# Patient Record
Sex: Female | Born: 1935 | Race: White | Hispanic: No | Smoking: Former smoker
Health system: Southern US, Community
[De-identification: ages and names within clinical notes are randomized; demographics above are authoritative.]

## PROBLEM LIST (undated history)

## (undated) DIAGNOSIS — I10 Essential (primary) hypertension: Secondary | ICD-10-CM

## (undated) DIAGNOSIS — N390 Urinary tract infection, site not specified: Secondary | ICD-10-CM

## (undated) DIAGNOSIS — K219 Gastro-esophageal reflux disease without esophagitis: Secondary | ICD-10-CM

## (undated) DIAGNOSIS — M1990 Unspecified osteoarthritis, unspecified site: Secondary | ICD-10-CM

## (undated) DIAGNOSIS — B159 Hepatitis A without hepatic coma: Secondary | ICD-10-CM

## (undated) DIAGNOSIS — F419 Anxiety disorder, unspecified: Secondary | ICD-10-CM

## (undated) DIAGNOSIS — I480 Paroxysmal atrial fibrillation: Secondary | ICD-10-CM

## (undated) DIAGNOSIS — I5042 Chronic combined systolic (congestive) and diastolic (congestive) heart failure: Secondary | ICD-10-CM

## (undated) DIAGNOSIS — K56609 Unspecified intestinal obstruction, unspecified as to partial versus complete obstruction: Secondary | ICD-10-CM

## (undated) DIAGNOSIS — R233 Spontaneous ecchymoses: Secondary | ICD-10-CM

## (undated) DIAGNOSIS — R238 Other skin changes: Secondary | ICD-10-CM

## (undated) HISTORY — DX: Hepatitis a without hepatic coma: B15.9

## (undated) HISTORY — DX: Unspecified osteoarthritis, unspecified site: M19.90

## (undated) HISTORY — PX: CATARACT EXTRACTION: SUR2

## (undated) HISTORY — DX: Anxiety disorder, unspecified: F41.9

## (undated) HISTORY — PX: DILATATION & CURRETTAGE/HYSTEROSCOPY WITH RESECTOCOPE: SHX5572

## (undated) HISTORY — DX: Unspecified intestinal obstruction, unspecified as to partial versus complete obstruction: K56.609

## (undated) HISTORY — DX: Urinary tract infection, site not specified: N39.0

---

## 1999-09-05 ENCOUNTER — Encounter: Admission: RE | Admit: 1999-09-05 | Discharge: 1999-09-05 | Payer: Self-pay | Admitting: Family Medicine

## 1999-09-05 ENCOUNTER — Encounter: Payer: Self-pay | Admitting: Family Medicine

## 2000-09-16 ENCOUNTER — Encounter: Admission: RE | Admit: 2000-09-16 | Discharge: 2000-09-16 | Payer: Self-pay | Admitting: Family Medicine

## 2000-09-16 ENCOUNTER — Encounter: Payer: Self-pay | Admitting: Family Medicine

## 2001-04-07 ENCOUNTER — Encounter: Admission: RE | Admit: 2001-04-07 | Discharge: 2001-04-07 | Payer: Self-pay | Admitting: Family Medicine

## 2001-04-07 ENCOUNTER — Encounter: Payer: Self-pay | Admitting: Family Medicine

## 2001-06-29 ENCOUNTER — Encounter: Admission: RE | Admit: 2001-06-29 | Discharge: 2001-06-29 | Payer: Self-pay | Admitting: Family Medicine

## 2001-06-29 ENCOUNTER — Encounter: Payer: Self-pay | Admitting: Family Medicine

## 2001-09-17 ENCOUNTER — Encounter: Admission: RE | Admit: 2001-09-17 | Discharge: 2001-09-17 | Payer: Self-pay | Admitting: Family Medicine

## 2001-09-17 ENCOUNTER — Encounter: Payer: Self-pay | Admitting: Family Medicine

## 2002-03-30 ENCOUNTER — Observation Stay (HOSPITAL_COMMUNITY): Admission: EM | Admit: 2002-03-30 | Discharge: 2002-03-31 | Payer: Self-pay

## 2002-03-30 ENCOUNTER — Encounter: Payer: Self-pay | Admitting: Cardiology

## 2002-03-31 ENCOUNTER — Encounter: Payer: Self-pay | Admitting: Cardiology

## 2002-09-20 ENCOUNTER — Encounter: Admission: RE | Admit: 2002-09-20 | Discharge: 2002-09-20 | Payer: Self-pay | Admitting: Family Medicine

## 2002-09-20 ENCOUNTER — Encounter: Payer: Self-pay | Admitting: Family Medicine

## 2003-06-05 ENCOUNTER — Encounter: Admission: RE | Admit: 2003-06-05 | Discharge: 2003-06-05 | Payer: Self-pay | Admitting: Family Medicine

## 2003-10-03 ENCOUNTER — Encounter (INDEPENDENT_AMBULATORY_CARE_PROVIDER_SITE_OTHER): Payer: Self-pay | Admitting: Cardiology

## 2003-10-03 ENCOUNTER — Ambulatory Visit (HOSPITAL_COMMUNITY): Admission: RE | Admit: 2003-10-03 | Discharge: 2003-10-03 | Payer: Self-pay | Admitting: Cardiology

## 2003-11-16 ENCOUNTER — Encounter: Admission: RE | Admit: 2003-11-16 | Discharge: 2003-11-16 | Payer: Self-pay | Admitting: Family Medicine

## 2003-11-26 ENCOUNTER — Emergency Department (HOSPITAL_COMMUNITY): Admission: EM | Admit: 2003-11-26 | Discharge: 2003-11-26 | Payer: Self-pay | Admitting: *Deleted

## 2003-12-12 ENCOUNTER — Encounter: Admission: RE | Admit: 2003-12-12 | Discharge: 2003-12-12 | Payer: Self-pay | Admitting: Family Medicine

## 2003-12-14 ENCOUNTER — Encounter: Admission: RE | Admit: 2003-12-14 | Discharge: 2003-12-14 | Payer: Self-pay | Admitting: Family Medicine

## 2003-12-20 ENCOUNTER — Encounter: Admission: RE | Admit: 2003-12-20 | Discharge: 2003-12-20 | Payer: Self-pay | Admitting: Family Medicine

## 2004-03-25 ENCOUNTER — Encounter: Admission: RE | Admit: 2004-03-25 | Discharge: 2004-03-25 | Payer: Self-pay | Admitting: Family Medicine

## 2004-04-23 ENCOUNTER — Encounter: Admission: RE | Admit: 2004-04-23 | Discharge: 2004-04-23 | Payer: Self-pay | Admitting: Family Medicine

## 2004-05-09 ENCOUNTER — Ambulatory Visit (HOSPITAL_COMMUNITY): Admission: RE | Admit: 2004-05-09 | Discharge: 2004-05-09 | Payer: Self-pay | Admitting: Family Medicine

## 2004-05-09 ENCOUNTER — Encounter (INDEPENDENT_AMBULATORY_CARE_PROVIDER_SITE_OTHER): Payer: Self-pay | Admitting: Specialist

## 2004-10-11 ENCOUNTER — Encounter: Admission: RE | Admit: 2004-10-11 | Discharge: 2004-10-11 | Payer: Self-pay | Admitting: Family Medicine

## 2005-01-01 ENCOUNTER — Encounter: Admission: RE | Admit: 2005-01-01 | Discharge: 2005-01-01 | Payer: Self-pay | Admitting: Family Medicine

## 2005-11-10 ENCOUNTER — Encounter: Admission: RE | Admit: 2005-11-10 | Discharge: 2005-11-10 | Payer: Self-pay | Admitting: Family Medicine

## 2006-02-05 ENCOUNTER — Encounter: Admission: RE | Admit: 2006-02-05 | Discharge: 2006-02-05 | Payer: Self-pay | Admitting: Family Medicine

## 2006-05-28 ENCOUNTER — Encounter: Admission: RE | Admit: 2006-05-28 | Discharge: 2006-05-28 | Payer: Self-pay | Admitting: Family Medicine

## 2008-10-30 ENCOUNTER — Emergency Department (HOSPITAL_COMMUNITY): Admission: EM | Admit: 2008-10-30 | Discharge: 2008-10-30 | Payer: Self-pay | Admitting: Family Medicine

## 2010-09-13 NOTE — Discharge Summary (Signed)
[name], VANDERWEELE                          ACCOUNT NO.:  [id number]   MEDICAL RECORD NO.:  [id number]                   PATIENT TYPE:  INP   LOCATION:  2003                                 FACILITY:  [facility]   PHYSICIAN:  Darden Palmer., M.D.         DATE OF BIRTH:  1921-02-19   DATE OF ADMISSION:  03/30/2002  DATE OF DISCHARGE:  03/31/2002                                 DISCHARGE SUMMARY   FINAL DIAGNOSES:  1. Accelerated blood pressure resolved.  2. Atypical palpitations and malaise and fatigue of uncertain cause.  3. Abnormal EKG likely due to baseline artifact - myocardial infarction     ruled out.  Cardiolite scan unremarkable.  4. Anxiety.  5. Osteoarthritis and cervical osteoarthritis.   PROCEDURES:  Carotid Doppler scan and stress Cardiolite.   HISTORY OF PRESENT ILLNESS:  This 77 year old female has a history of  arrhythmias treated with beta-blockers previously.  She has complained of  significant neck and shoulder pain and has had increased anxiety and stress.  She has also complained of some malaise and fatigue recently.  She had not  felt well in some time and awoke feeling well the morning of admission but  then felt weird with increased shortness of breath and irregular heart  rate. She took her blood pressure and noted it to be elevated and then went  to Dr. Quita Skye office feeling very anxious and shaking.  While there, he  did an EKG showing ST elevation that was reported in the anterior leads and  she was sent to the emergency room.  At no time did she have any chest pain,  pressure, tightness or heaviness.  Repeat EKG here did not show ST elevation  and she was admitted to rule out a myocardial infarction.  Please see the  previously dictated history and physical for the remainder of the details.   HOSPITAL COURSE:  The patient was admitted to the telemetry floor and she  was given a dose of Norvasc and her blood pressure medicines were  increased.  With this, her systolic blood pressure came down to the 150 range.  Laboratory data on admission showed a hemoglobin of 13.1, hematocrit 38,  platelet count of 247,000.  Chemistry panel showed a sodium of 130,  potassium 3.6, chloride 95, CO2 25, BUN 10, creatinine 0.5 and glucose of  135.  PT and PTT were normal.  Liver enzymes were normal.  Cardiac enzymes  showed normal CPK, MB was 3.7, initially was 4.1 and 4.5 which were  borderline, troponin was 0.01, 0.03 and 0.02 which were within normal  limits.  Cholesterol 212, triglycerides 80, HDL 72, ratio 2.0, LDL  cholesterol 124 and VLDL was 16.  TSH was 1.5.   The patient was admitted and underwent treatment for her blood pressure. She  did not have recurrent chest pain at any time.  Serial EKGs failed to evolve  a myocardial infarction.  Her blood pressure was better controlled the next  day but she continued to complain of neck pain and was sent down for  cervical spine films which are pending at the time of dictation.  She had an  adenosine Cardiolite scan done the next day which showed an ejection  fraction of 74%. There were no wall motion abnormalities and there was no  evidence of ischemia.  Carotid Doppler studies were done because of some  faint bruits that were noted and showed tortuous carotids but only mild  luminal irregularity within the carotid arteries.   It was recommended that she be discharged the next day.  She continues to  have some anxiety and does have significant pain involving her neck with  motion in her shoulders.  We felt at the present time that she did not show  a high risk pattern for coronary disease.   CONDITION ON DISCHARGE:  She was discharged in improved condition.   DISCHARGE MEDICATIONS:  1. Hydrochlorothiazide 25 mg daily.  2. Atenolol 50 mg daily.  3. Norvasc 5/20 b.i.d.  4. She is also to use meprobamate and Fiorinal as needed for pain and for     headache.   She also has  severe osteoarthritis of her knees and it would be helpful if  she could benefit in some exercises and is advised to lose weight.   FOLLOW UP:  I will see her in follow-up in one week and she is to follow up  further with Dr. Clovis Riley.  She likely will require higher doses of  medications for control of her blood pressure with an ideal blood pressure  of 130/80 her last.                                               W. Ashley Royalty., M.D.    WST/MEDQ  D:  03/31/2002  T:  04/01/2002  Job:  [id number]   cc:   L. Lupe Carney, M.D.  [address] [place]  [place] [address]  Fax: [phone]

## 2010-09-13 NOTE — H&P (Signed)
[name], [name]                          ACCOUNT NO.:  [id number]   MEDICAL RECORD NO.:  [id number]                   PATIENT TYPE:  EMS   LOCATION:  MINO                                 FACILITY:  [facility]   PHYSICIAN:  W. Ashley Royalty., M.D.         DATE OF BIRTH:  June 21, 1920   DATE OF ADMISSION:  03/30/2002  DATE OF DISCHARGE:                                HISTORY & PHYSICAL   HISTORY OF PRESENT ILLNESS:  This very nice, 77 year old female, is seen at  the request of Dr. Clovis Riley for cardiological evaluation regarding atypical  symptoms and an abnormal EKG.  She has a long-standing history of  hypertensive heart disease and was evaluated by me seven years ago with  palpitations which were thought due to supraventricular arrhythmias.  She  was treated with Beta blockers which have largely controlled her symptoms.  Over the past several months, she has had some increased dyspnea on exertion  and fatigue with exercise, and has also complained of neck pain which has  been more like a crick in her neck, and also mid back pain which would be  worse if she used her arms or did activity.  She has not had any significant  chest pain.  Her blood pressure has also been somewhat elevated recently and  she recently had an increase in her hydrochlorothiazide by Dr. Clovis Riley at  his office.   She awoke this morning and did not feel well after arising, and felt  weird.  She took her blood pressure and noted it to be significantly  elevated and noted a significant irregular pulse rate.  She felt poorly and  went to Dr. Quita Skye office where an EKG showed anterior ST elevation.  At  the time; however, she was not having any chest pain, pressure or tightness  suggestive of angina.  Dr. Quita Skye office had her come to the [facility]  Emergency Room where I have evaluated her, a repeat EKG here did not show ST  elevation anteriorly.  She is admitted at this time to rule out an atypical  presentation of a myocardial infarction and to treat elevated blood  pressure.   PAST HISTORY:  Remarkable for hypertension since her 30s.  There is no  history of diabetes or ulcers.  Her cholesterol status is unknown.   PREVIOUS SURGERY:  D&C x2.   ALLERGIES:  To sulfa.  She is also allergic to Neosporin and some eye drops.   CURRENT MEDICATIONS:  1. DynaCirc CR, 10 mg daily.  2. HCTZ 25 daily.  3. Atenolol 50 mg daily.   She also had previously been tried on Lotrel 5/10, _________ , Toprol and  Procardia for blood pressure, but is not currently taking any of these  medications.   FAMILY HISTORY:  Father died of a stroke at age 72, mother died of heart  failure at age 60, one brother living and well at 60.  No family history of  premature cardiac disease.   SOCIAL HISTORY:  The patient is a widow and currently lives alone.  She is a  non-smoker and does not use alcohol to excess.  She attends [employer].   REVIEW OF SYSTEMS:  GENERAL:  She has been obese for many years.  She has  chronic rosacea of her skin.  ENT:  She has a prior history of bilateral  cataract extractions, and also has had laser surgery and reports having a  cyst on the back of her left retina.  No eye, ear, nose or throat problem.  She has modest dyspnea on exertion, but no significant pulmonary problems.  She does have seasonal allergies that she would take Fiorinal for.  She does  have a history of anxiety for which she take meprobamate.  There are  significant gastrointestinal problems.  No claudication.  She has  significant arthritis of her knees and has been told that she needs a knee  replacement.  No neurologic symptoms or problems.  The remainder of the  review of systems is unremarkable.   PHYSICAL EXAMINATION:  GENERAL:  She is a pleasant, elderly woman, appearing  slightly younger than stated age.  VITAL SIGNS:  Her blood pressure is currently 200/85.  Pulse is currently  70  and regular.  SKIN:  Warm and dry with some erythema and rosacea of her face.  ENT:  Wears glasses.  EOMI, PERLA.  CNS:  Clear.  Funduscopic unremarkable.  Pharynx negative.  NECK:  Supple, without masses.  No JVD or thyromegaly.  There are bilateral  bruits noted.  LUNGS:  Clear bilaterally.  CARDIAC:  Shows normal S1 and S2, there is no S3, S4 or murmur.  ABDOMEN:  Soft, nontender, no masses or organomegaly.  No aneurysm or  bruits.  Femoral pulses are 2+ without bruits.  Peripheral pulses are 2+,  there is no edema noted.  NEUROLOGICAL:  Normal.  GU/RECTAL:  Deferred due to cardiac condition.   STUDIES:  12-lead ECG reviewed from Dr. Quita Skye office shows ST elevation  anteriorly with significant baseline artifact.  Repeat EKG did not show any  abnormality except for ST depression in the V2 and V3.   IMPRESSION:  1. Abnormal EKG possibly due to baseline artifact, rule out an atypical     presentation for myocardial infarction or ischemia.  2. Essential hypertension which is currently uncontrolled.  3. Obesity.  4. Significant osteoarthritis.   RECOMMENDATIONS:  She will be admitted to rule out a myocardial infarction.  We will add an Ace inhibitor and increase her blood pressure medicines at  this time.  Try to control blood pressure, rule out MI, check EKG and lipid  panel.                                                Darden Palmer., M.D.    WST/MEDQ  D:  03/30/2002  T:  03/30/2002  Job:  [id number]   cc:   L. Lupe Carney, M.D.  [address] [place]  [place] [address]  Fax: [phone]

## 2012-04-28 DIAGNOSIS — K56609 Unspecified intestinal obstruction, unspecified as to partial versus complete obstruction: Secondary | ICD-10-CM

## 2012-04-28 HISTORY — DX: Unspecified intestinal obstruction, unspecified as to partial versus complete obstruction: K56.609

## 2012-04-30 ENCOUNTER — Encounter (HOSPITAL_COMMUNITY): Payer: Self-pay | Admitting: Emergency Medicine

## 2012-04-30 ENCOUNTER — Inpatient Hospital Stay (HOSPITAL_COMMUNITY)
Admission: EM | Admit: 2012-04-30 | Discharge: 2012-05-17 | DRG: 388 | Disposition: A | Discharge status: Skilled Nursing Facility | Payer: Medicare Other | Attending: Internal Medicine | Admitting: Internal Medicine

## 2012-04-30 DIAGNOSIS — Z79899 Other long term (current) drug therapy: Secondary | ICD-10-CM

## 2012-04-30 DIAGNOSIS — J189 Pneumonia, unspecified organism: Secondary | ICD-10-CM | POA: Diagnosis present

## 2012-04-30 DIAGNOSIS — K589 Irritable bowel syndrome without diarrhea: Secondary | ICD-10-CM | POA: Diagnosis present

## 2012-04-30 DIAGNOSIS — E876 Hypokalemia: Secondary | ICD-10-CM | POA: Diagnosis present

## 2012-04-30 DIAGNOSIS — R339 Retention of urine, unspecified: Secondary | ICD-10-CM | POA: Diagnosis not present

## 2012-04-30 DIAGNOSIS — E871 Hypo-osmolality and hyponatremia: Secondary | ICD-10-CM | POA: Diagnosis present

## 2012-04-30 DIAGNOSIS — Z66 Do not resuscitate: Secondary | ICD-10-CM | POA: Diagnosis present

## 2012-04-30 DIAGNOSIS — K5900 Constipation, unspecified: Secondary | ICD-10-CM | POA: Diagnosis present

## 2012-04-30 DIAGNOSIS — W19XXXA Unspecified fall, initial encounter: Secondary | ICD-10-CM | POA: Diagnosis present

## 2012-04-30 DIAGNOSIS — Z7982 Long term (current) use of aspirin: Secondary | ICD-10-CM

## 2012-04-30 DIAGNOSIS — K56609 Unspecified intestinal obstruction, unspecified as to partial versus complete obstruction: Principal | ICD-10-CM | POA: Diagnosis present

## 2012-04-30 DIAGNOSIS — S9030XA Contusion of unspecified foot, initial encounter: Secondary | ICD-10-CM | POA: Diagnosis present

## 2012-04-30 DIAGNOSIS — N179 Acute kidney failure, unspecified: Secondary | ICD-10-CM | POA: Diagnosis not present

## 2012-04-30 DIAGNOSIS — D72829 Elevated white blood cell count, unspecified: Secondary | ICD-10-CM | POA: Diagnosis present

## 2012-04-30 DIAGNOSIS — Y95 Nosocomial condition: Secondary | ICD-10-CM | POA: Diagnosis present

## 2012-04-30 DIAGNOSIS — R1312 Dysphagia, oropharyngeal phase: Secondary | ICD-10-CM | POA: Diagnosis present

## 2012-04-30 DIAGNOSIS — B952 Enterococcus as the cause of diseases classified elsewhere: Secondary | ICD-10-CM | POA: Diagnosis present

## 2012-04-30 DIAGNOSIS — R490 Dysphonia: Secondary | ICD-10-CM | POA: Diagnosis present

## 2012-04-30 DIAGNOSIS — I10 Essential (primary) hypertension: Secondary | ICD-10-CM | POA: Diagnosis present

## 2012-04-30 DIAGNOSIS — R7881 Bacteremia: Secondary | ICD-10-CM | POA: Diagnosis present

## 2012-04-30 DIAGNOSIS — N390 Urinary tract infection, site not specified: Secondary | ICD-10-CM | POA: Diagnosis present

## 2012-04-30 DIAGNOSIS — B967 Clostridium perfringens [C. perfringens] as the cause of diseases classified elsewhere: Secondary | ICD-10-CM | POA: Diagnosis present

## 2012-04-30 DIAGNOSIS — R197 Diarrhea, unspecified: Secondary | ICD-10-CM | POA: Diagnosis not present

## 2012-04-30 DIAGNOSIS — E870 Hyperosmolality and hypernatremia: Secondary | ICD-10-CM | POA: Diagnosis present

## 2012-04-30 DIAGNOSIS — A498 Other bacterial infections of unspecified site: Secondary | ICD-10-CM | POA: Diagnosis present

## 2012-04-30 HISTORY — DX: Essential (primary) hypertension: I10

## 2012-04-30 MED ORDER — SODIUM CHLORIDE 0.9 % IV BOLUS (SEPSIS)
500.0000 mL | Freq: Once | INTRAVENOUS | Status: AC
  Administered 2012-05-01: 500 mL via INTRAVENOUS

## 2012-04-30 NOTE — ED Notes (Signed)
MD at bedside. 

## 2012-04-30 NOTE — ED Notes (Signed)
Per family n/v since 1600 today. Daughter gave Phenergan PR @ 2000 tonight

## 2012-04-30 NOTE — ED Provider Notes (Signed)
History     CSN: [id number]  Arrival date & time 04/30/12  2220   First MD Initiated Contact with Patient 04/30/12 2339      Chief Complaint  Patient presents with  . Emesis    (Consider location/radiation/quality/duration/timing/severity/associated sxs/prior treatment) Patient is a 79 y.o. female presenting with vomiting. The history is provided by the patient.  Emesis  This is a new problem. Pertinent negatives include no abdominal pain, no diarrhea and no headaches.   patient presents with vomiting. It began around 4:00 today. No diarrhea. No constipation her son states her abdomen is gotten a little larger. Patient feels it is not. No chest pain. No lightheadedness or dizziness. She does feel a little fatigued. No dysuria. No frequency. No headache. No confusion. No clear sick contacts. She did have a fall around Christmastime and has swelling on her right foot since. She was given Phenergan rectally tonight and still has some nausea.  Past Medical History  Diagnosis Date  . Hypertension   . Irregular heart beat     Past Surgical History  Procedure Date  . Cataract extraction     Family History  Problem Relation Age of Onset  . Heart disease Mother   . Stroke Father     History  Substance Use Topics  . Smoking status: Never Smoker   . Smokeless tobacco: Not on file  . Alcohol Use: No    OB History    Grav Para Term Preterm Abortions TAB SAB Ect Mult Living                  Review of Systems  Constitutional: Positive for fatigue. Negative for activity change and appetite change.  HENT: Negative for neck stiffness.   Eyes: Negative for pain.  Respiratory: Negative for chest tightness and shortness of breath.   Cardiovascular: Negative for chest pain and leg swelling.  Gastrointestinal: Positive for nausea and vomiting. Negative for abdominal pain and diarrhea.  Genitourinary: Negative for flank pain.  Musculoskeletal: Negative for back pain.  Skin: Negative  for rash.  Neurological: Negative for weakness, numbness and headaches.  Psychiatric/Behavioral: Negative for behavioral problems.    Allergies  Neosporin and Sulfa antibiotics  Home Medications   Current Outpatient Rx  Name  Route  Sig  Dispense  Refill  . ALPRAZOLAM 0.5 MG PO TABS   Oral   Take 0.25 mg by mouth 2 (two) times daily as needed. anxiety         . AMLODIPINE BESYLATE PO   Oral   Take 5 mg by mouth daily.          . ASPIRIN 81 MG PO TABS   Oral   Take 81 mg by mouth daily.         . ATENOLOL PO   Oral   Take 50 mg by mouth 2 (two) times daily.          Marland Kitchen GLUCOSAMINE COMPLEX PO   Oral   Take 1 tablet by mouth daily.          Marland Kitchen CETIRIZINE HCL 10 MG PO TABS   Oral   Take 10 mg by mouth daily.         Marland Kitchen VITAMIN B 12 PO   Oral   Take by mouth.         Marland Kitchen HYDROCODONE-ACETAMINOPHEN 5-325 MG PO TABS   Oral   Take 1 tablet by mouth every 6 (six) hours as needed. pain         .  SPIRONOLACTONE-HCTZ 25-25 MG PO TABS   Oral   Take 1 tablet by mouth daily.         Marland Kitchen DIOVAN PO   Oral   Take 160 mg by mouth daily.            BP 124/66  Pulse 101  Temp 99.7 F (37.6 C) (Oral)  Resp 27  Ht 5\' 4"  (1.626 m)  Wt 145 lb (65.772 kg)  BMI 24.89 kg/m2  SpO2 98%  Physical Exam  Nursing note and vitals reviewed. Constitutional: She is oriented to person, place, and time. She appears well-developed and well-nourished.  HENT:  Head: Normocephalic and atraumatic.  Eyes: EOM are normal. Pupils are equal, round, and reactive to light.  Neck: Normal range of motion. Neck supple.  Cardiovascular: Normal rate, regular rhythm and normal heart sounds.   No murmur heard. Pulmonary/Chest: Effort normal and breath sounds normal. No respiratory distress. She has no wheezes. She has no rales.  Abdominal: Soft. Bowel sounds are normal. She exhibits distension. There is no tenderness. There is no rebound and no guarding.       Mild distention    Musculoskeletal: Normal range of motion.       Swelling to dorsum of right foot. Bruising. Strength intact. Motor intact.  Neurological: She is alert and oriented to person, place, and time. No cranial nerve deficit.  Skin: Skin is warm and dry.  Psychiatric: She has a normal mood and affect. Her speech is normal.    ED Course  Procedures (including critical care time)  Labs Reviewed  CBC WITH DIFFERENTIAL - Abnormal; Notable for the following:    WBC 15.1 (*)     HCT 35.1 (*)     Neutrophils Relative 78 (*)     Neutro Abs 11.8 (*)     Lymphocytes Relative 7 (*)     Monocytes Relative 15 (*)     Monocytes Absolute 2.3 (*)     All other components within normal limits  URINALYSIS, ROUTINE W REFLEX MICROSCOPIC - Abnormal; Notable for the following:    Protein, ur 100 (*)     Leukocytes, UA TRACE (*)     All other components within normal limits  COMPREHENSIVE METABOLIC PANEL - Abnormal; Notable for the following:    Sodium 124 (*)     Chloride 86 (*)     Glucose, Bld 187 (*)     Total Protein 8.4 (*)     GFR calc non Af Amer 73 (*)     GFR calc Af Amer 85 (*)     All other components within normal limits  URINE MICROSCOPIC-ADD ON - Abnormal; Notable for the following:    Squamous Epithelial / LPF FEW (*)     All other components within normal limits  LIPASE, BLOOD  TROPONIN I   Dg Chest 2 View  05/01/2012  *RADIOLOGY REPORT*  Clinical Data: Emesis, question aspiration  CHEST - 2 VIEW  Comparison: Prior radiograph of 05/01/2012  Findings: Nasogastric tube extends into stomach. Enlargement of cardiac silhouette. Calcified left upper lobe nodular density again seen. No new infiltrate or pleural effusion. Atherosclerotic calcification of a mildly tortuous thoracic aorta. Bones demineralized.  IMPRESSION: No new infiltrates identified. Enlargement of cardiac silhouette. Old granulomatous disease.   Original Report Authenticated By: Ulyses Southward, M.D.    Ct Abdomen Pelvis W  Contrast  05/01/2012  *RADIOLOGY REPORT*  Clinical Data: Nausea, vomiting, question small bowel obstruction, abnormal radiographs  CT ABDOMEN AND  PELVIS WITH CONTRAST  Technique:  Multidetector CT imaging of the abdomen and pelvis was performed following the standard protocol during bolus administration of intravenous contrast. Sagittal and coronal MPR images reconstructed from axial data set.  Contrast: OMNIPAQUE IOHEXOL 300 MG/ML  SOLN Dilute oral contrast.  Comparison: Abdominal radiographs 05/01/2012  Findings: Minimal atelectasis right lung base. Moderate sized hiatal hernia. Small cyst upper pole right kidney. Liver, spleen, pancreas, kidneys, and adrenal glands otherwise unremarkable. Dilatation of stomach and proximal small bowel loops with decompressed distal small bowel loops compatible with small bowel obstruction. Dilatation of small bowel loops terminates in the upper mid pelvis anteriorly image 62. Small bowel stool sign is identified within the dilated distal small bowel loops. Question minimal bowel wall thickening at the point of obstruction. Stool is present throughout the colon and rectum.  No additional bowel wall thickening, free fluid, or free air. Unremarkable bladder, ureters, and adnexae. Small fat containing lipoleiomyoma within uterus. Normal appendix. No mass, adenopathy or hernia. Bones appear demineralized. Scattered atherosclerotic calcifications. Degenerative disc disease changes lumbar spine with superior endplate compression fracture of T11, age indeterminate.  IMPRESSION: Small bowel obstruction with transition zone in the anterior central pelvis, associated with bowel wall thickening at the point of obstruction. This could represent an adhesion though subtle mass lesion is not excluded. Increased stool throughout colon. Moderate sized hiatal hernia.   Original Report Authenticated By: Ulyses Southward, M.D.    Dg Abd Acute W/chest  05/01/2012  *RADIOLOGY REPORT*  Clinical Data:  Nausea, vomiting, abdominal pain  ACUTE ABDOMEN SERIES (ABDOMEN 2 VIEW & CHEST 1 VIEW)  Comparison: Chest radiograph 05/28/2006 Correlation:  CT chest 11/10/2005  Findings: Mild enlargement of cardiac silhouette. Atherosclerotic ossification aorta. Mediastinal contours and pulmonary vascularity otherwise normal. Emphysematous and bronchitic changes consistent with COPD. Partially calcified anterior left upper lobe nodule, little changed. No acute infiltrate, pleural effusion, or pneumothorax.  Bones appear demineralized. Dilated air-filled small bowel loops with scattered air-fluid levels on left lateral decubitus view question small bowel obstruction. Scattered stool present throughout the colon to the rectum. No definite free intraperitoneal air or definite urinary tract calcification.  IMPRESSION: Dilated air-filled loops of small bowel with scattered air-fluid levels on left lateral decubitus view question small bowel obstruction. However, stool is present throughout colon to rectum. Question COPD with old calcified granuloma left upper lobe.   Original Report Authenticated By: Ulyses Southward, M.D.    Dg Foot Complete Right  05/01/2012  *RADIOLOGY REPORT*  Clinical Data: Pain, bruising and swelling at dorsal midfoot post fall  RIGHT FOOT COMPLETE - 3+ VIEW  Comparison: None  Findings: Osseous demineralization. Minimal hallux valgus. Degenerative changes first MTP joint. Remaining joint spaces preserved. Small plantar calcaneal spur. Dorsal soft tissue swelling at mid foot. No definite acute fracture, dislocation, or bone destruction. Scattered atherosclerotic calcifications.  IMPRESSION: Osseous demineralization. Hallux valgus with mild degenerative changes first MTP joint. Dorsal soft tissue swelling midfoot without definite acute bony abnormalities.   Original Report Authenticated By: Ulyses Southward, M.D.      1. Small bowel obstruction   2. HTN (hypertension)   3. Hyponatremia     Date: 05/01/2012  Rate:  62  Rhythm: normal sinus rhythm and premature ventricular contractions (PVC)  QRS Axis: normal  Intervals: normal  ST/T Wave abnormalities: normal  Conduction Disutrbances:none  Narrative Interpretation:   Old EKG Reviewed: none available     MDM  Patient has nausea and vomiting. Found to have a small bowel obstruction on  x-ray. CT shows small bowel obstruction with transition point pelvis. No previous abdominal surgery. Patient will be admitted to medicine after consultation with general surgery        Juliet Rude. Rubin Payor, MD 05/01/12 [phone]

## 2012-05-01 ENCOUNTER — Encounter (HOSPITAL_COMMUNITY): Payer: Self-pay | Admitting: Internal Medicine

## 2012-05-01 ENCOUNTER — Emergency Department (HOSPITAL_COMMUNITY): Payer: Medicare Other

## 2012-05-01 ENCOUNTER — Observation Stay (HOSPITAL_COMMUNITY): Payer: Medicare Other

## 2012-05-01 DIAGNOSIS — K56609 Unspecified intestinal obstruction, unspecified as to partial versus complete obstruction: Secondary | ICD-10-CM

## 2012-05-01 DIAGNOSIS — S9030XA Contusion of unspecified foot, initial encounter: Secondary | ICD-10-CM

## 2012-05-01 DIAGNOSIS — I10 Essential (primary) hypertension: Secondary | ICD-10-CM

## 2012-05-01 DIAGNOSIS — E871 Hypo-osmolality and hyponatremia: Secondary | ICD-10-CM

## 2012-05-01 LAB — COMPREHENSIVE METABOLIC PANEL
BUN: 18 mg/dL (ref 6–23)
BUN: 19 mg/dL (ref 6–23)
CO2: 20 mEq/L (ref 19–32)
CO2: 25 mEq/L (ref 19–32)
Calcium: 9 mg/dL (ref 8.4–10.5)
Calcium: 9.9 mg/dL (ref 8.4–10.5)
Creatinine, Ser: 0.74 mg/dL (ref 0.50–1.10)
Creatinine, Ser: 0.93 mg/dL (ref 0.50–1.10)
GFR calc Af Amer: 61 mL/min — ABNORMAL LOW (ref >90)
GFR calc Af Amer: 85 mL/min — ABNORMAL LOW (ref >90)
GFR calc non Af Amer: 53 mL/min — ABNORMAL LOW (ref >90)
GFR calc non Af Amer: 73 mL/min — ABNORMAL LOW (ref >90)
Glucose, Bld: 128 mg/dL — ABNORMAL HIGH (ref 70–99)
Glucose, Bld: 187 mg/dL — ABNORMAL HIGH (ref 70–99)
Total Bilirubin: 0.7 mg/dL (ref 0.3–1.2)
Total Bilirubin: 0.9 mg/dL (ref 0.3–1.2)

## 2012-05-01 LAB — URINALYSIS, ROUTINE W REFLEX MICROSCOPIC
Bilirubin Urine: NEGATIVE
Glucose, UA: NEGATIVE mg/dL
Hgb urine dipstick: NEGATIVE
Ketones, ur: NEGATIVE mg/dL
Protein, ur: 100 mg/dL — AB

## 2012-05-01 LAB — CBC WITH DIFFERENTIAL/PLATELET
Eosinophils Absolute: 0 10*3/uL (ref 0.0–0.7)
Eosinophils Relative: 0 % (ref 0–5)
Lymphs Abs: 1.1 10*3/uL (ref 0.7–4.0)
MCH: 28.3 pg (ref 26.0–34.0)
MCV: 82.8 fL (ref 78.0–100.0)
Monocytes Absolute: 2.3 10*3/uL — ABNORMAL HIGH (ref 0.1–1.0)
Platelets: 190 10*3/uL (ref 150–400)
RBC: 4.24 MIL/uL (ref 3.87–5.11)

## 2012-05-01 LAB — CBC
Hemoglobin: 11.7 g/dL — ABNORMAL LOW (ref 12.0–15.0)
Platelets: 172 10*3/uL (ref 150–400)
RBC: 4.11 MIL/uL (ref 3.87–5.11)
WBC: 33.3 10*3/uL — ABNORMAL HIGH (ref 4.0–10.5)

## 2012-05-01 LAB — TROPONIN I: Troponin I: <0.3 ng/mL (ref <0.30)

## 2012-05-01 LAB — URINE MICROSCOPIC-ADD ON

## 2012-05-01 LAB — TSH: TSH: 2.374 u[IU]/mL (ref 0.350–4.500)

## 2012-05-01 MED ORDER — ACETAMINOPHEN 325 MG PO TABS
650.0000 mg | ORAL_TABLET | Freq: Four times a day (QID) | ORAL | Status: DC | PRN
  Administered 2012-05-09 – 2012-05-16 (×7): 650 mg via ORAL
  Filled 2012-05-01 (×7): qty 2

## 2012-05-01 MED ORDER — METOPROLOL TARTRATE 1 MG/ML IV SOLN
2.5000 mg | Freq: Four times a day (QID) | INTRAVENOUS | Status: DC
  Administered 2012-05-03 – 2012-05-08 (×18): 2.5 mg via INTRAVENOUS
  Filled 2012-05-01 (×31): qty 5

## 2012-05-01 MED ORDER — ONDANSETRON HCL 4 MG PO TABS
4.0000 mg | ORAL_TABLET | Freq: Four times a day (QID) | ORAL | Status: DC | PRN
  Administered 2012-05-01: 4 mg via ORAL

## 2012-05-01 MED ORDER — SODIUM CHLORIDE 0.9 % IV SOLN
INTRAVENOUS | Status: AC
  Administered 2012-05-01: 100 mL/h via INTRAVENOUS

## 2012-05-01 MED ORDER — HYDRALAZINE HCL 20 MG/ML IJ SOLN: 10.0000 mg | INTRAMUSCULAR | Status: DC | PRN

## 2012-05-01 MED ORDER — PANTOPRAZOLE SODIUM 40 MG IV SOLR
40.0000 mg | INTRAVENOUS | Status: DC
  Administered 2012-05-01 – 2012-05-02 (×2): 40 mg via INTRAVENOUS
  Filled 2012-05-01 (×3): qty 40

## 2012-05-01 MED ORDER — IOHEXOL 300 MG/ML  SOLN
100.0000 mL | Freq: Once | INTRAMUSCULAR | Status: AC | PRN
  Administered 2012-05-01: 100 mL via INTRAVENOUS

## 2012-05-01 MED ORDER — ONDANSETRON HCL 4 MG/2ML IJ SOLN
4.0000 mg | Freq: Once | INTRAMUSCULAR | Status: AC
  Administered 2012-05-01: 4 mg via INTRAVENOUS
  Filled 2012-05-01: qty 2

## 2012-05-01 MED ORDER — ACETAMINOPHEN 650 MG RE SUPP: 650.0000 mg | Freq: Four times a day (QID) | RECTAL | Status: DC | PRN

## 2012-05-01 MED ORDER — SODIUM CHLORIDE 0.9 % IJ SOLN
3.0000 mL | Freq: Two times a day (BID) | INTRAMUSCULAR | Status: DC
  Administered 2012-05-02 – 2012-05-15 (×9): 3 mL via INTRAVENOUS

## 2012-05-01 MED ORDER — MORPHINE SULFATE 2 MG/ML IJ SOLN
1.0000 mg | INTRAMUSCULAR | Status: DC | PRN
  Administered 2012-05-09: 1 mg via INTRAVENOUS
  Filled 2012-05-01: qty 1

## 2012-05-01 MED ORDER — PROMETHAZINE HCL 25 MG/ML IJ SOLN
12.5000 mg | Freq: Once | INTRAMUSCULAR | Status: AC
  Administered 2012-05-01: 05:00:00 via INTRAVENOUS
  Filled 2012-05-01: qty 1

## 2012-05-01 MED ORDER — ONDANSETRON HCL 4 MG/2ML IJ SOLN
4.0000 mg | Freq: Four times a day (QID) | INTRAMUSCULAR | Status: DC | PRN
  Administered 2012-05-02: 4 mg via INTRAVENOUS
  Filled 2012-05-01 (×2): qty 2

## 2012-05-01 MED ORDER — SORBITOL 70 % SOLN
960.0000 mL | TOPICAL_OIL | Freq: Once | ORAL | Status: AC
  Administered 2012-05-01: 960 mL via RECTAL
  Filled 2012-05-01: qty 240

## 2012-05-01 MED ORDER — DOCUSATE SODIUM 100 MG PO CAPS
100.0000 mg | ORAL_CAPSULE | Freq: Two times a day (BID) | ORAL | Status: DC
  Administered 2012-05-03 – 2012-05-08 (×11): 100 mg via ORAL
  Filled 2012-05-01 (×18): qty 1

## 2012-05-01 MED ORDER — METOPROLOL TARTRATE 1 MG/ML IV SOLN
2.5000 mg | Freq: Four times a day (QID) | INTRAVENOUS | Status: DC
  Administered 2012-05-01: 13:00:00 via INTRAVENOUS
  Filled 2012-05-01: qty 5

## 2012-05-01 NOTE — Progress Notes (Signed)
Bladder scanned patient and there was 633 ml in bladder. Placed patient on bedpan and asked her to void. Will monitor if patient does not void.

## 2012-05-01 NOTE — Progress Notes (Signed)
   CARE MANAGEMENT NOTE 05/01/2012  Patient:  [name], [name]   Account Number:  [id number]  Date Initiated:  05/01/2012  Documentation initiated by:  PEARSON,COOKIE  Subjective/Objective Assessment:   pt admitted with cco abd pain, found to have SBO, NGT placed     Action/Plan:   from home   Anticipated DC Date:  05/04/2012   Anticipated DC Plan:  HOME/SELF CARE      DC Planning Services  CM consult      Choice offered to / List presented to:             Status of service:  In process, will continue to follow Medicare Important Message given?  NA - LOS <3 / Initial given by admissions (If response is "NO", the following Medicare IM given date fields will be blank) Date Medicare IM given:   Date Additional Medicare IM given:    Discharge Disposition:    Per UR Regulation:  Reviewed for med. necessity/level of care/duration of stay  If discussed at Long Length of Stay Meetings, dates discussed:    Comments:  05/01/12 MPearson, RN, BSN Chart reviewed.

## 2012-05-01 NOTE — ED Notes (Signed)
NGT 14 fr not functioning no drainage out after 40 min flushed with 40 ml NSS with no return when placed to suction> Dr Jearld [facility] into see pt and suggest NGT be change to large size 18 fr. 14 fr removed and 60fr NGT placed and 24 in at the left nare with return of 60 ml liquid brown fluid food mixed.  Pt tolerated procedure well and tube secured with nasal NGT clamp. Pt placed on 100% NRB mask d/t decreased sat  And recovered quickly with Vital signs returning to [facility]

## 2012-05-01 NOTE — ED Notes (Signed)
Patient transported to CT 

## 2012-05-01 NOTE — Consult Note (Signed)
Reason for Consult:SBO Referring Physician: [name] is an 79 y.o. female.  HPI:  Pt is 79 yo F with around 12 hours of abdominal pain and nausea/vomiting.  She lives alone, and is visited frequently by relatives and friends, and normally is very astute, caring for herself.  History is obtained via son.  She was doing fine on Wednesday without vomiting.  She was in her usual state of health then.  She called yesterday at 4 pm with acute complaints of nausea/vomiting and abdominal pain.  She denies pain now.  She has a history of IBS.    Past Medical History  Diagnosis Date  . Hypertension   . Irregular heart beat     Past Surgical History  Procedure Date  . Cataract extraction     Family History  Problem Relation Age of Onset  . Heart disease Mother   . Stroke Father     Social History:  reports that she has never smoked. She does not have any smokeless tobacco history on file. She reports that she does not drink alcohol or use illicit drugs.  Allergies:  Allergies  Allergen Reactions  . Neosporin (Neomycin-Bacitracin Zn-Polymyx)   . Sulfa Antibiotics     Medications:  MedicationsLong-Term  Prescriptions Show Facility-Administered Medications    ALPRAZolam (XANAX) 0.5 MG tablet   AMLODIPINE BESYLATE PO   aspirin 81 MG tablet   ATENOLOL PO   Boswellia-Glucosamine-Vit D (GLUCOSAMINE COMPLEX PO)   cetirizine (ZYRTEC) 10 MG tablet   Cyanocobalamin (VITAMIN B 12 PO)   HYDROcodone-acetaminophen (NORCO/VICODIN) 5-325 MG per tablet   spironolactone-hydrochlorothiazide (ALDACTAZIDE) 25-25 MG per tablet   Valsartan (DIOVAN PO)     Results for orders placed during the hospital encounter of 04/30/12 (from the past 48 hour(s))  CBC WITH DIFFERENTIAL     Status: Abnormal   Collection Time   05/01/12 12:15 AM      Component Value Range Comment   WBC 15.1 (*) 4.0 - 10.5 K/uL    RBC 4.24  3.87 - 5.11 MIL/uL    Hemoglobin 12.0  12.0 - 15.0 g/dL    HCT 16.1 (*)  09.6 - 46.0 %    MCV 82.8  78.0 - 100.0 fL    MCH 28.3  26.0 - 34.0 pg    MCHC 34.2  30.0 - 36.0 g/dL    RDW 04.5  40.9 - 81.1 %    Platelets 190  150 - 400 K/uL    Neutrophils Relative 78 (*) 43 - 77 %    Neutro Abs 11.8 (*) 1.7 - 7.7 K/uL    Lymphocytes Relative 7 (*) 12 - 46 %    Lymphs Abs 1.1  0.7 - 4.0 K/uL    Monocytes Relative 15 (*) 3 - 12 %    Monocytes Absolute 2.3 (*) 0.1 - 1.0 K/uL    Eosinophils Relative 0  0 - 5 %    Eosinophils Absolute 0.0  0.0 - 0.7 K/uL    Basophils Relative 0  0 - 1 %    Basophils Absolute 0.0  0.0 - 0.1 K/uL   COMPREHENSIVE METABOLIC PANEL     Status: Abnormal   Collection Time   05/01/12 12:15 AM      Component Value Range Comment   Sodium 124 (*) 135 - 145 mEq/L    Potassium 4.3  3.5 - 5.1 mEq/L    Chloride 86 (*) 96 - 112 mEq/L    CO2 25  19 -  32 mEq/L    Glucose, Bld 187 (*) 70 - 99 mg/dL    BUN 18  6 - 23 mg/dL    Creatinine, Ser 4.09  0.50 - 1.10 mg/dL    Calcium 9.9  8.4 - 81.1 mg/dL    Total Protein 8.4 (*) 6.0 - 8.3 g/dL    Albumin 4.3  3.5 - 5.2 g/dL    AST 28  0 - 37 U/L    ALT 14  0 - 35 U/L    Alkaline Phosphatase 86  39 - 117 U/L    Total Bilirubin 0.7  0.3 - 1.2 mg/dL    GFR calc non Af Amer 73 (*) >90 mL/min    GFR calc Af Amer 85 (*) >90 mL/min   LIPASE, BLOOD     Status: Normal   Collection Time   05/01/12 12:15 AM      Component Value Range Comment   Lipase 32  11 - 59 U/L   TROPONIN I     Status: Normal   Collection Time   05/01/12 12:15 AM      Component Value Range Comment   Troponin I <0.30  <0.30 ng/mL   URINALYSIS, ROUTINE W REFLEX MICROSCOPIC     Status: Abnormal   Collection Time   05/01/12  2:00 AM      Component Value Range Comment   Color, Urine YELLOW  YELLOW    APPearance CLEAR  CLEAR    Specific Gravity, Urine 1.017  1.005 - 1.030    pH 7.5  5.0 - 8.0    Glucose, UA NEGATIVE  NEGATIVE mg/dL    Hgb urine dipstick NEGATIVE  NEGATIVE    Bilirubin Urine NEGATIVE  NEGATIVE    Ketones, ur NEGATIVE   NEGATIVE mg/dL    Protein, ur 914 (*) NEGATIVE mg/dL    Urobilinogen, UA 1.0  0.0 - 1.0 mg/dL    Nitrite NEGATIVE  NEGATIVE    Leukocytes, UA TRACE (*) NEGATIVE   URINE MICROSCOPIC-ADD ON     Status: Abnormal   Collection Time   05/01/12  2:00 AM      Component Value Range Comment   Squamous Epithelial / LPF FEW (*) RARE    WBC, UA 0-2  <3 WBC/hpf    Bacteria, UA RARE  RARE     Dg Chest 2 View  05/01/2012  *RADIOLOGY REPORT*  Clinical Data: Emesis, question aspiration  CHEST - 2 VIEW  Comparison: Prior radiograph of 05/01/2012  Findings: Nasogastric tube extends into stomach. Enlargement of cardiac silhouette. Calcified left upper lobe nodular density again seen. No new infiltrate or pleural effusion. Atherosclerotic calcification of a mildly tortuous thoracic aorta. Bones demineralized.  IMPRESSION: No new infiltrates identified. Enlargement of cardiac silhouette. Old granulomatous disease.   Original Report Authenticated By: Ulyses Southward, M.D.    Ct Abdomen Pelvis W Contrast  05/01/2012  *RADIOLOGY REPORT*  Clinical Data: Nausea, vomiting, question small bowel obstruction, abnormal radiographs  CT ABDOMEN AND PELVIS WITH CONTRAST  Technique:  Multidetector CT imaging of the abdomen and pelvis was performed following the standard protocol during bolus administration of intravenous contrast. Sagittal and coronal MPR images reconstructed from axial data set.  Contrast: OMNIPAQUE IOHEXOL 300 MG/ML  SOLN Dilute oral contrast.  Comparison: Abdominal radiographs 05/01/2012  Findings: Minimal atelectasis right lung base. Moderate sized hiatal hernia. Small cyst upper pole right kidney. Liver, spleen, pancreas, kidneys, and adrenal glands otherwise unremarkable. Dilatation of stomach and proximal small bowel loops with decompressed  distal small bowel loops compatible with small bowel obstruction. Dilatation of small bowel loops terminates in the upper mid pelvis anteriorly image 62. Small bowel stool sign  is identified within the dilated distal small bowel loops. Question minimal bowel wall thickening at the point of obstruction. Stool is present throughout the colon and rectum.  No additional bowel wall thickening, free fluid, or free air. Unremarkable bladder, ureters, and adnexae. Small fat containing lipoleiomyoma within uterus. Normal appendix. No mass, adenopathy or hernia. Bones appear demineralized. Scattered atherosclerotic calcifications. Degenerative disc disease changes lumbar spine with superior endplate compression fracture of T11, age indeterminate.  IMPRESSION: Small bowel obstruction with transition zone in the anterior central pelvis, associated with bowel wall thickening at the point of obstruction. This could represent an adhesion though subtle mass lesion is not excluded. Increased stool throughout colon. Moderate sized hiatal hernia.   Original Report Authenticated By: Ulyses Southward, M.D.    Dg Abd Acute W/chest  05/01/2012  *RADIOLOGY REPORT*  Clinical Data: Nausea, vomiting, abdominal pain  ACUTE ABDOMEN SERIES (ABDOMEN 2 VIEW & CHEST 1 VIEW)  Comparison: Chest radiograph 05/28/2006 Correlation:  CT chest 11/10/2005  Findings: Mild enlargement of cardiac silhouette. Atherosclerotic ossification aorta. Mediastinal contours and pulmonary vascularity otherwise normal. Emphysematous and bronchitic changes consistent with COPD. Partially calcified anterior left upper lobe nodule, little changed. No acute infiltrate, pleural effusion, or pneumothorax.  Bones appear demineralized. Dilated air-filled small bowel loops with scattered air-fluid levels on left lateral decubitus view question small bowel obstruction. Scattered stool present throughout the colon to the rectum. No definite free intraperitoneal air or definite urinary tract calcification.  IMPRESSION: Dilated air-filled loops of small bowel with scattered air-fluid levels on left lateral decubitus view question small bowel obstruction.  However, stool is present throughout colon to rectum. Question COPD with old calcified granuloma left upper lobe.   Original Report Authenticated By: Ulyses Southward, M.D.    Dg Foot Complete Right  05/01/2012  *RADIOLOGY REPORT*  Clinical Data: Pain, bruising and swelling at dorsal midfoot post fall  RIGHT FOOT COMPLETE - 3+ VIEW  Comparison: None  Findings: Osseous demineralization. Minimal hallux valgus. Degenerative changes first MTP joint. Remaining joint spaces preserved. Small plantar calcaneal spur. Dorsal soft tissue swelling at mid foot. No definite acute fracture, dislocation, or bone destruction. Scattered atherosclerotic calcifications.  IMPRESSION: Osseous demineralization. Hallux valgus with mild degenerative changes first MTP joint. Dorsal soft tissue swelling midfoot without definite acute bony abnormalities.   Original Report Authenticated By: Ulyses Southward, M.D.     Review of Systems  Unable to perform ROS: mental status change   Blood pressure 122/95, pulse 105, temperature 99.7 F (37.6 C), temperature source Oral, resp. rate 26, height 5\' 4"  (1.626 m), weight 145 lb (65.772 kg), SpO2 90.00%. Physical Exam  Constitutional: She appears well-developed. She appears distressed.  HENT:  Head: Normocephalic and atraumatic.  Right Ear: External ear normal.  Left Ear: External ear normal.  Eyes: Conjunctivae normal are normal. Pupils are equal, round, and reactive to light. No scleral icterus.  Neck: Neck supple. No tracheal deviation present. No thyromegaly present.  Cardiovascular: Normal rate, regular rhythm and intact distal pulses.   No murmur heard. Respiratory: Effort normal and breath sounds normal. No respiratory distress. She has no wheezes. She exhibits no tenderness.  GI: Soft. She exhibits distension. She exhibits no mass. There is no tenderness. There is no rebound and no guarding.       Hypoactive bowel sounds  Neurological: She is alert.  Perseverates, does not  answer direct questions   Skin: Skin is warm and dry. No rash noted. She is not diaphoretic. No erythema. There is pallor.       Eyes sunken  Psychiatric:       Disoriented, confused, but alert    Assessment/Plan: Hyponatremia - correct slowly per TRH SBO - unclear if mass present.  Certainly there is concern given lack of prior surgical history  Pt does not want surgery.  Will treat with bowel rest and enemas, NGT (18 g preferred).  Will see if resolves. Repeat films in AM.    [name] 05/01/2012, 7:11 AM

## 2012-05-01 NOTE — H&P (Signed)
PCP:  [facility]: Donnie Aho  Chief Complaint:  Abdominal pain  HPI: [name] is a 79 y.o. female   has a past medical history of Hypertension and Irregular heart beat.   Presented with  Yesterday afternoon she started to have abdominal pain and nausea with vomiting. Her last BM was this AM. Currently her abdominal pain improved. She has hx of chronic constipations.  No fever or chills.  No diarrhea. She have had some URI symptoms all last week. She had not have a colonoscopy done in her life.  Surgery  has been consulted and will see her  in am. He tube has been ordered.  Review of Systems:     Pertinent positives include: abdominal pain, nausea, vomiting,   Constitutional:  No weight loss, night sweats, Fevers, chills, fatigue, weight loss  HEENT:  No headaches, Difficulty swallowing,Tooth/dental problems,Sore throat,  No sneezing, itching, ear ache, nasal congestion, post nasal drip,  Cardio-vascular:  No chest pain, Orthopnea, PND, anasarca, dizziness, palpitations.no Bilateral lower extremity swelling  GI:  No heartburn, indigestion, diarrhea, change in bowel habits, loss of appetite, melena, blood in stool, hematemesis Resp:  no shortness of breath at rest. No dyspnea on exertion, No excess mucus, no productive cough, No non-productive cough, No coughing up of blood.No change in color of mucus.No wheezing. Skin:  no rash or lesions. No jaundice GU:  no dysuria, change in color of urine, no urgency or frequency. No straining to urinate.  No flank pain.  Musculoskeletal:  No joint pain or no joint swelling. No decreased range of motion. No back pain.  Psych:  No change in mood or affect. No depression or anxiety. No memory loss.  Neuro: no localizing neurological complaints, no tingling, no weakness, no double vision, no gait abnormality, no slurred speech, no confusion  Otherwise ROS are negative except for above, 10 systems were reviewed  Past Medical  History: Past Medical History  Diagnosis Date  . Hypertension   . Irregular heart beat    Past Surgical History  Procedure Date  . Cataract extraction      Medications: Prior to Admission medications   Medication Sig Start Date End Date Taking? Authorizing Provider  ALPRAZolam Prudy Feeler) 0.5 MG tablet Take 0.25 mg by mouth 2 (two) times daily as needed. anxiety   Yes Historical Provider, MD  AMLODIPINE BESYLATE PO Take 5 mg by mouth daily.    Yes Historical Provider, MD  aspirin 81 MG tablet Take 81 mg by mouth daily.   Yes Historical Provider, MD  ATENOLOL PO Take 50 mg by mouth 2 (two) times daily.    Yes Historical Provider, MD  Boswellia-Glucosamine-Vit D (GLUCOSAMINE COMPLEX PO) Take 1 tablet by mouth daily.    Yes Historical Provider, MD  cetirizine (ZYRTEC) 10 MG tablet Take 10 mg by mouth daily.   Yes Historical Provider, MD  Cyanocobalamin (VITAMIN B 12 PO) Take by mouth.   Yes Historical Provider, MD  HYDROcodone-acetaminophen (NORCO/VICODIN) 5-325 MG per tablet Take 1 tablet by mouth every 6 (six) hours as needed. pain   Yes Historical Provider, MD  spironolactone-hydrochlorothiazide (ALDACTAZIDE) 25-25 MG per tablet Take 1 tablet by mouth daily.   Yes Historical Provider, MD  Valsartan (DIOVAN PO) Take 160 mg by mouth daily.    Yes Historical Provider, MD    Allergies:   Allergies  Allergen Reactions  . Neosporin (Neomycin-Bacitracin Zn-Polymyx)   . Sulfa Antibiotics     Social History:  Ambulatory   independently  Or walker with cane Lives at   Home  alone   reports that she has never smoked. She does not have any smokeless tobacco history on file. She reports that she does not drink alcohol or use illicit drugs.   Family History: family history includes Heart disease in her mother and Stroke in her father.    Physical Exam: Patient Vitals for the past 24 hrs:  BP Temp Temp src Pulse Resp SpO2 Height Weight  05/01/12 0346 149/61 mmHg 98.1 F (36.7 C) Oral  79  20  96 % - -  04/30/12 2232 123/61 mmHg 97.9 F (36.6 C) Oral 58  16  98 % 5\' 4"  (1.626 m) 65.772 kg (145 lb)    1. General:  in No Acute distress 2. Psychological: Alert and   Oriented 3. Head/ENT:    Dry Mucous Membranes                          Head Non traumatic, neck supple                          Normal   Dentition 4. SKIN: decreased Skin turgor,  Skin clean Dry and intact no rash 5. Heart: Regular rate and rhythm no Murmur, Rub or gallop 6. Lungs:  no wheezes mild occasional crackles   7. Abdomen: Slightly diffusely-tender, distended absent bowel sounds 8. Lower extremities: no clubbing, cyanosis, or edema 9. Neurologically Grossly intact, moving all 4 extremities equally 10. MSK: Normal range of motion  body mass index is 24.89 kg/(m^2).   Labs on Admission:   [facility] 05/01/12 0015  NA 124*  K 4.3  CL 86*  CO2 25  GLUCOSE 187*  BUN 18  CREATININE 0.74  CALCIUM 9.9  MG --  PHOS --    Basename 05/01/12 0015  AST 28  ALT 14  ALKPHOS 86  BILITOT 0.7  PROT 8.4*  ALBUMIN 4.3    Basename 05/01/12 0015  LIPASE 32  AMYLASE --    Basename 05/01/12 0015  WBC 15.1*  NEUTROABS 11.8*  HGB 12.0  HCT 35.1*  MCV 82.8  PLT 190    Basename 05/01/12 0015  CKTOTAL --  CKMB --  CKMBINDEX --  TROPONINI <0.30   No results found for this basename: TSH,T4TOTAL,FREET3,T3FREE,THYROIDAB in the last 72 hours No results found for this basename: VITAMINB12:2,FOLATE:2,FERRITIN:2,TIBC:2,IRON:2,RETICCTPCT:2 in the last 72 hours No results found for this basename: HGBA1C    Estimated Creatinine Clearance: 44.5 ml/min (by C-G formula based on Cr of 0.74). ABG No results found for this basename: phart, pco2, po2, hco3, tco2, acidbasedef, o2sat     No results found for this basename: DDIMER     Other results:  I have pearsonaly reviewed this: ECG REPORT  Rate: 62  Rhythm: Normal sinus rhythm and occasional PVCs ST&T Change: No ischemic changes  UA no  evidence of infection  Cultures: No results found for this basename: sdes, specrequest, cult, reptstatus       Radiological Exams on Admission: Ct Abdomen Pelvis W Contrast  05/01/2012  *RADIOLOGY REPORT*  Clinical Data: Nausea, vomiting, question small bowel obstruction, abnormal radiographs  CT ABDOMEN AND PELVIS WITH CONTRAST  Technique:  Multidetector CT imaging of the abdomen and pelvis was performed following the standard protocol during bolus administration of intravenous contrast. Sagittal and coronal MPR images reconstructed from axial data set.  Contrast: OMNIPAQUE IOHEXOL 300 MG/ML  SOLN  Dilute oral contrast.  Comparison: Abdominal radiographs 05/01/2012  Findings: Minimal atelectasis right lung base. Moderate sized hiatal hernia. Small cyst upper pole right kidney. Liver, spleen, pancreas, kidneys, and adrenal glands otherwise unremarkable. Dilatation of stomach and proximal small bowel loops with decompressed distal small bowel loops compatible with small bowel obstruction. Dilatation of small bowel loops terminates in the upper mid pelvis anteriorly image 62. Small bowel stool sign is identified within the dilated distal small bowel loops. Question minimal bowel wall thickening at the point of obstruction. Stool is present throughout the colon and rectum.  No additional bowel wall thickening, free fluid, or free air. Unremarkable bladder, ureters, and adnexae. Small fat containing lipoleiomyoma within uterus. Normal appendix. No mass, adenopathy or hernia. Bones appear demineralized. Scattered atherosclerotic calcifications. Degenerative disc disease changes lumbar spine with superior endplate compression fracture of T11, age indeterminate.  IMPRESSION: Small bowel obstruction with transition zone in the anterior central pelvis, associated with bowel wall thickening at the point of obstruction. This could represent an adhesion though subtle mass lesion is not excluded. Increased stool  throughout colon. Moderate sized hiatal hernia.   Original Report Authenticated By: Ulyses Southward, M.D.    Dg Abd Acute W/chest  05/01/2012  *RADIOLOGY REPORT*  Clinical Data: Nausea, vomiting, abdominal pain  ACUTE ABDOMEN SERIES (ABDOMEN 2 VIEW & CHEST 1 VIEW)  Comparison: Chest radiograph 05/28/2006 Correlation:  CT chest 11/10/2005  Findings: Mild enlargement of cardiac silhouette. Atherosclerotic ossification aorta. Mediastinal contours and pulmonary vascularity otherwise normal. Emphysematous and bronchitic changes consistent with COPD. Partially calcified anterior left upper lobe nodule, little changed. No acute infiltrate, pleural effusion, or pneumothorax.  Bones appear demineralized. Dilated air-filled small bowel loops with scattered air-fluid levels on left lateral decubitus view question small bowel obstruction. Scattered stool present throughout the colon to the rectum. No definite free intraperitoneal air or definite urinary tract calcification.  IMPRESSION: Dilated air-filled loops of small bowel with scattered air-fluid levels on left lateral decubitus view question small bowel obstruction. However, stool is present throughout colon to rectum. Question COPD with old calcified granuloma left upper lobe.   Original Report Authenticated By: Ulyses Southward, M.D.    Dg Foot Complete Right  05/01/2012  *RADIOLOGY REPORT*  Clinical Data: Pain, bruising and swelling at dorsal midfoot post fall  RIGHT FOOT COMPLETE - 3+ VIEW  Comparison: None  Findings: Osseous demineralization. Minimal hallux valgus. Degenerative changes first MTP joint. Remaining joint spaces preserved. Small plantar calcaneal spur. Dorsal soft tissue swelling at mid foot. No definite acute fracture, dislocation, or bone destruction. Scattered atherosclerotic calcifications.  IMPRESSION: Osseous demineralization. Hallux valgus with mild degenerative changes first MTP joint. Dorsal soft tissue swelling midfoot without definite acute bony  abnormalities.   Original Report Authenticated By: Ulyses Southward, M.D.     Chart has been reviewed  Assessment/Plan  80 year old female small bowel obstruction etiology not completely clear small bowel mass could not be ruled out.   Present on Admission:  . SBO (small bowel obstruction) - conservative management right now with NG tube placement and bowel rest. Surgery will reevaluate in the morning. Patient has no abdominal surgical history.  . Hyponatremia - likely secondary to dehydration and pain.  we'll obtain urine electrolytes and give IV fluids  . HTN (hypertension) - patient is n.p.o. will replace her  beta blocker with IV Lopressor with holding parameters and give when necessary hydralazine while n.p.o. Painful right foot hematoma  - plain films did not show any evidence of an old fracture,  will need to watch for any evidence of infection.  Prophylaxis: SCD Protonix  CODE STATUS: DNR/DNI as per patient's and family wishes  Other plan as per orders.  I have spent a total of 55 min on this admission  [name] 05/01/2012, 4:17 AM

## 2012-05-01 NOTE — Progress Notes (Signed)
[facility] PROGRESS NOTE  [name] ZOX:[id number] DOB: [birth year] DOA: 04/30/2012 PCP: No primary provider on file.  Assessment/Plan: Principal Problem:  *SBO (small bowel obstruction) Active Problems:  Hyponatremia  HTN (hypertension)    1. SBO (small bowel obstruction): Patient presented with about 24 hours of abdominal pain, and vomiting. Imaging studies demonstrated small bowel obstruction with transition zone in the anterior central pelvis, associated with bowel wall thickening at the point of obstruction. This could represent an adhesion though subtle mass lesion is not excluded. There was increased stool throughout colon, and a moderate sized hiatal hernia. Dr Girtha Hake provided surgical consultation and has recommended bowel rest and enemas, NG-T and follow up abdominal films.  2. Hyponatremia: Sodium was 121 at presentation. This is likely secondary to dehydration, as well as pre-admission diuretic therapy. Diuretics are on hold. managing with iv NS. Following lytes.  3. HTN (hypertension): Controlled on iv Lopressor with holding parameters and prn Hydralazine while n.p.o.  4. Painful right foot hematoma: Patient has a hematoma on the dorsum of her right foot about 4 cm in diameter, following a recent fall. Fortunately, no bony injuries on plain films. Clinically, there is no evidence of infection at this time.   Code Status: DNR/DNI Family Communication:  Disposition Plan: To be determined.    Brief narrative: 79 y.o. Female with history of Hypertension, arrhythmia, previous cataract extraction, IBS and chronic constipation, presenting with abdominal pain, nausea and vomiting since 04/30/12, without o diarrhea. She had some URI symptoms all last week. She has never had a colonoscopy done in her life. Admitted for further evaluation and management.     Consultants:  Almond Lint, surgeon.   Procedures:  CXR.  Abdominal X-Ray.  X-Ray right foot.  CT  Abdomen/pelvis.   Antibiotics:  N/A.   HPI/Subjective: No new issues.   Objective: Vital signs in last 24 hours: Temp:  [97.9 F (36.6 C)-99.7 F (37.6 C)] 99.3 F (37.4 C) (01/04 0816) Pulse Rate:  [58-105] 58  (01/04 0816) Resp:  [16-27] 22  (01/04 0816) BP: (110-149)/(41-95) 110/41 mmHg (01/04 0816) SpO2:  [90 %-100 %] 100 % (01/04 0816) Weight:  [65.772 kg (145 lb)-67.7 kg (149 lb 4 oz)] 67.7 kg (149 lb 4 oz) (01/04 0816) Weight change:     Intake/Output from previous day: 01/03 0701 - 01/04 0700 In: -  Out: 650 [Emesis/NG output:650]     Physical Exam: General: Comfortable, alert, communicative, fully oriented, not short of breath at rest. NG-T in situ, with only small drainage.  HEENT:  No clinical pallor, no jaundice, no conjunctival injection or discharge. Hydration is fair.  NECK:  Supple, JVP not seen, no carotid bruits, no palpable lymphadenopathy, no palpable goiter. CHEST:  Clinically clear to auscultation, no wheezes, no crackles. HEART:  Sounds 1 and 2 heard, normal, regular, no murmurs. ABDOMEN:  Full, soft, non-tender, no palpable organomegaly, no palpable masses, normal bowel sounds. GENITALIA:  Not examined. LOWER EXTREMITIES:  No pitting edema, palpable peripheral pulses. Patient has a 4 cm diameter cystic , mildly tender swelling with overlying bruising, on the dorsum of her right foot, consistent with hematoma. Not clinically infected.  MUSCULOSKELETAL SYSTEM:  Generalized osteoarthritic changes, otherwise, normal. CENTRAL NERVOUS SYSTEM:  No focal neurologic deficit on gross examination.  Lab Results:  [facility] 05/01/12 0015  WBC 15.1*  HGB 12.0  HCT 35.1*  PLT 190    Basename 05/01/12 0015  NA 124*  K 4.3  CL 86*  CO2 25  GLUCOSE 187*  BUN 18  CREATININE 0.74  CALCIUM 9.9   No results found for this or any previous visit (from the past 240 hour(s)).   Studies/Results: Dg Chest 2 View  05/01/2012  *RADIOLOGY REPORT*  Clinical  Data: Emesis, question aspiration  CHEST - 2 VIEW  Comparison: Prior radiograph of 05/01/2012  Findings: Nasogastric tube extends into stomach. Enlargement of cardiac silhouette. Calcified left upper lobe nodular density again seen. No new infiltrate or pleural effusion. Atherosclerotic calcification of a mildly tortuous thoracic aorta. Bones demineralized.  IMPRESSION: No new infiltrates identified. Enlargement of cardiac silhouette. Old granulomatous disease.   Original Report Authenticated By: Ulyses Southward, M.D.    Ct Abdomen Pelvis W Contrast  05/01/2012  *RADIOLOGY REPORT*  Clinical Data: Nausea, vomiting, question small bowel obstruction, abnormal radiographs  CT ABDOMEN AND PELVIS WITH CONTRAST  Technique:  Multidetector CT imaging of the abdomen and pelvis was performed following the standard protocol during bolus administration of intravenous contrast. Sagittal and coronal MPR images reconstructed from axial data set.  Contrast: OMNIPAQUE IOHEXOL 300 MG/ML  SOLN Dilute oral contrast.  Comparison: Abdominal radiographs 05/01/2012  Findings: Minimal atelectasis right lung base. Moderate sized hiatal hernia. Small cyst upper pole right kidney. Liver, spleen, pancreas, kidneys, and adrenal glands otherwise unremarkable. Dilatation of stomach and proximal small bowel loops with decompressed distal small bowel loops compatible with small bowel obstruction. Dilatation of small bowel loops terminates in the upper mid pelvis anteriorly image 62. Small bowel stool sign is identified within the dilated distal small bowel loops. Question minimal bowel wall thickening at the point of obstruction. Stool is present throughout the colon and rectum.  No additional bowel wall thickening, free fluid, or free air. Unremarkable bladder, ureters, and adnexae. Small fat containing lipoleiomyoma within uterus. Normal appendix. No mass, adenopathy or hernia. Bones appear demineralized. Scattered atherosclerotic  calcifications. Degenerative disc disease changes lumbar spine with superior endplate compression fracture of T11, age indeterminate.  IMPRESSION: Small bowel obstruction with transition zone in the anterior central pelvis, associated with bowel wall thickening at the point of obstruction. This could represent an adhesion though subtle mass lesion is not excluded. Increased stool throughout colon. Moderate sized hiatal hernia.   Original Report Authenticated By: Ulyses Southward, M.D.    Dg Abd Acute W/chest  05/01/2012  *RADIOLOGY REPORT*  Clinical Data: Nausea, vomiting, abdominal pain  ACUTE ABDOMEN SERIES (ABDOMEN 2 VIEW & CHEST 1 VIEW)  Comparison: Chest radiograph 05/28/2006 Correlation:  CT chest 11/10/2005  Findings: Mild enlargement of cardiac silhouette. Atherosclerotic ossification aorta. Mediastinal contours and pulmonary vascularity otherwise normal. Emphysematous and bronchitic changes consistent with COPD. Partially calcified anterior left upper lobe nodule, little changed. No acute infiltrate, pleural effusion, or pneumothorax.  Bones appear demineralized. Dilated air-filled small bowel loops with scattered air-fluid levels on left lateral decubitus view question small bowel obstruction. Scattered stool present throughout the colon to the rectum. No definite free intraperitoneal air or definite urinary tract calcification.  IMPRESSION: Dilated air-filled loops of small bowel with scattered air-fluid levels on left lateral decubitus view question small bowel obstruction. However, stool is present throughout colon to rectum. Question COPD with old calcified granuloma left upper lobe.   Original Report Authenticated By: Ulyses Southward, M.D.    Dg Foot Complete Right  05/01/2012  *RADIOLOGY REPORT*  Clinical Data: Pain, bruising and swelling at dorsal midfoot post fall  RIGHT FOOT COMPLETE - 3+ VIEW  Comparison: None  Findings: Osseous demineralization. Minimal hallux valgus. Degenerative changes first  MTP  joint. Remaining joint spaces preserved. Small plantar calcaneal spur. Dorsal soft tissue swelling at mid foot. No definite acute fracture, dislocation, or bone destruction. Scattered atherosclerotic calcifications.  IMPRESSION: Osseous demineralization. Hallux valgus with mild degenerative changes first MTP joint. Dorsal soft tissue swelling midfoot without definite acute bony abnormalities.   Original Report Authenticated By: Ulyses Southward, M.D.     Medications: Scheduled Meds:   . sorbitol, milk of mag, mineral oil, glycerin (SMOG) enema  960 mL Rectal Once   Continuous Infusions:  PRN Meds:.    LOS: 1 day   [name],CHRISTOPHER  [facility] Pager [phone]. If 8PM-8AM, please contact night-coverage at [email or web address], password [facility] 05/01/2012, 8:23 AM  LOS: 1 day

## 2012-05-01 NOTE — ED Notes (Signed)
Pt unable to void at this time will notify nurse

## 2012-05-02 ENCOUNTER — Inpatient Hospital Stay (HOSPITAL_COMMUNITY): Payer: Medicare Other

## 2012-05-02 DIAGNOSIS — R339 Retention of urine, unspecified: Secondary | ICD-10-CM

## 2012-05-02 DIAGNOSIS — D72829 Elevated white blood cell count, unspecified: Secondary | ICD-10-CM

## 2012-05-02 LAB — BASIC METABOLIC PANEL
BUN: 33 mg/dL — ABNORMAL HIGH (ref 6–23)
CO2: 22 mEq/L (ref 19–32)
Chloride: 92 mEq/L — ABNORMAL LOW (ref 96–112)
Creatinine, Ser: 1.72 mg/dL — ABNORMAL HIGH (ref 0.50–1.10)
Potassium: 4.5 mEq/L (ref 3.5–5.1)

## 2012-05-02 LAB — OSMOLALITY, URINE: Osmolality, Ur: 405 mOsm/kg (ref 390–1090)

## 2012-05-02 LAB — GLUCOSE, CAPILLARY: Glucose-Capillary: 112 mg/dL — ABNORMAL HIGH (ref 70–99)

## 2012-05-02 LAB — CREATININE, URINE, RANDOM: Creatinine, Urine: 93.7 mg/dL

## 2012-05-02 LAB — SODIUM, URINE, RANDOM: Sodium, Ur: 48 mEq/L

## 2012-05-02 LAB — CBC
HCT: 29.1 % — ABNORMAL LOW (ref 36.0–46.0)
MCV: 81.5 fL (ref 78.0–100.0)
Platelets: 154 10*3/uL (ref 150–400)
RBC: 3.57 MIL/uL — ABNORMAL LOW (ref 3.87–5.11)
WBC: 39 10*3/uL — ABNORMAL HIGH (ref 4.0–10.5)

## 2012-05-02 MED ORDER — SODIUM CHLORIDE 0.9 % IV SOLN
INTRAVENOUS | Status: DC
  Administered 2012-05-02: 12:00:00 via INTRAVENOUS
  Administered 2012-05-04: 75 mL/h via INTRAVENOUS
  Administered 2012-05-04: 1000 mL via INTRAVENOUS
  Administered 2012-05-06 – 2012-05-07 (×2): via INTRAVENOUS
  Administered 2012-05-07: 75 mL/h via INTRAVENOUS
  Administered 2012-05-08: 22:00:00 via INTRAVENOUS

## 2012-05-02 MED ORDER — SODIUM CHLORIDE 0.9 % IV BOLUS (SEPSIS)
1000.0000 mL | Freq: Once | INTRAVENOUS | Status: AC
  Administered 2012-05-02: 1000 mL via INTRAVENOUS

## 2012-05-02 MED ORDER — MENTHOL 3 MG MT LOZG
1.0000 | LOZENGE | OROMUCOSAL | Status: DC | PRN
  Filled 2012-05-02: qty 9

## 2012-05-02 MED ORDER — PHENOL 1.4 % MT LIQD
1.0000 | OROMUCOSAL | Status: DC | PRN
Start: 2012-05-02 — End: 2012-05-17
  Filled 2012-05-02: qty 177

## 2012-05-02 MED ORDER — SORBITOL 70 % SOLN
960.0000 mL | TOPICAL_OIL | Freq: Once | ORAL | Status: AC
  Administered 2012-05-02: 960 mL via RECTAL
  Filled 2012-05-02: qty 240

## 2012-05-02 MED ORDER — MAGIC MOUTHWASH
15.0000 mL | Freq: Four times a day (QID) | ORAL | Status: DC | PRN
  Filled 2012-05-02: qty 15

## 2012-05-02 MED ORDER — PIPERACILLIN-TAZOBACTAM IN DEX 2-0.25 GM/50ML IV SOLN
2.2500 g | Freq: Four times a day (QID) | INTRAVENOUS | Status: DC
  Administered 2012-05-02 – 2012-05-03 (×4): 2.25 g via INTRAVENOUS
  Filled 2012-05-02 (×5): qty 50

## 2012-05-02 MED ORDER — BISACODYL 10 MG RE SUPP: 10.0000 mg | Freq: Two times a day (BID) | RECTAL | Status: DC | PRN

## 2012-05-02 MED ORDER — ALUM & MAG HYDROXIDE-SIMETH 200-200-20 MG/5ML PO SUSP
30.0000 mL | Freq: Four times a day (QID) | ORAL | Status: DC | PRN
  Administered 2012-05-09 – 2012-05-11 (×3): 30 mL via ORAL
  Filled 2012-05-02 (×3): qty 30

## 2012-05-02 MED ORDER — PHENOL 1.4 % MT LIQD: 2.0000 | OROMUCOSAL | Status: DC | PRN

## 2012-05-02 MED ORDER — LIP MEDEX EX OINT
1.0000 "application " | TOPICAL_OINTMENT | Freq: Two times a day (BID) | CUTANEOUS | Status: DC
  Administered 2012-05-02 – 2012-05-17 (×29): 1 via TOPICAL
  Filled 2012-05-02: qty 7

## 2012-05-02 NOTE — Progress Notes (Signed)
Subjective: Pt had urinary retention last night.  Is incontinent of urine at home.  Had BM with enema.  Xray with decreased bowel caliber today.  Gas in colon on film.  No complaints of abdominal pain except when bladder ultrasound done.    Objective: Vital signs in last 24 hours: Temp:  [97.8 F (36.6 C)-99.2 F (37.3 C)] 99.2 F (37.3 C) (01/05 0621) Pulse Rate:  [74-80] 79  (01/05 0621) Resp:  [18-20] 18  (01/05 0621) BP: (87-110)/(31-52) 106/35 mmHg (01/05 0621) SpO2:  [93 %-100 %] 100 % (01/05 0621) Last BM Date: 05/01/12  Intake/Output from previous day: 01/04 0701 - 01/05 0700 In: 1203.7 [I.V.:1201.7; IV Piggyback:2] Out: 611 [Urine:611] Intake/Output this shift:    General appearance: alert, appears stated age and no distress GI: soft, much less distended than yesterday, non tender.   Extremities: extremities normal, atraumatic, no cyanosis or edema Neurologic: Mental status:  Much more alert today.  Approp answers today.   Sensory: decreased hearing.  Lab Results:   Basename 05/02/12 0455 05/01/12 0905  WBC 39.0* 33.3*  HGB 10.0* 11.7*  HCT 29.1* 33.4*  PLT 154 172   BMET  Basename 05/02/12 0455 05/01/12 0905  NA 128* 121*  K 4.5 3.7  CL 92* 86*  CO2 22 20  GLUCOSE 116* 128*  BUN 33* 19  CREATININE 1.72* 0.93  CALCIUM 8.7 9.0   PT/INR No results found for this basename: LABPROT:2,INR:2 in the last 72 hours ABG No results found for this basename: PHART:2,PCO2:2,PO2:2,HCO3:2 in the last 72 hours  Studies/Results: Dg Chest 2 View  05/01/2012  *RADIOLOGY REPORT*  Clinical Data: Emesis, question aspiration  CHEST - 2 VIEW  Comparison: Prior radiograph of 05/01/2012  Findings: Nasogastric tube extends into stomach. Enlargement of cardiac silhouette. Calcified left upper lobe nodular density again seen. No new infiltrate or pleural effusion. Atherosclerotic calcification of a mildly tortuous thoracic aorta. Bones demineralized.  IMPRESSION: No new  infiltrates identified. Enlargement of cardiac silhouette. Old granulomatous disease.   Original Report Authenticated By: Ulyses Southward, M.D.    Ct Abdomen Pelvis W Contrast  05/01/2012  *RADIOLOGY REPORT*  Clinical Data: Nausea, vomiting, question small bowel obstruction, abnormal radiographs  CT ABDOMEN AND PELVIS WITH CONTRAST  Technique:  Multidetector CT imaging of the abdomen and pelvis was performed following the standard protocol during bolus administration of intravenous contrast. Sagittal and coronal MPR images reconstructed from axial data set.  Contrast: OMNIPAQUE IOHEXOL 300 MG/ML  SOLN Dilute oral contrast.  Comparison: Abdominal radiographs 05/01/2012  Findings: Minimal atelectasis right lung base. Moderate sized hiatal hernia. Small cyst upper pole right kidney. Liver, spleen, pancreas, kidneys, and adrenal glands otherwise unremarkable. Dilatation of stomach and proximal small bowel loops with decompressed distal small bowel loops compatible with small bowel obstruction. Dilatation of small bowel loops terminates in the upper mid pelvis anteriorly image 62. Small bowel stool sign is identified within the dilated distal small bowel loops. Question minimal bowel wall thickening at the point of obstruction. Stool is present throughout the colon and rectum.  No additional bowel wall thickening, free fluid, or free air. Unremarkable bladder, ureters, and adnexae. Small fat containing lipoleiomyoma within uterus. Normal appendix. No mass, adenopathy or hernia. Bones appear demineralized. Scattered atherosclerotic calcifications. Degenerative disc disease changes lumbar spine with superior endplate compression fracture of T11, age indeterminate.  IMPRESSION: Small bowel obstruction with transition zone in the anterior central pelvis, associated with bowel wall thickening at the point of obstruction. This could represent an  adhesion though subtle mass lesion is not excluded. Increased stool throughout  colon. Moderate sized hiatal hernia.   Original Report Authenticated By: Ulyses Southward, M.D.    Dg Abd 2 Views  05/02/2012  *RADIOLOGY REPORT*  Clinical Data: Abdominal pain, weakness  ABDOMEN - 2 VIEW  Comparison: CT 05/01/2012  Findings: Nasogastric tube is appropriately positioned.  No free air on decubitus imaging.  Slight interval decrease in caliber of multiple dilated small bowel loops over the mid abdomen, maximal diameter now 4.1 cm.  Mild-moderate stool burden throughout nondilated colon.  Leftward curvature of the lumbar spine noted with atheromatous calcification of the aorta.  Retained contrast within the bladder.  IMPRESSION: Interval decrease in caliber of dilated small bowel loops, suggesting improvement in small bowel obstruction.   Original Report Authenticated By: Christiana Pellant, M.D.    Dg Abd Acute W/chest  05/01/2012  *RADIOLOGY REPORT*  Clinical Data: Nausea, vomiting, abdominal pain  ACUTE ABDOMEN SERIES (ABDOMEN 2 VIEW & CHEST 1 VIEW)  Comparison: Chest radiograph 05/28/2006 Correlation:  CT chest 11/10/2005  Findings: Mild enlargement of cardiac silhouette. Atherosclerotic ossification aorta. Mediastinal contours and pulmonary vascularity otherwise normal. Emphysematous and bronchitic changes consistent with COPD. Partially calcified anterior left upper lobe nodule, little changed. No acute infiltrate, pleural effusion, or pneumothorax.  Bones appear demineralized. Dilated air-filled small bowel loops with scattered air-fluid levels on left lateral decubitus view question small bowel obstruction. Scattered stool present throughout the colon to the rectum. No definite free intraperitoneal air or definite urinary tract calcification.  IMPRESSION: Dilated air-filled loops of small bowel with scattered air-fluid levels on left lateral decubitus view question small bowel obstruction. However, stool is present throughout colon to rectum. Question COPD with old calcified granuloma left upper  lobe.   Original Report Authenticated By: Ulyses Southward, M.D.    Dg Foot Complete Right  05/01/2012  *RADIOLOGY REPORT*  Clinical Data: Pain, bruising and swelling at dorsal midfoot post fall  RIGHT FOOT COMPLETE - 3+ VIEW  Comparison: None  Findings: Osseous demineralization. Minimal hallux valgus. Degenerative changes first MTP joint. Remaining joint spaces preserved. Small plantar calcaneal spur. Dorsal soft tissue swelling at mid foot. No definite acute fracture, dislocation, or bone destruction. Scattered atherosclerotic calcifications.  IMPRESSION: Osseous demineralization. Hallux valgus with mild degenerative changes first MTP joint. Dorsal soft tissue swelling midfoot without definite acute bony abnormalities.   Original Report Authenticated By: Ulyses Southward, M.D.     Anti-infectives: Anti-infectives    None      Assessment/Plan: s/p * No surgery found * Place foley for urinary output monitoring and retention. NGT/bowel rest for p SBO.  Give another enema today. Despite significant leukocytosis, pt's abdominal exam improved today and no UTI on U/A. Unclear source.   Dehydration and hyponatremia - correcting.  Continue NS.  LOS: 2 days    [name] 05/02/2012

## 2012-05-02 NOTE — Progress Notes (Signed)
[facility] PROGRESS NOTE  [name] ZOX:[id number] DOB: 12-31-1920 DOA: 04/30/2012 PCP: No primary provider on file.  Assessment/Plan: Principal Problem:  *SBO (small bowel obstruction) Active Problems:  Hyponatremia  HTN (hypertension)  Traumatic hematoma of foot    1. SBO (small bowel obstruction): Patient presented with about 24 hours of abdominal pain, and vomiting. Imaging studies demonstrated small bowel obstruction with transition zone in the anterior central pelvis, associated with bowel wall thickening at the point of obstruction. This could represent an adhesion, though subtle mass lesion is not excluded. There was increased stool throughout colon, and a moderate sized hiatal hernia. Dr Girtha Hake provided surgical consultation and has recommended bowel rest and enemas, NG-T and follow up abdominal films. Clinically, patient appears to be making progress, with decreased NG-T output, and decreased bowel caliber on X-Ray. Continue management per surgeon.  2. Hyponatremia: Sodium was 121 at presentation. This is likely secondary to dehydration, as well as pre-admission diuretic therapy. Diuretics are on hold. managing with iv NS, with improvement. Following lytes.  3. HTN (hypertension): Controlled on iv Lopressor with holding parameters and prn Hydralazine while n.p.o.  4. Painful right foot hematoma: Patient has a hematoma on the dorsum of her right foot about 4 cm in diameter, following a recent fall. Fortunately, no bony injuries on plain films. Clinically, there is no evidence of infection at this time. 5. Acute urinary retention: This occurred overnight, and was addressed with Foley catheter.  6. Leukocytosis: Patient has had a high, persistent leukocytosis, although she is afebrile, CXR was devoid of infiltrates and urinalysis was negative. Will cover with empiric Zosyn, in case of sureptitious aspiration or a UTI. Blood and cultures will be sent off.  7. Sore  throat/Dysphonia: Patient has a sore throat and hoarseness, after reinsertion of displaced NG-T on 05/02/11. Throat appears clear, without hyperemia or exudate. Will manage with Chloraseptic spray.  8. AKI: Had jump in creatinine from 0.93 to 1.72, overnight. Will increase iv fluids briefly, and follow renal indices.    Code Status: DNR/DNI Family Communication:  Disposition Plan: To be determined.    Brief narrative: 79 y.o. Female with history of Hypertension, arrhythmia, previous cataract extraction, IBS and chronic constipation, presenting with abdominal pain, nausea and vomiting since 04/30/12, without o diarrhea. She had some URI symptoms all last week. She has never had a colonoscopy done in her life. Admitted for further evaluation and management.     Consultants:  Almond Lint, surgeon.   Procedures:  CXR.  Abdominal X-Ray.  X-Ray right foot.  CT Abdomen/pelvis.   Antibiotics:  N/A.   HPI/Subjective: Had urinary retention last night. Throat is sore this AM.   Objective: Vital signs in last 24 hours: Temp:  [97.8 F (36.6 C)-99.2 F (37.3 C)] 99.2 F (37.3 C) (01/05 0621) Pulse Rate:  [74-80] 79  (01/05 0621) Resp:  [18-20] 18  (01/05 0621) BP: (87-110)/(31-52) 106/35 mmHg (01/05 0621) SpO2:  [93 %-100 %] 100 % (01/05 0621) Weight change: 1.928 kg (4 lb 4 oz) Last BM Date: 05/01/12  Intake/Output from previous day: 01/04 0701 - 01/05 0700 In: 1203.7 [I.V.:1201.7; IV Piggyback:2] Out: 611 [Urine:611]     Physical Exam: General: Comfortable, alert, communicative, fully oriented, not short of breath at rest. NG-T in situ, with only small drainage.  HEENT:  No clinical pallor, no jaundice, no conjunctival injection or discharge. Hydration is fair.  NECK:  Supple, JVP not seen, no carotid bruits, no palpable lymphadenopathy, no palpable goiter. CHEST:  Clinically clear to auscultation, no wheezes, no crackles. HEART:  Sounds 1 and 2 heard, normal, regular, no  murmurs. ABDOMEN:  Full, softy distended, non-tender, no palpable organomegaly, no palpable masses, normal bowel sounds. GENITALIA:  Not examined. LOWER EXTREMITIES:  No pitting edema, palpable peripheral pulses. Patient has a 4 cm diameter cystic , mildly tender swelling with overlying bruising, on the dorsum of her right foot, consistent with hematoma. Not clinically infected.  MUSCULOSKELETAL SYSTEM:  Generalized osteoarthritic changes, otherwise, normal. CENTRAL NERVOUS SYSTEM:  No focal neurologic deficit on gross examination.  Lab Results:  Basename 05/02/12 0455 05/01/12 0905  WBC 39.0* 33.3*  HGB 10.0* 11.7*  HCT 29.1* 33.4*  PLT 154 172    Basename 05/02/12 0455 05/01/12 0905  NA 128* 121*  K 4.5 3.7  CL 92* 86*  CO2 22 20  GLUCOSE 116* 128*  BUN 33* 19  CREATININE 1.72* 0.93  CALCIUM 8.7 9.0   No results found for this or any previous visit (from the past 240 hour(s)).   Studies/Results: Dg Chest 2 View  05/01/2012  *RADIOLOGY REPORT*  Clinical Data: Emesis, question aspiration  CHEST - 2 VIEW  Comparison: Prior radiograph of 05/01/2012  Findings: Nasogastric tube extends into stomach. Enlargement of cardiac silhouette. Calcified left upper lobe nodular density again seen. No new infiltrate or pleural effusion. Atherosclerotic calcification of a mildly tortuous thoracic aorta. Bones demineralized.  IMPRESSION: No new infiltrates identified. Enlargement of cardiac silhouette. Old granulomatous disease.   Original Report Authenticated By: Ulyses Southward, M.D.    Ct Abdomen Pelvis W Contrast  05/01/2012  *RADIOLOGY REPORT*  Clinical Data: Nausea, vomiting, question small bowel obstruction, abnormal radiographs  CT ABDOMEN AND PELVIS WITH CONTRAST  Technique:  Multidetector CT imaging of the abdomen and pelvis was performed following the standard protocol during bolus administration of intravenous contrast. Sagittal and coronal MPR images reconstructed from axial data set.  Contrast:  OMNIPAQUE IOHEXOL 300 MG/ML  SOLN Dilute oral contrast.  Comparison: Abdominal radiographs 05/01/2012  Findings: Minimal atelectasis right lung base. Moderate sized hiatal hernia. Small cyst upper pole right kidney. Liver, spleen, pancreas, kidneys, and adrenal glands otherwise unremarkable. Dilatation of stomach and proximal small bowel loops with decompressed distal small bowel loops compatible with small bowel obstruction. Dilatation of small bowel loops terminates in the upper mid pelvis anteriorly image 62. Small bowel stool sign is identified within the dilated distal small bowel loops. Question minimal bowel wall thickening at the point of obstruction. Stool is present throughout the colon and rectum.  No additional bowel wall thickening, free fluid, or free air. Unremarkable bladder, ureters, and adnexae. Small fat containing lipoleiomyoma within uterus. Normal appendix. No mass, adenopathy or hernia. Bones appear demineralized. Scattered atherosclerotic calcifications. Degenerative disc disease changes lumbar spine with superior endplate compression fracture of T11, age indeterminate.  IMPRESSION: Small bowel obstruction with transition zone in the anterior central pelvis, associated with bowel wall thickening at the point of obstruction. This could represent an adhesion though subtle mass lesion is not excluded. Increased stool throughout colon. Moderate sized hiatal hernia.   Original Report Authenticated By: Ulyses Southward, M.D.    Dg Abd 2 Views  05/02/2012  *RADIOLOGY REPORT*  Clinical Data: Abdominal pain, weakness  ABDOMEN - 2 VIEW  Comparison: CT 05/01/2012  Findings: Nasogastric tube is appropriately positioned.  No free air on decubitus imaging.  Slight interval decrease in caliber of multiple dilated small bowel loops over the mid abdomen, maximal diameter now 4.1 cm.  Mild-moderate  stool burden throughout nondilated colon.  Leftward curvature of the lumbar spine noted with atheromatous  calcification of the aorta.  Retained contrast within the bladder.  IMPRESSION: Interval decrease in caliber of dilated small bowel loops, suggesting improvement in small bowel obstruction.   Original Report Authenticated By: Christiana Pellant, M.D.    Dg Abd Acute W/chest  05/01/2012  *RADIOLOGY REPORT*  Clinical Data: Nausea, vomiting, abdominal pain  ACUTE ABDOMEN SERIES (ABDOMEN 2 VIEW & CHEST 1 VIEW)  Comparison: Chest radiograph 05/28/2006 Correlation:  CT chest 11/10/2005  Findings: Mild enlargement of cardiac silhouette. Atherosclerotic ossification aorta. Mediastinal contours and pulmonary vascularity otherwise normal. Emphysematous and bronchitic changes consistent with COPD. Partially calcified anterior left upper lobe nodule, little changed. No acute infiltrate, pleural effusion, or pneumothorax.  Bones appear demineralized. Dilated air-filled small bowel loops with scattered air-fluid levels on left lateral decubitus view question small bowel obstruction. Scattered stool present throughout the colon to the rectum. No definite free intraperitoneal air or definite urinary tract calcification.  IMPRESSION: Dilated air-filled loops of small bowel with scattered air-fluid levels on left lateral decubitus view question small bowel obstruction. However, stool is present throughout colon to rectum. Question COPD with old calcified granuloma left upper lobe.   Original Report Authenticated By: Ulyses Southward, M.D.    Dg Foot Complete Right  05/01/2012  *RADIOLOGY REPORT*  Clinical Data: Pain, bruising and swelling at dorsal midfoot post fall  RIGHT FOOT COMPLETE - 3+ VIEW  Comparison: None  Findings: Osseous demineralization. Minimal hallux valgus. Degenerative changes first MTP joint. Remaining joint spaces preserved. Small plantar calcaneal spur. Dorsal soft tissue swelling at mid foot. No definite acute fracture, dislocation, or bone destruction. Scattered atherosclerotic calcifications.  IMPRESSION: Osseous  demineralization. Hallux valgus with mild degenerative changes first MTP joint. Dorsal soft tissue swelling midfoot without definite acute bony abnormalities.   Original Report Authenticated By: Ulyses Southward, M.D.     Medications: Scheduled Meds:    . docusate sodium  100 mg Oral BID  . lip balm  1 application Topical BID  . metoprolol  2.5 mg Intravenous Q6H  . pantoprazole (PROTONIX) IV  40 mg Intravenous Q24H  . sodium chloride  3 mL Intravenous Q12H  . sorbitol, milk of mag, mineral oil, glycerin (SMOG) enema  960 mL Rectal Once   Continuous Infusions:  PRN Meds:.acetaminophen, acetaminophen, alum & mag hydroxide-simeth, bisacodyl, hydrALAZINE, magic mouthwash, menthol-cetylpyridinium, morphine injection, ondansetron (ZOFRAN) IV, ondansetron, phenol    LOS: 2 days   Mckenzi Buonomo,CHRISTOPHER  [facility] Pager [phone]. If 8PM-8AM, please contact night-coverage at [email or web address], password [facility] 05/02/2012, 11:42 AM  LOS: 2 days

## 2012-05-02 NOTE — Progress Notes (Signed)
Bladder scanned pt again in AM. Pt had in bladder. Will pass on to day nurse to continue to monitor.

## 2012-05-02 NOTE — Progress Notes (Signed)
ANTIBIOTIC CONSULT NOTE - INITIAL  Pharmacy Consult for zosyn Indication: unknown source of leukocytosis  Allergies  Allergen Reactions  . Neosporin (Neomycin-Bacitracin Zn-Polymyx)   . Sulfa Antibiotics     Patient Measurements: Height: 5\' 4"  (162.6 cm) Weight: 149 lb 4 oz (67.7 kg) IBW/kg (Calculated) : 54.7  Adjusted Body Weight:   Vital Signs: Temp: 99.2 F (37.3 C) (01/05 0621) Temp src: Oral (01/05 0621) BP: 106/35 mmHg (01/05 0621) Pulse Rate: 79  (01/05 0621) Intake/Output from previous day: 01/04 0701 - 01/05 0700 In: 1203.7 [I.V.:1201.7; IV Piggyback:2] Out: 611 [Urine:611] Intake/Output from this shift:    Labs:  Basename 05/02/12 0455 05/01/12 0905 05/01/12 0015  WBC 39.0* 33.3* 15.1*  HGB 10.0* 11.7* 12.0  PLT 154 172 190  LABCREA -- -- --  CREATININE 1.72* 0.93 0.74   Estimated Creatinine Clearance: 21 ml/min (by C-G formula based on Cr of 1.72). No results found for this basename: VANCOTROUGH:2,VANCOPEAK:2,VANCORANDOM:2,GENTTROUGH:2,GENTPEAK:2,GENTRANDOM:2,TOBRATROUGH:2,TOBRAPEAK:2,TOBRARND:2,AMIKACINPEAK:2,AMIKACINTROU:2,AMIKACIN:2, in the last 72 hours   Microbiology: No results found for this or any previous visit (from the past 720 hour(s)).  Medical History: Past Medical History  Diagnosis Date  . Hypertension   . Irregular heart beat     Medications:  Scheduled:    . docusate sodium  100 mg Oral BID  . lip balm  1 application Topical BID  . metoprolol  2.5 mg Intravenous Q6H  . pantoprazole (PROTONIX) IV  40 mg Intravenous Q24H  . [COMPLETED] sodium chloride  1,000 mL Intravenous Once  . sodium chloride  3 mL Intravenous Q12H  . sorbitol, milk of mag, mineral oil, glycerin (SMOG) enema  960 mL Rectal Once  . [DISCONTINUED] metoprolol  2.5 mg Intravenous Q6H   Assessment: 89 YOF admitted with SBo adn abd pain, WBC trending up. SCr has also increased.   1/5 >> Zosyn   >>   Tmax: 99.2 WBCs: 39 Renal: 1.72 C-G = 45ml/min  1/5  blood: 1/5 urine:   Dose changes/drug level info:   Goal of Therapy:  Renally adjust antibiotics  Plan:  - Due to age and worsening renal fx, zosyn 2.25gm IV q6h. Monitor renal function and adjust PRN  Dannielle Huh 05/02/2012,12:09 PM

## 2012-05-02 NOTE — Progress Notes (Signed)
Pt states she feels a little better, resting with family at bedside. Pt arrived to unit in Depends undergarment and states she is incontinent of urine at home.

## 2012-05-02 NOTE — Progress Notes (Signed)
I/O cath'd patient. 610 ML retrieved.

## 2012-05-03 ENCOUNTER — Inpatient Hospital Stay (HOSPITAL_COMMUNITY): Payer: Medicare Other

## 2012-05-03 LAB — CBC
HCT: 27.1 % — ABNORMAL LOW (ref 36.0–46.0)
Hemoglobin: 9.2 g/dL — ABNORMAL LOW (ref 12.0–15.0)
MCH: 27.7 pg (ref 26.0–34.0)
MCV: 81.6 fL (ref 78.0–100.0)
RBC: 3.32 MIL/uL — ABNORMAL LOW (ref 3.87–5.11)
WBC: 21.9 10*3/uL — ABNORMAL HIGH (ref 4.0–10.5)

## 2012-05-03 LAB — COMPREHENSIVE METABOLIC PANEL
AST: 38 U/L — ABNORMAL HIGH (ref 0–37)
Albumin: 2.6 g/dL — ABNORMAL LOW (ref 3.5–5.2)
BUN: 31 mg/dL — ABNORMAL HIGH (ref 6–23)
Creatinine, Ser: 0.86 mg/dL (ref 0.50–1.10)
Total Protein: 5.8 g/dL — ABNORMAL LOW (ref 6.0–8.3)

## 2012-05-03 MED ORDER — PIPERACILLIN-TAZOBACTAM 3.375 G IVPB
3.3750 g | Freq: Three times a day (TID) | INTRAVENOUS | Status: DC
  Administered 2012-05-03 – 2012-05-10 (×21): 3.375 g via INTRAVENOUS
  Filled 2012-05-03 (×24): qty 50

## 2012-05-03 MED ORDER — POTASSIUM CHLORIDE 10 MEQ/100ML IV SOLN
10.0000 meq | INTRAVENOUS | Status: AC
  Administered 2012-05-03 (×4): 10 meq via INTRAVENOUS
  Filled 2012-05-03 (×4): qty 100

## 2012-05-03 MED ORDER — PANTOPRAZOLE SODIUM 40 MG IV SOLR
40.0000 mg | Freq: Two times a day (BID) | INTRAVENOUS | Status: DC
  Administered 2012-05-04 – 2012-05-06 (×6): 40 mg via INTRAVENOUS
  Filled 2012-05-03 (×8): qty 40

## 2012-05-03 NOTE — Progress Notes (Signed)
Patient ID: [name], female   DOB: 11/03/1920, 79 y.o.   MRN: [id number]    Subjective: No significant changes overnight, denies pain, +flatus but no bm since enema, denies nausea/vomiting, still has foley in place  Objective: Vital signs in last 24 hours: Temp:  [97.8 F (36.6 C)-98.4 F (36.9 C)] 98.4 F (36.9 C) 05-06-2022 2139) Pulse Rate:  [60-92] 80  (01/06 0515) Resp:  [18] 18  05-06-2022 2139) BP: (107-122)/(38-43) 121/43 mmHg (01/06 0515) SpO2:  [94 %-95 %] 94 % (01/06 0515) Last BM Date: 2012-05-06  Intake/Output from previous day: May 06, 2022 0701 - 01/06 0700 In: 1400 [I.V.:400; IV Piggyback:1000] Out: 675 [Urine:675] Intake/Output this shift:    General: alert, no distress GI: soft, non tender, +bs   Extremities: no edema     Lab Results:   [facility] 05/03/12 0446 05/06/2012 0455  WBC 21.9* 39.0*  HGB 9.2* 10.0*  HCT 27.1* 29.1*  PLT 136* 154   BMET  Basename 05/03/12 0446 05-06-2012 0455  NA 132* 128*  K 3.3* 4.5  CL 98 92*  CO2 21 22  GLUCOSE 99 116*  BUN 31* 33*  CREATININE 0.86 1.72*  CALCIUM 8.4 8.7   PT/INR No results found for this basename: LABPROT:2,INR:2 in the last 72 hours ABG No results found for this basename: PHART:2,PCO2:2,PO2:2,HCO3:2 in the last 72 hours  Studies/Results: Dg Abd 2 Views  06-May-2012  *RADIOLOGY REPORT*  Clinical Data: Abdominal pain, weakness  ABDOMEN - 2 VIEW  Comparison: CT 05/01/2012  Findings: Nasogastric tube is appropriately positioned.  No free air on decubitus imaging.  Slight interval decrease in caliber of multiple dilated small bowel loops over the mid abdomen, maximal diameter now 4.1 cm.  Mild-moderate stool burden throughout nondilated colon.  Leftward curvature of the lumbar spine noted with atheromatous calcification of the aorta.  Retained contrast within the bladder.  IMPRESSION: Interval decrease in caliber of dilated small bowel loops, suggesting improvement in small bowel obstruction.   Original Report  Authenticated By: Christiana Pellant, M.D.     Anti-infectives: Anti-infectives     Start     Dose/Rate Route Frequency Ordered Stop   05/03/12 1200   piperacillin-tazobactam (ZOSYN) IVPB 3.375 g        3.375 g 12.5 mL/hr over 240 Minutes Intravenous Every 8 hours 05/03/12 0846     05/06/12 1300   piperacillin-tazobactam (ZOSYN) IVPB 2.25 g  Status:  Discontinued        2.25 g 100 mL/hr over 30 Minutes Intravenous 4 times per day 2012-05-06 1214 05/03/12 0846          Assessment/Plan: 1. SBO: having some flatus and films today seem to show improvement, will clamp NGT today and allow sips of clears, Leukocytosis improving, pt's abdominal exam relatively benign for WBC count, no UTI on U/A. Unclear source.   Dehydration and hyponatremia - correcting.  Continue NS.  --clamp NGT  --sips of clears  --monitor CBC closely and cont zosyn  --OOB and ambulate  LOS: 3 days    WHITE, ELIZABETH 05/03/2012  She feels better but abdomen is still mildly distended and tympanitic.  NG tube in esophagus.  I advanced it today.  Her xrays still show persistent small bowel dilation.  I would not clamp NG at this point.  Would continue NPO and suction.

## 2012-05-03 NOTE — Progress Notes (Signed)
[facility] PROGRESS NOTE  [name] ZOX:[id number] DOB: March 06, 1921 DOA: 04/30/2012 PCP: No primary provider on file.  Assessment/Plan: Principal Problem:  *SBO (small bowel obstruction) Active Problems:  Hyponatremia  HTN (hypertension)  Traumatic hematoma of foot  Leukocytosis    1. SBO (small bowel obstruction): Patient presented with about 24 hours of abdominal pain, and vomiting. Imaging studies demonstrated small bowel obstruction with transition zone in the anterior central pelvis, associated with bowel wall thickening at the point of obstruction. This could represent an adhesion, though subtle mass lesion is not excluded. There was increased stool throughout colon, and a moderate sized hiatal hernia. Dr Girtha Hake provided surgical consultation and has recommended bowel rest and enemas, NG-T and follow up abdominal films. Clinically, patient appears to be making progress, with decreased NG-T output, and decreased bowel caliber on X-Ray. Continue management per surgeon.  2. Hyponatremia: Sodium was 121 at presentation. This is likely secondary to dehydration, as well as pre-admission diuretic therapy. Diuretics are on hold. managing with iv NS, with improvement. Today, sodium is 131. Following lytes.  3. HTN (hypertension): Controlled on iv Lopressor with holding parameters and prn Hydralazine while n.p.o.  4. Painful right foot hematoma: Patient has a hematoma on the dorsum of her right foot about 4 cm in diameter, following a recent fall. Fortunately, no bony injuries on plain films. Clinically, there is no evidence of infection at this time. This is gradually resolving.  5. Acute urinary retention: This occurred overnight on 05/02/12, and was addressed with Foley catheter.  6. Leukocytosis: Patient had a high, persistent leukocytosis, although she is afebrile, CXR was devoid of infiltrates and urinalysis was negative. Commenced on empiric Zosyn on 05/02/12, now day#2. Blood  cultures grew GPR in 2:2, and wcc is trending down. Awaiting identification. 7. Sore throat/Dysphonia: Patient has a sore throat and hoarseness, after reinsertion of displaced NG-T on 05/02/11. Throat appears clear, without hyperemia or exudate. Managing with Chloraseptic spray.  8. AKI: Had jump in creatinine from 0.93 to 1.72, overnight on 05/02/12. Responded to iv fluids, and creatinine is 0.86 today.     Code Status: DNR/DNI Family Communication:  Disposition Plan: To be determined.    Brief narrative: 79 y.o. Female with history of Hypertension, arrhythmia, previous cataract extraction, IBS and chronic constipation, presenting with abdominal pain, nausea and vomiting since 04/30/12, without o diarrhea. She had some URI symptoms all last week. She has never had a colonoscopy done in her life. Admitted for further evaluation and management.     Consultants:  Almond Lint, surgeon.   Procedures:  CXR.  Abdominal X-Ray.  X-Ray right foot.  CT Abdomen/pelvis.   Antibiotics:  N/A.   HPI/Subjective: No new issues. No BM overnight. Passed flatus.   Objective: Vital signs in last 24 hours: Temp:  [97.8 F (36.6 C)-98.4 F (36.9 C)] 98.4 F (36.9 C) (01/05 2139) Pulse Rate:  [60-92] 80  (01/06 0515) Resp:  [18] 18  (01/05 2139) BP: (107-122)/(38-43) 121/43 mmHg (01/06 0515) SpO2:  [94 %-95 %] 94 % (01/06 0515) Weight change:  Last BM Date: 05/02/12  Intake/Output from previous day: 01/05 0701 - 01/06 0700 In: 1400 [I.V.:400; IV Piggyback:1000] Out: 675 [Urine:675] Total I/O In: -  Out: 800 [Urine:800]   Physical Exam: General: Comfortable, alert, communicative, fully oriented, not short of breath at rest. NG-T in situ.  HEENT:  No clinical pallor, no jaundice, no conjunctival injection or discharge. Hydration is fair.  NECK:  Supple, JVP not seen, no  carotid bruits, no palpable lymphadenopathy, no palpable goiter. CHEST:  Clinically clear to auscultation, no wheezes,  no crackles. HEART:  Sounds 1 and 2 heard, normal, regular, no murmurs. ABDOMEN:  Full, softy distended, non-tender, no palpable organomegaly, no palpable masses, normal bowel sounds. GENITALIA:  Not examined. LOWER EXTREMITIES:  No pitting edema, palpable peripheral pulses. Patient has a 4 cm diameter cystic , mildly tender swelling with overlying bruising, on the dorsum of her right foot, consistent with hematoma. Not clinically infected. Now visibly smaller.  MUSCULOSKELETAL SYSTEM:  Generalized osteoarthritic changes, otherwise, normal. CENTRAL NERVOUS SYSTEM:  No focal neurologic deficit on gross examination.  Lab Results:  [facility] 21-May-2012 0446 05/02/12 0455  WBC 21.9* 39.0*  HGB 9.2* 10.0*  HCT 27.1* 29.1*  PLT 136* 154    Basename 05-21-12 0446 05/02/12 0455  NA 132* 128*  K 3.3* 4.5  CL 98 92*  CO2 21 22  GLUCOSE 99 116*  BUN 31* 33*  CREATININE 0.86 1.72*  CALCIUM 8.4 8.7   Recent Results (from the past 240 hour(s))  CULTURE, BLOOD (ROUTINE X 2)     Status: Normal (Preliminary result)   Collection Time   05/02/12 12:14 PM      Component Value Range Status Comment   Specimen Description BLOOD LEFT ARM   Final    Special Requests     Final    Value: BOTTLES DRAWN AEROBIC AND ANAEROBIC 10CC BOTH BOTTLES   Culture  Setup Time 05/02/2012 17:19   Final    Culture     Final    Value: GRAM POSITIVE RODS     Note: Gram Stain Report Called to,Read Back By and Verified With: Mignon Pine RN on May 21, 2012 at 04:37 by Christie Nottingham   Report Status PENDING   Incomplete   CULTURE, BLOOD (ROUTINE X 2)     Status: Normal (Preliminary result)   Collection Time   05/02/12 12:14 PM      Component Value Range Status Comment   Specimen Description BLOOD LEFT ARM   Final    Special Requests     Final    Value: BOTTLES DRAWN AEROBIC AND ANAEROBIC 6CC BOTH BOTTLES   Culture  Setup Time 05/02/2012 17:20   Final    Culture     Final    Value: GRAM POSITIVE RODS     Note: Gram Stain Report  Called to,Read Back By and Verified With: Mignon Pine RN on 05/21/12 at 04:37 by Christie Nottingham   Report Status PENDING   Incomplete   URINE CULTURE     Status: Normal (Preliminary result)   Collection Time   05/02/12  2:03 PM      Component Value Range Status Comment   Specimen Description URINE, CATHETERIZED   Final    Special Requests none   Final    Culture  Setup Time 05/02/2012 17:24   Final    Colony Count >=100,000 COLONIES/ML   Final    Culture GRAM NEGATIVE RODS   Final    Report Status PENDING   Incomplete      Studies/Results: Dg Abd 2 Views  05-21-2012  *RADIOLOGY REPORT*  Clinical Data: Small bowel obstruction.  ABDOMEN - 2 VIEW  Comparison: 05/02/2012  Findings: Nasogastric tube positioning stable with the tip extending into the stomach.  Degree of dilatation of small bowel loops is relatively stable with more visible air filled small bowel loops present but no significant increase in maximal small bowel caliber.  There remains stool  and air throughout the colon.  No evidence of free air.  IMPRESSION: Relatively stable small bowel dilatation.  No evidence of bowel perforation.   Original Report Authenticated By: Irish Lack, M.D.    Dg Abd 2 Views  05/02/2012  *RADIOLOGY REPORT*  Clinical Data: Abdominal pain, weakness  ABDOMEN - 2 VIEW  Comparison: CT 05/01/2012  Findings: Nasogastric tube is appropriately positioned.  No free air on decubitus imaging.  Slight interval decrease in caliber of multiple dilated small bowel loops over the mid abdomen, maximal diameter now 4.1 cm.  Mild-moderate stool burden throughout nondilated colon.  Leftward curvature of the lumbar spine noted with atheromatous calcification of the aorta.  Retained contrast within the bladder.  IMPRESSION: Interval decrease in caliber of dilated small bowel loops, suggesting improvement in small bowel obstruction.   Original Report Authenticated By: Christiana Pellant, M.D.    Dg Abd Portable 1v  05/03/2012  *RADIOLOGY  REPORT*  Clinical Data: Evaluate NG tube placement.  PORTABLE ABDOMEN - 1 VIEW  Comparison: Earlier today at 0850 hours.  Findings: 1043 hours.  Nasogastric tube has been advanced. Terminates at the body of the stomach, with the side port just above the gastroesophageal junction.  Slight improvement in small bowel dilatation. No free intraperitoneal air.  Right hemidiaphragm elevation.  Mild cardiomegaly. T11 compression deformity is incidentally noted.  IMPRESSION: Advancement of nasogastric tube into the body of the stomach, with the side port just below the gastroesophageal junction.   Original Report Authenticated By: Jeronimo Greaves, M.D.     Medications: Scheduled Meds:    . docusate sodium  100 mg Oral BID  . lip balm  1 application Topical BID  . metoprolol  2.5 mg Intravenous Q6H  . pantoprazole (PROTONIX) IV  40 mg Intravenous Q24H  . piperacillin-tazobactam (ZOSYN)  IV  3.375 g Intravenous Q8H  . sodium chloride  3 mL Intravenous Q12H   Continuous Infusions:    . sodium chloride 100 mL/hr at 05/02/12 1200   PRN Meds:.acetaminophen, acetaminophen, alum & mag hydroxide-simeth, bisacodyl, hydrALAZINE, magic mouthwash, menthol-cetylpyridinium, morphine injection, ondansetron (ZOFRAN) IV, ondansetron, phenol    LOS: 3 days   Trayce Maino,CHRISTOPHER  [facility] Pager [phone]. If 8PM-8AM, please contact night-coverage at [email or web address], password [facility] 05/03/2012, 1:12 PM  LOS: 3 days

## 2012-05-03 NOTE — Progress Notes (Signed)
ANTIBIOTIC CONSULT NOTE - Follow Up  Pharmacy Consult for zosyn Indication: unknown source of leukocytosis, bacteremia  Allergies  Allergen Reactions  . Neosporin (Neomycin-Bacitracin Zn-Polymyx)   . Sulfa Antibiotics     Patient Measurements: Height: 5\' 4"  (162.6 cm) Weight: 149 lb 4 oz (67.7 kg) IBW/kg (Calculated) : 54.7   Vital Signs: Temp: 98.4 F (36.9 C) (01/05 2139) Temp src: Oral (01/05 2139) BP: 121/43 mmHg (01/06 0515) Pulse Rate: 80  (01/06 0515) Intake/Output from previous day: 01/05 0701 - 01/06 0700 In: 1400 [I.V.:400; IV Piggyback:1000] Out: 675 [Urine:675] Intake/Output from this shift:    Labs:  Basename 05/03/12 0446 05/02/12 0455 05/01/12 2316 05/01/12 0905  WBC 21.9* 39.0* -- 33.3*  HGB 9.2* 10.0* -- 11.7*  PLT 136* 154 -- 172  LABCREA -- -- 93.7 --  CREATININE 0.86 1.72* -- 0.93   Estimated Creatinine Clearance: 41.9 ml/min (by C-G formula based on Cr of 0.86).   Microbiology: Recent Results (from the past 720 hour(s))  CULTURE, BLOOD (ROUTINE X 2)     Status: Normal (Preliminary result)   Collection Time   05/02/12 12:14 PM      Component Value Range Status Comment   Specimen Description BLOOD LEFT ARM   Final    Special Requests     Final    Value: BOTTLES DRAWN AEROBIC AND ANAEROBIC 10CC BOTH BOTTLES   Culture  Setup Time 05/02/2012 17:19   Final    Culture     Final    Value: GRAM POSITIVE RODS     Note: Gram Stain Report Called to,Read Back By and Verified With: Mignon Pine RN on 05/03/12 at 04:37 by Christie Nottingham   Report Status PENDING   Incomplete   CULTURE, BLOOD (ROUTINE X 2)     Status: Normal (Preliminary result)   Collection Time   05/02/12 12:14 PM      Component Value Range Status Comment   Specimen Description BLOOD LEFT ARM   Final    Special Requests     Final    Value: BOTTLES DRAWN AEROBIC AND ANAEROBIC 6CC BOTH BOTTLES   Culture  Setup Time 05/02/2012 17:20   Final    Culture     Final    Value: GRAM POSITIVE RODS   Note: Gram Stain Report Called to,Read Back By and Verified With: Mignon Pine RN on 05/03/12 at 04:37 by Christie Nottingham   Report Status PENDING   Incomplete     Medical History: Past Medical History  Diagnosis Date  . Hypertension   . Irregular heart beat     Medications:  Scheduled:     . docusate sodium  100 mg Oral BID  . lip balm  1 application Topical BID  . metoprolol  2.5 mg Intravenous Q6H  . pantoprazole (PROTONIX) IV  40 mg Intravenous Q24H  . piperacillin-tazobactam (ZOSYN)  IV  2.25 g Intravenous Q6H  . [COMPLETED] sodium chloride  1,000 mL Intravenous Once  . sodium chloride  3 mL Intravenous Q12H  . [COMPLETED] sorbitol, milk of mag, mineral oil, glycerin (SMOG) enema  960 mL Rectal Once   Assessment:  78 YOF admitted with SBo adn abd pain, started on emperic Zosyn for leukocytosis  Blood cx 2/2 bottles Gram Pos Rods  Day #2 Zosyn 2.25gm IV q6h  SCr significantly improved from yesterday (1.72 > 0.86), WBC elevated but improving   Goal of Therapy:  Renally adjust antibiotics  Plan:   Since renal function appears improved, increase to  standard dose Zosyn 3.375gm IV q8h (4hr extended infusions)  Continue to follow renal function, cultures   Loralee Pacas, PharmD, BCPS Pager: [phone] 05/03/2012,8:42 AM

## 2012-05-03 NOTE — Progress Notes (Signed)
Notice that  bright red blood coming out from patients NGT in minimal amount. NGT was clamped and int suction was stopped at this time, patient no complaints of any pain or discomfort.. MD paged awaiting to call back.

## 2012-05-03 NOTE — Progress Notes (Signed)
Pt. Positive in both anerobic bottles gram positive with rods. MD notified.

## 2012-05-04 LAB — CBC
HCT: 27.3 % — ABNORMAL LOW (ref 36.0–46.0)
Hemoglobin: 9.2 g/dL — ABNORMAL LOW (ref 12.0–15.0)
MCH: 27.8 pg (ref 26.0–34.0)
MCV: 82.5 fL (ref 78.0–100.0)
RBC: 3.31 MIL/uL — ABNORMAL LOW (ref 3.87–5.11)

## 2012-05-04 LAB — CULTURE, BLOOD (ROUTINE X 2)

## 2012-05-04 LAB — BASIC METABOLIC PANEL
BUN: 29 mg/dL — ABNORMAL HIGH (ref 6–23)
CO2: 18 mEq/L — ABNORMAL LOW (ref 19–32)
Calcium: 8.5 mg/dL (ref 8.4–10.5)
Glucose, Bld: 87 mg/dL (ref 70–99)
Sodium: 137 mEq/L (ref 135–145)

## 2012-05-04 LAB — GLUCOSE, CAPILLARY
Glucose-Capillary: 86 mg/dL (ref 70–99)
Glucose-Capillary: 109 mg/dL — ABNORMAL HIGH (ref 70–99)

## 2012-05-04 NOTE — Care Management Note (Addendum)
    Page 1 of 2   05/17/2012     4:54:34 PM   CARE MANAGEMENT NOTE 05/17/2012  Patient:  [name], [name]   Account Number:  [id number]  Date Initiated:  05/01/2012  Documentation initiated by:  PEARSON,COOKIE  Subjective/Objective Assessment:   pt admitted with cco abd pain, found to have SBO, NGT placed     Action/Plan:   from home   Anticipated DC Date:  05/17/2012   Anticipated DC Plan:  SKILLED NURSING FACILITY      DC Planning Services  CM consult      Choice offered to / List presented to:             Status of service:  Completed, signed off Medicare Important Message given?  NA - LOS <3 / Initial given by admissions (If response is "NO", the following Medicare IM given date fields will be blank) Date Medicare IM given:   Date Additional Medicare IM given:    Discharge Disposition:  SKILLED NURSING FACILITY  Per UR Regulation:  Reviewed for med. necessity/level of care/duration of stay  If discussed at Long Length of Stay Meetings, dates discussed:   05/06/2012  05/11/2012  05/13/2012    Comments:  05/13/12 Maurica Omura RN,BSN NCM [phone] DIARRHEA,AWAIT C DIFF RESULTS.D/C SNF WHEN MED STABLE.  05/10/12 Marcel Gary RN,BSN NCM [phone] REG DIET,ELEVATED K/WBC-27K,IVF,IV ABX.D/C PLAN SNF.  05/07/12 Keats Kingry RN,BSN NCM [phone] NGT D/C,FULL LIQ,ID/GI FOLLOWING.IV ABX,IVF,IV LOPRESSOR.PT-SNF.D/C PLAN SNF.  05/04/12 Khaleesi Gruel RN,BSN NCM [phone] NGT,TRIAL CLAMPING,NO FURTHER SX.RECOMMEND PT CONS WHEN MD FEEL APPROPRIATE.WILL PROVIDE HHC AGENCY LIST AS RESOURCE. 05/01/12 MPearson, RN, BSN Chart reviewed.

## 2012-05-04 NOTE — Progress Notes (Signed)
[facility] PROGRESS NOTE  [name] ZOX:[id number] DOB: Feb 01, 1921 DOA: 04/30/2012 PCP: No primary provider on file.  Assessment/Plan: Principal Problem:  *SBO (small bowel obstruction) Active Problems:  Hyponatremia  HTN (hypertension)  Traumatic hematoma of foot  Leukocytosis    1. SBO (small bowel obstruction): Patient presented with about 24 hours of abdominal pain, and vomiting. Imaging studies demonstrated small bowel obstruction with transition zone in the anterior central pelvis, associated with bowel wall thickening at the point of obstruction. This could represent an adhesion, though subtle mass lesion is not excluded. There was increased stool throughout colon, and a moderate sized hiatal hernia. Dr Girtha Hake provided surgical consultation and has recommended bowel rest and enemas, NG-T and follow up abdominal films. Clinically, patient appears to be making progress, with decreased NG-T output, and decreased bowel caliber on X-Ray. Continue management per surgeon. NG-T has been clamped today.  2. Hyponatremia: Sodium was 121 at presentation. This is likely secondary to dehydration, as well as pre-admission diuretic therapy. Diuretics are on hold. managing with iv NS, with improvement. Today, sodium has normalized at 137. Following lytes.  3. HTN (hypertension): Controlled on iv Lopressor with holding parameters and prn Hydralazine while n.p.o.  4. Painful right foot hematoma: Patient has a hematoma on the dorsum of her right foot about 4 cm in diameter, following a recent fall. Fortunately, no bony injuries on plain films. Clinically, there is no evidence of infection at this time. This is gradually resolving.  5. Acute urinary retention: This occurred overnight on 05/02/12, and was addressed with Foley catheter. Trial of voiding today.  6. Leukocytosis: Patient had a high, persistent leukocytosis, although she is afebrile, CXR was devoid of infiltrates and urinalysis  was negative. Commenced on empiric Zosyn on 05/02/12, now day#2. Blood cultures grew GPR in 2:2, and wcc is trending down. Awaiting identification. Urine culture grew E. Coli.  7. UTI: See discussion in #6 above.  8. Sore throat/Dysphonia: Patient developed a sore throat and hoarseness, after reinsertion of displaced NG-T on 05/02/11. Throat appeared clear, without hyperemia or exudate. Managed successfuly with  with Chloraseptic spray.  9. AKI: Had jump in creatinine from 0.93 to 1.72, overnight on 05/02/12. Responded to iv fluids, and creatinine has normalized at 0.86 as of 05/03/12.     Code Status: DNR/DNI Family Communication:  Disposition Plan: To be determined.    Brief narrative: 79 y.o. Female with history of Hypertension, arrhythmia, previous cataract extraction, IBS and chronic constipation, presenting with abdominal pain, nausea and vomiting since 04/30/12, without o diarrhea. She had some URI symptoms all last week. She has never had a colonoscopy done in her life. Admitted for further evaluation and management.     Consultants:  Almond Lint, surgeon.   Procedures:  CXR.  Abdominal X-Ray.  X-Ray right foot.  CT Abdomen/pelvis.   Antibiotics:  N/A.   HPI/Subjective: No new issues. Had BM overnight.   Objective: Vital signs in last 24 hours: Temp:  [98 F (36.7 C)-99.4 F (37.4 C)] 98 F (36.7 C) (01/07 0514) Pulse Rate:  [66-85] 66  (01/07 0514) Resp:  [18-20] 20  (01/07 0514) BP: (135-145)/(47-68) 141/53 mmHg (01/07 0514) SpO2:  [94 %-95 %] 95 % (01/07 0514) Weight change:  Last BM Date: 05/03/12  Intake/Output from previous day: 01/06 0701 - 01/07 0700 In: 3947.9 [I.V.:3447.9; IV Piggyback:500] Out: 2010 [Urine:1750; Emesis/NG output:260]     Physical Exam: General: Comfortable, alert, communicative, fully oriented, not short of breath at rest. NG-T  in situ/Clamped.  HEENT:  No clinical pallor, no jaundice, no conjunctival injection or discharge.  Hydration is fair.  NECK:  Supple, JVP not seen, no carotid bruits, no palpable lymphadenopathy, no palpable goiter. CHEST:  Clinically clear to auscultation, no wheezes, no crackles. HEART:  Sounds 1 and 2 heard, normal, regular, no murmurs. ABDOMEN:  Full, soft, non-tender, no palpable organomegaly, no palpable masses, normal bowel sounds. GENITALIA:  Not examined. LOWER EXTREMITIES:  No pitting edema, palpable peripheral pulses. Patient has a 4 cm diameter cystic , mildly tender swelling with overlying bruising, on the dorsum of her right foot, consistent with hematoma. Not clinically infected. Now visibly smaller.  MUSCULOSKELETAL SYSTEM:  Generalized osteoarthritic changes, otherwise, normal. CENTRAL NERVOUS SYSTEM:  No focal neurologic deficit on gross examination.  Lab Results:  Basename 05/04/12 0522 05-18-2012 0446  WBC 15.7* 21.9*  HGB 9.2* 9.2*  HCT 27.3* 27.1*  PLT 149* 136*    Basename 05/04/12 0522 18-May-2012 0446  NA 137 132*  K 3.3* 3.3*  CL 104 98  CO2 18* 21  GLUCOSE 87 99  BUN 29* 31*  CREATININE 0.68 0.86  CALCIUM 8.5 8.4   Recent Results (from the past 240 hour(s))  CULTURE, BLOOD (ROUTINE X 2)     Status: Normal   Collection Time   05/02/12 12:14 PM      Component Value Range Status Comment   Specimen Description BLOOD LEFT ARM   Final    Special Requests     Final    Value: BOTTLES DRAWN AEROBIC AND ANAEROBIC 10CC BOTH BOTTLES   Culture  Setup Time 05/02/2012 17:19   Final    Culture     Final    Value: CLOSTRIDIUM PERFRINGENS     Note: Gram Stain Report Called to,Read Back By and Verified With: Mignon Pine RN on 2012/05/18 at 04:37 by Christie Nottingham   Report Status 05/04/2012 FINAL   Final   CULTURE, BLOOD (ROUTINE X 2)     Status: Normal   Collection Time   05/02/12 12:14 PM      Component Value Range Status Comment   Specimen Description BLOOD LEFT ARM   Final    Special Requests     Final    Value: BOTTLES DRAWN AEROBIC AND ANAEROBIC 6CC BOTH BOTTLES    Culture  Setup Time 05/02/2012 17:20   Final    Culture     Final    Value: CLOSTRIDIUM PERFRINGENS     Note: Gram Stain Report Called to,Read Back By and Verified With: Mignon Pine RN on 05-18-2012 at 04:37 by Christie Nottingham   Report Status 05/04/2012 FINAL   Final   URINE CULTURE     Status: Normal (Preliminary result)   Collection Time   05/02/12  2:03 PM      Component Value Range Status Comment   Specimen Description URINE, CATHETERIZED   Final    Special Requests none   Final    Culture  Setup Time 05/02/2012 17:24   Final    Colony Count >=100,000 COLONIES/ML   Final    Culture ESCHERICHIA COLI   Final    Report Status PENDING   Incomplete    Organism ID, Bacteria ESCHERICHIA COLI   Final      Studies/Results: Dg Abd 2 Views  2012-05-18  *RADIOLOGY REPORT*  Clinical Data: Small bowel obstruction.  ABDOMEN - 2 VIEW  Comparison: 05/02/2012  Findings: Nasogastric tube positioning stable with the tip extending into the stomach.  Degree  of dilatation of small bowel loops is relatively stable with more visible air filled small bowel loops present but no significant increase in maximal small bowel caliber.  There remains stool and air throughout the colon.  No evidence of free air.  IMPRESSION: Relatively stable small bowel dilatation.  No evidence of bowel perforation.   Original Report Authenticated By: Irish Lack, M.D.    Dg Abd Portable 1v  05/03/2012  *RADIOLOGY REPORT*  Clinical Data: Evaluate NG tube placement.  PORTABLE ABDOMEN - 1 VIEW  Comparison: Earlier today at 0850 hours.  Findings: 1043 hours.  Nasogastric tube has been advanced. Terminates at the body of the stomach, with the side port just above the gastroesophageal junction.  Slight improvement in small bowel dilatation. No free intraperitoneal air.  Right hemidiaphragm elevation.  Mild cardiomegaly. T11 compression deformity is incidentally noted.  IMPRESSION: Advancement of nasogastric tube into the body of the stomach, with the  side port just below the gastroesophageal junction.   Original Report Authenticated By: Jeronimo Greaves, M.D.     Medications: Scheduled Meds:    . docusate sodium  100 mg Oral BID  . lip balm  1 application Topical BID  . metoprolol  2.5 mg Intravenous Q6H  . pantoprazole (PROTONIX) IV  40 mg Intravenous Q12H  . piperacillin-tazobactam (ZOSYN)  IV  3.375 g Intravenous Q8H  . sodium chloride  3 mL Intravenous Q12H   Continuous Infusions:    . sodium chloride 1,000 mL (05/04/12 0225)   PRN Meds:.acetaminophen, acetaminophen, alum & mag hydroxide-simeth, bisacodyl, hydrALAZINE, magic mouthwash, menthol-cetylpyridinium, morphine injection, ondansetron (ZOFRAN) IV, ondansetron, phenol    LOS: 4 days   Shirell Struthers,CHRISTOPHER  [facility] Pager [phone]. If 8PM-8AM, please contact night-coverage at [email or web address], password [facility] 05/04/2012, 1:15 PM  LOS: 4 days

## 2012-05-04 NOTE — Progress Notes (Signed)
Subjective: Has had a few BM's overnight. No nausea  Objective: Vital signs in last 24 hours: Temp:  [98 F (36.7 C)-99.4 F (37.4 C)] 98 F (36.7 C) (01/07 0514) Pulse Rate:  [66-85] 66  (01/07 0514) Resp:  [18-20] 20  (01/07 0514) BP: (135-145)/(47-68) 141/53 mmHg (01/07 0514) SpO2:  [94 %-95 %] 95 % (01/07 0514) Last BM Date: 05/09/2012  Intake/Output from previous day: 2022-05-09 0701 - 01/07 0700 In: 3947.9 [I.V.:3447.9; IV Piggyback:500] Out: 2010 [Urine:1750; Emesis/NG output:260] Intake/Output this shift:    General appearance: alert, cooperative and no distress GI: soft, NT, she still has some mild distension and tympany, NG with thin brown output  Lab Results:   Basename 05/04/12 0522 05-09-12 0446  WBC 15.7* 21.9*  HGB 9.2* 9.2*  HCT 27.3* 27.1*  PLT 149* 136*   BMET  Basename 05/04/12 0522 05-09-12 0446  NA 137 132*  K 3.3* 3.3*  CL 104 98  CO2 18* 21  GLUCOSE 87 99  BUN 29* 31*  CREATININE 0.68 0.86  CALCIUM 8.5 8.4   PT/INR No results found for this basename: LABPROT:2,INR:2 in the last 72 hours ABG No results found for this basename: PHART:2,PCO2:2,PO2:2,HCO3:2 in the last 72 hours  Studies/Results: Dg Abd 2 Views  05/09/2012  *RADIOLOGY REPORT*  Clinical Data: Small bowel obstruction.  ABDOMEN - 2 VIEW  Comparison: 05/02/2012  Findings: Nasogastric tube positioning stable with the tip extending into the stomach.  Degree of dilatation of small bowel loops is relatively stable with more visible air filled small bowel loops present but no significant increase in maximal small bowel caliber.  There remains stool and air throughout the colon.  No evidence of free air.  IMPRESSION: Relatively stable small bowel dilatation.  No evidence of bowel perforation.   Original Report Authenticated By: Irish Lack, M.D.    Dg Abd Portable 1v  05-09-12  *RADIOLOGY REPORT*  Clinical Data: Evaluate NG tube placement.  PORTABLE ABDOMEN - 1 VIEW  Comparison: Earlier  today at 0850 hours.  Findings: 1043 hours.  Nasogastric tube has been advanced. Terminates at the body of the stomach, with the side port just above the gastroesophageal junction.  Slight improvement in small bowel dilatation. No free intraperitoneal air.  Right hemidiaphragm elevation.  Mild cardiomegaly. T11 compression deformity is incidentally noted.  IMPRESSION: Advancement of nasogastric tube into the body of the stomach, with the side port just below the gastroesophageal junction.   Original Report Authenticated By: Jeronimo Greaves, M.D.     Anti-infectives: Anti-infectives     Start     Dose/Rate Route Frequency Ordered Stop   05-09-12 1200  piperacillin-tazobactam (ZOSYN) IVPB 3.375 g       3.375 g 12.5 mL/hr over 240 Minutes Intravenous Every 8 hours May 09, 2012 0846     05/02/12 1300   piperacillin-tazobactam (ZOSYN) IVPB 2.25 g  Status:  Discontinued        2.25 g 100 mL/hr over 30 Minutes Intravenous 4 times per day 05/02/12 1214 05-09-12 0846          Assessment/Plan: s/p * No surgery found * she still has some mild distension and tympany and I am not sure that she is out of the woods from her bowel obstruction but she has been moving her bowels and not much out of NG tube (though not sure if it has been on all night) so will trial clamping.  Consider DC foley and ambulate.  LOS: 4 days    [name] DAVID  05/04/2012  

## 2012-05-05 DIAGNOSIS — D72829 Elevated white blood cell count, unspecified: Secondary | ICD-10-CM

## 2012-05-05 DIAGNOSIS — E876 Hypokalemia: Secondary | ICD-10-CM

## 2012-05-05 LAB — GLUCOSE, CAPILLARY: Glucose-Capillary: 115 mg/dL — ABNORMAL HIGH (ref 70–99)

## 2012-05-05 LAB — CBC
HCT: 31.7 % — ABNORMAL LOW (ref 36.0–46.0)
Hemoglobin: 10.8 g/dL — ABNORMAL LOW (ref 12.0–15.0)
MCHC: 34.1 g/dL (ref 30.0–36.0)
MCV: 83 fL (ref 78.0–100.0)

## 2012-05-05 LAB — BASIC METABOLIC PANEL
BUN: 25 mg/dL — ABNORMAL HIGH (ref 6–23)
Chloride: 107 mEq/L (ref 96–112)
GFR calc non Af Amer: 75 mL/min — ABNORMAL LOW (ref >90)
Glucose, Bld: 117 mg/dL — ABNORMAL HIGH (ref 70–99)
Potassium: 2.8 mEq/L — ABNORMAL LOW (ref 3.5–5.1)

## 2012-05-05 MED ORDER — POTASSIUM CHLORIDE 10 MEQ/100ML IV SOLN
10.0000 meq | INTRAVENOUS | Status: AC
  Administered 2012-05-05 (×3): 10 meq via INTRAVENOUS
  Filled 2012-05-05 (×3): qty 100

## 2012-05-05 MED ORDER — POTASSIUM CHLORIDE CRYS ER 10 MEQ PO TBCR
10.0000 meq | EXTENDED_RELEASE_TABLET | Freq: Three times a day (TID) | ORAL | Status: DC
  Filled 2012-05-05 (×4): qty 1

## 2012-05-05 MED ORDER — POTASSIUM CHLORIDE CRYS ER 20 MEQ PO TBCR
40.0000 meq | EXTENDED_RELEASE_TABLET | ORAL | Status: AC
  Administered 2012-05-05 (×2): 40 meq via ORAL
  Filled 2012-05-05 (×2): qty 2

## 2012-05-05 NOTE — Consult Note (Signed)
[facility]     Reason for Consult: C. perfringens in blood    Referring Physician: Dr. Suanne Marker  Principal Problem:  *SBO (small bowel obstruction) Active Problems:  Hyponatremia  HTN (hypertension)  Traumatic hematoma of foot  Leukocytosis      . docusate sodium  100 mg Oral BID  . lip balm  1 application Topical BID  . metoprolol  2.5 mg Intravenous Q6H  . pantoprazole (PROTONIX) IV  40 mg Intravenous Q12H  . piperacillin-tazobactam (ZOSYN)  IV  3.375 g Intravenous Q8H  . potassium chloride  10 mEq Intravenous Q1 Hr x 3  . sodium chloride  3 mL Intravenous Q12H    Recommendations: Repeat blood cultures to assure it is not persistent  Continue Zosyn for now pending clinical improvement for possible abdominal pathology Consider CT scan of abdomen if she clinical decompensates  Assessment: C. Perfringens is a toxin producing organism and gas producing when pathologic.  Bacteremia with no obvious toxin production or abdominal wall gas (no colonic necrosis/gas gangrene, typhlitis) is not likely significant.  Likely this is a transient result of the SBO.  As long as she remains stable, with no bowel perforation or clinical changes, no indication for prolonged treatment.    Antibiotics: Zosyn day 4  HPI: [name] is a 79 y.o. female with remote history of gyn surgery presented on 1/4 with acute n/v and noted to have SBO on CT scan.  She had no fever, no chills.  She does not remember much about it.  No history of SBO in past.  It was acute in onset.  Has a history of IBS.  Recent URI symptoms.  She reports frequent UTIs, though does not apparently have many symptoms with them.  UA negative for infection on admission.  Urine culture c/w colonization with a negative UA.     Review of Systems: Pertinent items are noted in HPI.  Past Medical History  Diagnosis Date  . Hypertension   . Irregular heart beat     History  Substance Use  Topics  . Smoking status: Never Smoker   . Smokeless tobacco: Not on file  . Alcohol Use: No    Family History  Problem Relation Age of Onset  . Heart disease Mother   . Stroke Father    Allergies  Allergen Reactions  . Neosporin (Neomycin-Bacitracin Zn-Polymyx)   . Sulfa Antibiotics     OBJECTIVE: Blood pressure 141/59, pulse 78, temperature 97.6 F (36.4 C), temperature source Oral, resp. rate 18, height 5\' 4"  (1.626 m), weight 149 lb 4 oz (67.7 kg), SpO2 96.00%. General: Awake, alert, nad Skin: no rashes Lungs: CTA B Cor: RRR without m/r/g Abdomen: Soft, mildly distended, + normoactive bowel sounds   Microbiology: Recent Results (from the past 240 hour(s))  CULTURE, BLOOD (ROUTINE X 2)     Status: Normal   Collection Time   05/02/12 12:14 PM      Component Value Range Status Comment   Specimen Description BLOOD LEFT ARM   Final    Special Requests     Final    Value: BOTTLES DRAWN AEROBIC AND ANAEROBIC 10CC BOTH BOTTLES   Culture  Setup Time 05/02/2012 17:19   Final    Culture     Final    Value: CLOSTRIDIUM PERFRINGENS     Note: Gram Stain Report Called to,Read Back By and Verified With: Mignon Pine RN on 05/03/12 at 04:37 by Christie Nottingham  Report Status 05/04/2012 FINAL   Final   CULTURE, BLOOD (ROUTINE X 2)     Status: Normal   Collection Time   05/02/12 12:14 PM      Component Value Range Status Comment   Specimen Description BLOOD LEFT ARM   Final    Special Requests     Final    Value: BOTTLES DRAWN AEROBIC AND ANAEROBIC 6CC BOTH BOTTLES   Culture  Setup Time 05/02/2012 17:20   Final    Culture     Final    Value: CLOSTRIDIUM PERFRINGENS     Note: Gram Stain Report Called to,Read Back By and Verified With: Mignon Pine RN on 05/03/12 at 04:37 by Christie Nottingham   Report Status 05/04/2012 FINAL   Final   URINE CULTURE     Status: Normal (Preliminary result)   Collection Time   05/02/12  2:03 PM      Component Value Range Status Comment   Specimen Description URINE,  CATHETERIZED   Final    Special Requests none   Final    Culture  Setup Time 05/02/2012 17:24   Final    Colony Count >=100,000 COLONIES/ML   Final    Culture ESCHERICHIA COLI   Final    Report Status PENDING   Incomplete    Organism ID, Bacteria ESCHERICHIA COLI   Final     Staci Righter, MD [facility] [phone] pager  [phone] cell 05/05/2012, 2:49 PM

## 2012-05-05 NOTE — Progress Notes (Signed)
[facility] PROGRESS NOTE  [name] AVW:[id number] DOB: 1920-11-26 DOA: 04/30/2012 PCP: No primary provider on file.  Assessment/Plan: Principal Problem:  *SBO (small bowel obstruction) Active Problems:  Hyponatremia  HTN (hypertension)  Traumatic hematoma of foot  Leukocytosis    1. SBO (small bowel obstruction): Patient presented with about 24 hours of abdominal pain, and vomiting. Imaging studies demonstrated small bowel obstruction with transition zone in the anterior central pelvis, associated with bowel wall thickening at the point of obstruction. This could represent an adhesion, though subtle mass lesion is not excluded. There was increased stool throughout colon, and a moderate sized hiatal hernia. Dr Girtha Hake provided surgical consultation and has recommended bowel rest and enemas, NG-T and follow up abdominal films. Clinically, patient appears to be making progress, with decreased NG-T output, and decreased bowel caliber on X-Ray.Continue management per surgeon.  NG-T has been remainsclamped today.  -started on clears per surgery 2. Hyponatremia: Sodium was 121 at presentation. This is likely secondary to dehydration, as well as pre-admission diuretic therapy. Diuretics are on hold. managing with iv NS, with improvement.  sodium has normalized at 137 on1/7.  -Follow recheck.  3. HTN (hypertension): Controlled on iv Lopressor with holding parameters and prn Hydralazine until tolerating po well.  4. Painful right foot hematoma: Patient has a hematoma on the dorsum of her right foot about 4 cm in diameter, following a recent fall. Fortunately, no bony injuries on plain films. Clinically, there is no evidence of infection at this time. This is gradually resolving.  5. Acute urinary retention: This occurred overnight on 05/02/12, and was addressed with Foley catheter. Trial of voiding.  6. Leukocytosis: Patient had a high, persistent leukocytosis, although she is  afebrile, CXR was devoid of infiltrates and urinalysis was negative. Commenced on empiric Zosyn on 05/02/12.  -Blood cultures grew GPR in identified as clostridium perfringens, I have consulted ID for further recs.  -Urine culture grew E. Coli.  7. UTI, Ecoli: See discussion in #6 above.  8.Clostridium perfringens Bacteremia - on zosyn, I have consulted ID 8. Sore throat/Dysphonia: Patient developed a sore throat and hoarseness, after reinsertion of displaced NG-T on 05/02/11. Throat appeared clear, without hyperemia or exudate. Managed successfuly with  with Chloraseptic spray.  9. AKI: Had jump in creatinine from 0.93 to 1.72, overnight on 05/02/12. Responded to iv fluids, and creatinine has normalized at 0.86 as of 05/03/12- and reamins wnl.     Code Status: DNR/DNI Family Communication: family at bedside Disposition Plan: To be determined.    Brief narrative: 79 y.o. Female with history of Hypertension, arrhythmia, previous cataract extraction, IBS and chronic constipation, presenting with abdominal pain, nausea and vomiting since 04/30/12, without o diarrhea. She had some URI symptoms all last week. She has never had a colonoscopy done in her life. Admitted for further evaluation and management.     Consultants:  Almond Lint, surgeon.   Procedures:  CXR.  Abdominal X-Ray.  X-Ray right foot.  CT Abdomen/pelvis.   Antibiotics:  N/A.   HPI/Subjective: C/o bad after taste from kcl, denies n/v, denies abd pain Objective: Vital signs in last 24 hours: Temp:  [97.6 F (36.4 C)-99.1 F (37.3 C)] 97.6 F (36.4 C) (01/08 0648) Pulse Rate:  [74-119] 119  (01/08 0648) Resp:  [18-22] 22  (01/08 0648) BP: (149-156)/(56-80) 149/56 mmHg (01/08 0648) SpO2:  [95 %-97 %] 97 % (01/08 0648) Weight change:  Last BM Date: 05/03/12  Intake/Output from previous day: 01/07 0701 - 01/08  0700 In: 860 [I.V.:750; IV Piggyback:110] Out: 600 [Urine:600]     Physical Exam: General: Comfortable,  alert, communicative, fully oriented, not short of breath at rest. NG-T in place/Clamped.  HEENT:  No clinical pallor, no jaundice, no conjunctival injection or discharge. Hydration is fair.  NECK:  Supple, JVP not seen, no carotid bruits, no palpable lymphadenopathy, no palpable goiter. CHEST:  Clinically clear to auscultation, no wheezes, no crackles. HEART:  Sounds 1 and 2 heard, normal, regular, no murmurs. ABDOMEN:  Full, soft, non-tender, no palpable organomegaly, no palpable masses, normal bowel sounds. GENITALIA:  Not examined. LOWER EXTREMITIES:  No  edema, palpable peripheral pulses. Patient has a 4 cm diameter cystic , mildly tender swelling with overlying bruising, on the dorsum of her right foot, consistent with hematoma. Not clinically infected. Now visibly smaller.  CENTRAL NERVOUS SYSTEM:  No focal neurologic deficit on gross examination.  Lab Results:  Basename 05/05/12 0507 05/04/12 0522  WBC 18.0* 15.7*  HGB 10.8* 9.2*  HCT 31.7* 27.3*  PLT 165 149*    Basename 05/05/12 0507 05/04/12 0522  NA 142 137  K 2.8* 3.3*  CL 107 104  CO2 20 18*  GLUCOSE 117* 87  BUN 25* 29*  CREATININE 0.69 0.68  CALCIUM 8.9 8.5   Recent Results (from the past 240 hour(s))  CULTURE, BLOOD (ROUTINE X 2)     Status: Normal   Collection Time   05/02/12 12:14 PM      Component Value Range Status Comment   Specimen Description BLOOD LEFT ARM   Final    Special Requests     Final    Value: BOTTLES DRAWN AEROBIC AND ANAEROBIC 10CC BOTH BOTTLES   Culture  Setup Time 05/02/2012 17:19   Final    Culture     Final    Value: CLOSTRIDIUM PERFRINGENS     Note: Gram Stain Report Called to,Read Back By and Verified With: Mignon Pine RN on 05/03/12 at 04:37 by Christie Nottingham   Report Status 05/04/2012 FINAL   Final   CULTURE, BLOOD (ROUTINE X 2)     Status: Normal   Collection Time   05/02/12 12:14 PM      Component Value Range Status Comment   Specimen Description BLOOD LEFT ARM   Final    Special  Requests     Final    Value: BOTTLES DRAWN AEROBIC AND ANAEROBIC 6CC BOTH BOTTLES   Culture  Setup Time 05/02/2012 17:20   Final    Culture     Final    Value: CLOSTRIDIUM PERFRINGENS     Note: Gram Stain Report Called to,Read Back By and Verified With: Mignon Pine RN on 05/03/12 at 04:37 by Christie Nottingham   Report Status 05/04/2012 FINAL   Final   URINE CULTURE     Status: Normal (Preliminary result)   Collection Time   05/02/12  2:03 PM      Component Value Range Status Comment   Specimen Description URINE, CATHETERIZED   Final    Special Requests none   Final    Culture  Setup Time 05/02/2012 17:24   Final    Colony Count >=100,000 COLONIES/ML   Final    Culture ESCHERICHIA COLI   Final    Report Status PENDING   Incomplete    Organism ID, Bacteria ESCHERICHIA COLI   Final      Studies/Results: Dg Abd Portable 1v  05/03/2012  *RADIOLOGY REPORT*  Clinical Data: Evaluate NG tube placement.  PORTABLE  ABDOMEN - 1 VIEW  Comparison: Earlier today at 0850 hours.  Findings: 1043 hours.  Nasogastric tube has been advanced. Terminates at the body of the stomach, with the side port just above the gastroesophageal junction.  Slight improvement in small bowel dilatation. No free intraperitoneal air.  Right hemidiaphragm elevation.  Mild cardiomegaly. T11 compression deformity is incidentally noted.  IMPRESSION: Advancement of nasogastric tube into the body of the stomach, with the side port just below the gastroesophageal junction.   Original Report Authenticated By: Jeronimo Greaves, M.D.     Medications: Scheduled Meds:    . docusate sodium  100 mg Oral BID  . lip balm  1 application Topical BID  . metoprolol  2.5 mg Intravenous Q6H  . pantoprazole (PROTONIX) IV  40 mg Intravenous Q12H  . piperacillin-tazobactam (ZOSYN)  IV  3.375 g Intravenous Q8H  . potassium chloride  10 mEq Intravenous Q1 Hr x 3  . potassium chloride  40 mEq Oral Q4H  . sodium chloride  3 mL Intravenous Q12H   Continuous  Infusions:    . sodium chloride 75 mL/hr (05/04/12 1724)   PRN Meds:.acetaminophen, acetaminophen, alum & mag hydroxide-simeth, bisacodyl, hydrALAZINE, magic mouthwash, menthol-cetylpyridinium, morphine injection, ondansetron (ZOFRAN) IV, ondansetron, phenol    LOS: 5 days   [facility]  [facility] Pager [phone]. If 8PM-8AM, please contact night-coverage at [email or web address], password [facility] 05/05/2012, 9:43 AM  LOS: 5 days

## 2012-05-05 NOTE — Progress Notes (Signed)
  Subjective: Still complaining of some discomfort, rather vague what it is.  She is not getting OOB, the NG is bothering her, and her abd still seems a bit distended.  No nausea or vomiting with the NG clamped all day yesterday.  Objective: Vital signs in last 24 hours: Temp:  [97.6 F (36.4 C)-99.1 F (37.3 C)] 97.6 F (36.4 C) (01/08 0648) Pulse Rate:  [74-119] 119  (01/08 0648) Resp:  [18-22] 22  (01/08 0648) BP: (149-156)/(56-80) 149/56 mmHg (01/08 0648) SpO2:  [95 %-97 %] 97 % (01/08 0648) Last BM Date: 05/03/12  260 from NG yesterday, 2 BM's yesterday and 3 the day prior. NPO   Afebrile, VSS, K+2.8, WBC is 18K, blood cultures; CLOSTRIDIUM PERFRINGENS Urine culture: E. Coli >100K, Intake/Output from previous day: 01/07 0701 - 01/08 0700 In: 860 [I.V.:750; IV Piggyback:110] Out: 600 [Urine:600] Intake/Output this shift:    General appearance: alert, cooperative and no distress GI: soft, mildly distended, not really tender, + BM x 3 yesterday.  Lab Results:   [facility] 05/05/12 0507 05/04/12 0522  WBC 18.0* 15.7*  HGB 10.8* 9.2*  HCT 31.7* 27.3*  PLT 165 149*    BMET  Basename 05/05/12 0507 05/04/12 0522  NA 142 137  K 2.8* 3.3*  CL 107 104  CO2 20 18*  GLUCOSE 117* 87  BUN 25* 29*  CREATININE 0.69 0.68  CALCIUM 8.9 8.5   PT/INR No results found for this basename: LABPROT:2,INR:2 in the last 72 hours   Lab 05/03/12 0446 05/01/12 0905 05/01/12 0015  AST 38* 29 28  ALT 13 15 14   ALKPHOS 70 70 86  BILITOT 0.8 0.9 0.7  PROT 5.8* 7.0 8.4*  ALBUMIN 2.6* 3.5 4.3     Lipase     Component Value Date/Time   LIPASE 32 05/01/2012 0015     Studies/Results: Dg Abd Portable 1v  05/03/2012  *RADIOLOGY REPORT*  Clinical Data: Evaluate NG tube placement.  PORTABLE ABDOMEN - 1 VIEW  Comparison: Earlier today at 0850 hours.  Findings: 1043 hours.  Nasogastric tube has been advanced. Terminates at the body of the stomach, with the side port just above the  gastroesophageal junction.  Slight improvement in small bowel dilatation. No free intraperitoneal air.  Right hemidiaphragm elevation.  Mild cardiomegaly. T11 compression deformity is incidentally noted.  IMPRESSION: Advancement of nasogastric tube into the body of the stomach, with the side port just below the gastroesophageal junction.   Original Report Authenticated By: Jeronimo Greaves, M.D.     Medications:    . docusate sodium  100 mg Oral BID  . lip balm  1 application Topical BID  . metoprolol  2.5 mg Intravenous Q6H  . pantoprazole (PROTONIX) IV  40 mg Intravenous Q12H  . piperacillin-tazobactam (ZOSYN)  IV  3.375 g Intravenous Q8H  . sodium chloride  3 mL Intravenous Q12H    Assessment/Plan SBO/ No surgical history Hypertension Hyponatremia Right foot hematoma Urinary retention/UTI  Hypokalemia  K+2.8  Plan:  Foley is out,  She still feels bad, blood cultures:CLOSTRIDIUM PERFRINGENS E coli from the Urine culture. K+ is very low and I will start replacing, she had no nausea with her NG clamped yesterday, so we will start some clears.  This may be more ileus than SBO. I will also talk to Dr. Brien Few about her. Check Mg+ now and recheck labs in AM.  Add PT to help mobilize.    LOS: 5 days    [name] 05/05/2012

## 2012-05-05 NOTE — Progress Notes (Signed)
Pt seen and examined.  Tolerated NG clamping.  I think that it would be okay to remove the NG and see how she does.  She still has some mild distension but seems to be doing okay.

## 2012-05-05 NOTE — Progress Notes (Signed)
Attempted numerous times to convince pt to sit on side of bed or bedside commode, trying to progress to ambulation. Daughter even offered encouragements, but pt continued to refuse stating " just too weak right now".

## 2012-05-05 NOTE — Evaluation (Signed)
Physical Therapy Evaluation Patient Details Name: ZIONAH CRISWELL MRN: [id number] DOB: June 09, 1920 Today's Date: 05/05/2012 Time: 8657-8469 PT Time Calculation (min): 40 min  PT Assessment / Plan / Recommendation Clinical Impression  Pt  was admitted 04/30/12 with N/V abdominal pain.Marland Kitchen Pt found to have SBO, Pt has a hematoma of R foot . Pt lived at home alone with some increased help from family as pt became sick. Pt. may benefit from SNF at [place]. continue PT while in acute care to improve function.     PT Assessment  Patient needs continued PT services    Follow Up Recommendations  SNF;Supervision/Assistance - 24 hour    Does the patient have the potential to tolerate intense rehabilitation      Barriers to Discharge        Equipment Recommendations  None recommended by PT    Recommendations for Other Services     Frequency Min 3X/week    Precautions / Restrictions Precautions Precautions: Fall Precaution Comments: incontinent of B/B, NG suction   Pertinent Vitals/Pain States bottom is sore.     Mobility  Bed Mobility Bed Mobility: Rolling Right;Rolling Left;Left Sidelying to Sit Rolling Right: 3: Mod assist Rolling Left: 3: Mod assist Left Sidelying to Sit: 2: Max assist Details for Bed Mobility Assistance: pt required assistance to get to upright posture. Transfers Transfers: Sit to Stand;Stand to [employer] Transfers Sit to Stand: 2: Max assist;From bed Stand to Sit: To chair/3-in-1;3: Mod assist Stand Pivot Transfers: 1: +2 Total assist Stand Pivot Transfers: Patient Percentage: 60% Details for Transfer Assistance: pt has knees flexed  takes tiny steps,  Ambulation/Gait Ambulation/Gait Assistance: Not tested (comment)    Shoulder Instructions     Exercises     PT Diagnosis: Difficulty walking;Generalized weakness  PT Problem List: Decreased strength;Decreased activity tolerance;Decreased mobility;Decreased knowledge of precautions;Decreased knowledge of  use of DME PT Treatment Interventions: DME instruction;Gait training;Functional mobility training;Therapeutic activities;Therapeutic exercise;Patient/family education   PT Goals Acute Rehab PT Goals PT Goal Formulation: With patient/family Time For Goal Achievement: 05/19/12 Potential to Achieve Goals: Fair Pt will go Supine/Side to Sit: with supervision PT Goal: Supine/Side to Sit - Progress: Goal set today Pt will go Sit to Supine/Side: with supervision PT Goal: Sit to Supine/Side - Progress: Goal set today Pt will go Stand to Sit: with min assist PT Goal: Stand to Sit - Progress: Goal set today Pt will Transfer Bed to Chair/Chair to Bed: with min assist PT Transfer Goal: Bed to Chair/Chair to Bed - Progress: Goal set today Pt will Ambulate: 16 - 50 feet;with min assist;with rolling walker PT Goal: Ambulate - Progress: Goal set today Pt will Perform Home Exercise Program: with min assist PT Goal: Perform Home Exercise Program - Progress: Goal set today  Visit Information  Last PT Received On: 05/05/12 Assistance Needed: +2    Subjective Data  Subjective: I go  when I don't know. I can't walk Patient Stated Goal: agreed to getting OOB   Prior Functioning  Home Living Lives With: Alone Available Help at Discharge: Family Type of Home: House Home Access: Level entry Home Layout: Two level;Able to live on main level with bedroom/bathroom Home Adaptive Equipment: Walker - rolling Prior Function Level of Independence: Needs assistance Needs Assistance: Light Housekeeping;Bathing    Cognition  Overall Cognitive Status: Difficult to assess Difficult to assess due to: Hard of hearing/deaf Arousal/Alertness: Awake/alert Orientation Level: Appears intact for tasks assessed Behavior During Session: [facility] for tasks performed    Extremity/Trunk Assessment  Right Upper Extremity Assessment RUE ROM/Strength/Tone: [facility] for tasks assessed Left Upper Extremity Assessment LUE  ROM/Strength/Tone: [facility] for tasks assessed Right Lower Extremity Assessment RLE ROM/Strength/Tone: Deficits RLE ROM/Strength/Tone Deficits: at least 4/5 for transfer. Left Lower Extremity Assessment LLE ROM/Strength/Tone: Deficits LLE ROM/Strength/Tone Deficits: same as RLE   Balance Static Sitting Balance Static Sitting - Balance Support: Bilateral upper extremity supported Static Sitting - Level of Assistance: 5: Stand by assistance  End of Session PT - End of Session Activity Tolerance: Patient limited by fatigue Patient left: in chair;with call bell/phone within reach Nurse Communication: Mobility status  GP     Rada Hay 05/05/2012, 5:19 PM  [phone]

## 2012-05-06 LAB — URINE CULTURE

## 2012-05-06 LAB — BASIC METABOLIC PANEL
BUN: 20 mg/dL (ref 6–23)
CO2: 23 mEq/L (ref 19–32)
Calcium: 8.6 mg/dL (ref 8.4–10.5)
Creatinine, Ser: 0.63 mg/dL (ref 0.50–1.10)

## 2012-05-06 LAB — CBC
HCT: 31.5 % — ABNORMAL LOW (ref 36.0–46.0)
MCH: 27.2 pg (ref 26.0–34.0)
MCV: 84 fL (ref 78.0–100.0)
Platelets: 166 10*3/uL (ref 150–400)
RBC: 3.75 MIL/uL — ABNORMAL LOW (ref 3.87–5.11)
RDW: 15.2 % (ref 11.5–15.5)

## 2012-05-06 LAB — GLUCOSE, CAPILLARY
Glucose-Capillary: 164 mg/dL — ABNORMAL HIGH (ref 70–99)
Glucose-Capillary: 157 mg/dL — ABNORMAL HIGH (ref 70–99)

## 2012-05-06 MED ORDER — POTASSIUM CHLORIDE CRYS ER 20 MEQ PO TBCR
20.0000 meq | EXTENDED_RELEASE_TABLET | Freq: Two times a day (BID) | ORAL | Status: DC
  Administered 2012-05-06 – 2012-05-17 (×23): 20 meq via ORAL
  Filled 2012-05-06 (×24): qty 1

## 2012-05-06 NOTE — Progress Notes (Signed)
ANTIBIOTIC CONSULT NOTE  Pharmacy Consult for zosyn Indication: unknown source of leukocytosis  Allergies  Allergen Reactions  . Neosporin (Neomycin-Bacitracin Zn-Polymyx)   . Sulfa Antibiotics     Patient Measurements: Height: 5\' 4"  (162.6 cm) Weight: 149 lb 4 oz (67.7 kg) IBW/kg (Calculated) : 54.7  Adjusted Body Weight:   Vital Signs: Temp: 97.4 F (36.3 C) (01/09 0521) Temp src: Oral (01/09 0521) BP: 148/67 mmHg (01/09 0521) Pulse Rate: 85  (01/09 0521) Intake/Output from previous day: 01/08 0701 - 01/09 0700 In: 2496.3 [P.O.:480; I.V.:1846.3; IV Piggyback:170] Out: -  Intake/Output from this shift:    Labs:  Basename 05/06/12 0510 05/05/12 0507 05/04/12 0522  WBC 20.7* 18.0* 15.7*  HGB 10.2* 10.8* 9.2*  PLT 166 165 149*  LABCREA -- -- --  CREATININE 0.63 0.69 0.68   Estimated Creatinine Clearance: 45.1 ml/min (by C-G formula based on Cr of 0.63). No results found for this basename: VANCOTROUGH:2,VANCOPEAK:2,VANCORANDOM:2,GENTTROUGH:2,GENTPEAK:2,GENTRANDOM:2,TOBRATROUGH:2,TOBRAPEAK:2,TOBRARND:2,AMIKACINPEAK:2,AMIKACINTROU:2,AMIKACIN:2, in the last 72 hours   Microbiology: Recent Results (from the past 720 hour(s))  CULTURE, BLOOD (ROUTINE X 2)     Status: Normal   Collection Time   05/02/12 12:14 PM      Component Value Range Status Comment   Specimen Description BLOOD LEFT ARM   Final    Special Requests     Final    Value: BOTTLES DRAWN AEROBIC AND ANAEROBIC 10CC BOTH BOTTLES   Culture  Setup Time 05/02/2012 17:19   Final    Culture     Final    Value: CLOSTRIDIUM PERFRINGENS     Note: Gram Stain Report Called to,Read Back By and Verified With: Mignon Pine RN on 05/03/12 at 04:37 by Christie Nottingham   Report Status 05/04/2012 FINAL   Final   CULTURE, BLOOD (ROUTINE X 2)     Status: Normal   Collection Time   05/02/12 12:14 PM      Component Value Range Status Comment   Specimen Description BLOOD LEFT ARM   Final    Special Requests     Final    Value: BOTTLES  DRAWN AEROBIC AND ANAEROBIC 6CC BOTH BOTTLES   Culture  Setup Time 05/02/2012 17:20   Final    Culture     Final    Value: CLOSTRIDIUM PERFRINGENS     Note: Gram Stain Report Called to,Read Back By and Verified With: Mignon Pine RN on 05/03/12 at 04:37 by Christie Nottingham   Report Status 05/04/2012 FINAL   Final   URINE CULTURE     Status: Normal   Collection Time   05/02/12  2:03 PM      Component Value Range Status Comment   Specimen Description URINE, CATHETERIZED   Final    Special Requests none   Final    Culture  Setup Time 05/02/2012 17:24   Final    Colony Count >=100,000 COLONIES/ML   Final    Culture     Final    Value: ESCHERICHIA COLI     ENTEROCOCCUS SPECIES   Report Status 05/06/2012 FINAL   Final    Organism ID, Bacteria ESCHERICHIA COLI   Final    Organism ID, Bacteria ENTEROCOCCUS SPECIES   Final   CULTURE, BLOOD (ROUTINE X 2)     Status: Normal (Preliminary result)   Collection Time   05/05/12  6:00 PM      Component Value Range Status Comment   Specimen Description BLOOD RIGHT HAND   Final    Special Requests BOTTLES DRAWN AEROBIC  AND ANAEROBIC 10CC   Final    Culture  Setup Time 05/05/2012 22:35   Final    Culture     Final    Value:        BLOOD CULTURE RECEIVED NO GROWTH TO DATE CULTURE WILL BE HELD FOR 5 DAYS BEFORE ISSUING A FINAL NEGATIVE REPORT   Report Status PENDING   Incomplete   CULTURE, BLOOD (ROUTINE X 2)     Status: Normal (Preliminary result)   Collection Time   05/05/12  6:30 PM      Component Value Range Status Comment   Specimen Description BLOOD RIGHT ARM   Final    Special Requests BOTTLES DRAWN AEROBIC AND ANAEROBIC 10CC   Final    Culture  Setup Time 05/05/2012 22:35   Final    Culture     Final    Value:        BLOOD CULTURE RECEIVED NO GROWTH TO DATE CULTURE WILL BE HELD FOR 5 DAYS BEFORE ISSUING A FINAL NEGATIVE REPORT   Report Status PENDING   Incomplete     Medical History: Past Medical History  Diagnosis Date  . Hypertension   . Irregular  heart beat     Medications:  Scheduled:     . docusate sodium  100 mg Oral BID  . lip balm  1 application Topical BID  . metoprolol  2.5 mg Intravenous Q6H  . pantoprazole (PROTONIX) IV  40 mg Intravenous Q12H  . piperacillin-tazobactam (ZOSYN)  IV  3.375 g Intravenous Q8H  . [COMPLETED] potassium chloride  10 mEq Intravenous Q1 Hr x 3  . [COMPLETED] potassium chloride  40 mEq Oral Q4H  . sodium chloride  3 mL Intravenous Q12H  . [DISCONTINUED] potassium chloride  10 mEq Oral TID WC & HS   Assessment: 89 YOF admitted with SBO and abd pain, WBC trending up.  1/5 >> Zosyn >>  Tmax: afeb WBCs: 20.7 0 trending up Renal: 1.72 > 0.86 >0.63, CrCl 45  1/5 blood: 2 of 2 CLOSTRIDIUM PERFRINGES 1/5 urine: >100k E.coli (R bactrim only)  Dose changes/drug level info:  1/6: Change from 2.25g q6h to 3.375g q8h EI for improved CrCl  -seen by ID, clostridium in blood culture not likely significant, continue zosyn and monitor for acute abdominal symptoms  Goal of Therapy:  Renally adjust antibiotics  Plan:  - Continue zosyn 3.375gm IV q8h each dose over 4h infusion  Dannielle Huh 05/06/2012,8:57 AM

## 2012-05-06 NOTE — Progress Notes (Addendum)
[facility] PROGRESS NOTE  OTHELLA SLAPPEY ZOX:[id number] DOB: 04-11-1921 DOA: 04/30/2012 PCP: No primary provider on file.  Assessment/Plan: Principal Problem:  *SBO (small bowel obstruction) Active Problems:  Hyponatremia  HTN (hypertension)  Traumatic hematoma of foot  Leukocytosis  Hypokalemia    1. SBO (small bowel obstruction): Patient presented with about 24 hours of abdominal pain, and vomiting. Imaging studies demonstrated small bowel obstruction with transition zone in the anterior central pelvis, associated with bowel wall thickening at the point of obstruction. This could represent an adhesion, though subtle mass lesion is not excluded. There was increased stool throughout colon, and a moderate sized hiatal hernia. Dr Girtha Hake provided surgical consultation and has recommended bowel rest and enemas, NG-T and follow up abdominal films. Clinically, patient appears to be making progress, with decreased NG-T output, and decreased bowel caliber on X-Ray.Continue management per surgeon.  NG-T dc'ed today 1/9, +BM -diet advanced to full per surgery -resolving, and for now pt does not want surgical intervention 2. Hyponatremia: Sodium was 121 at presentation. This is likely secondary to dehydration, as well as pre-admission diuretic therapy. Diuretics are on hold. managing with iv NS, with improvement.  sodium has normalized at 137 on1/7.  -Follow recheck.  3. HTN (hypertension): Controlled on iv Lopressor with holding parameters and prn Hydralazine until tolerating po well.  4. Painful right foot hematoma: Patient has a hematoma on the dorsum of her right foot about 4 cm in diameter, following a recent fall. Fortunately, no bony injuries on plain films. Clinically, there is no evidence of infection at this time. This is gradually resolving.  5. Acute urinary retention: This occurred overnight on 05/02/12, and was addressed with Foley catheter. Trial of voiding.  6.  Leukocytosis: Patient had a high, persistent leukocytosis, although she is afebrile, CXR was devoid of infiltrates and urinalysis was negative. Commenced on empiric Zosyn on 05/02/12.  -Blood cultures grew GPR in identified as clostridium perfringens, appreciate ID assistance will, continue zosyn and follow repeat cultures  -Urine culture grew E. Coli.  -still increasing even with treatment as above, discussed with ID -- will check lactic acid level and follow 7. UTI, Ecoli/enterococcus: both sensitive to zosyn,See discussion in #6 above.  8.Clostridium perfringens Bacteremia - on zosyn, I have consulted ID 8. Sore throat/Dysphonia: Patient developed a sore throat and hoarseness, after reinsertion of displaced NG-T on 05/02/11. Throat appeared clear, without hyperemia or exudate. Managed successfuly with  with Chloraseptic spray. -improved today 1/9 s/p NGT removal  9. AKI: Had jump in creatinine from 0.93 to 1.72, overnight on 05/02/12. Responded to iv fluids, and creatinine has normalized at 0.86 as of 05/03/12- and remains wnl.     Code Status: DNR/DNI Family Communication: family at bedside Disposition Plan: To be determined.    Brief narrative: 79 y.o. Female with history of Hypertension, arrhythmia, previous cataract extraction, IBS and chronic constipation, presenting with abdominal pain, nausea and vomiting since 04/30/12, without o diarrhea. She had some URI symptoms all last week. She has never had a colonoscopy done in her life. Admitted for further evaluation and management.     Consultants:  Almond Lint, surgeon.   Procedures:  CXR.  Abdominal X-Ray.  X-Ray right foot.  CT Abdomen/pelvis.   Antibiotics:  N/A.   HPI/Subjective: Sitting up in chair, states she feels better s/p NG removal today Objective: Vital signs in last 24 hours: Temp:  [97.4 F (36.3 C)-98.3 F (36.8 C)] 98.3 F (36.8 C) (01/09 1455) Pulse Rate:  [  80-88] 85  (01/09 0521) Resp:  [19-20] 20  (01/09  1455) BP: (139-151)/(54-67) 148/67 mmHg (01/09 0521) SpO2:  [94 %-97 %] 95 % (01/09 1455) Weight change:  Last BM Date: 05/05/12  Intake/Output from previous day: 01/08 0701 - 01/09 0700 In: 2496.3 [P.O.:480; I.V.:1846.3; IV Piggyback:170] Out: -  Total I/O In: 80 [P.O.:80] Out: -    Physical Exam: General: Comfortable, alert, communicative, fully oriented, not short of breath at rest. NG-T removed.  HEENT:  No clinical pallor, no jaundice, no conjunctival injection or discharge. Hydration is fair.  NECK:  Supple, JVP not seen, no carotid bruits, no palpable lymphadenopathy, no palpable goiter. CHEST:  Clinically clear to auscultation, no wheezes, no crackles. HEART:  Sounds 1 and 2 heard, normal, regular, no murmurs. ABDOMEN:  Mildly distended, non-tender, no palpable organomegaly, no palpable masses, normal bowel sounds.. LOWER EXTREMITIES:  No  edema, palpable peripheral pulses. Patient has a 4 cm diameter cystic , mildly tender swelling with overlying bruising, on the dorsum of her right foot, consistent with hematoma. Not clinically infected. Now visibly smaller.  CENTRAL NERVOUS SYSTEM:  No focal neurologic deficit on gross examination.  Lab Results:  Basename 05/06/12 0510 05/05/12 0507  WBC 20.7* 18.0*  HGB 10.2* 10.8*  HCT 31.5* 31.7*  PLT 166 165    Basename 05/06/12 0510 05/05/12 0507  NA 143 142  K 3.7 2.8*  CL 110 107  CO2 23 20  GLUCOSE 149* 117*  BUN 20 25*  CREATININE 0.63 0.69  CALCIUM 8.6 8.9   Recent Results (from the past 240 hour(s))  CULTURE, BLOOD (ROUTINE X 2)     Status: Normal   Collection Time   05/02/12 12:14 PM      Component Value Range Status Comment   Specimen Description BLOOD LEFT ARM   Final    Special Requests     Final    Value: BOTTLES DRAWN AEROBIC AND ANAEROBIC 10CC BOTH BOTTLES   Culture  Setup Time 05/02/2012 17:19   Final    Culture     Final    Value: CLOSTRIDIUM PERFRINGENS     Note: Gram Stain Report Called to,Read  Back By and Verified With: Mignon Pine RN on 05/03/12 at 04:37 by Christie Nottingham   Report Status 05/04/2012 FINAL   Final   CULTURE, BLOOD (ROUTINE X 2)     Status: Normal   Collection Time   05/02/12 12:14 PM      Component Value Range Status Comment   Specimen Description BLOOD LEFT ARM   Final    Special Requests     Final    Value: BOTTLES DRAWN AEROBIC AND ANAEROBIC 6CC BOTH BOTTLES   Culture  Setup Time 05/02/2012 17:20   Final    Culture     Final    Value: CLOSTRIDIUM PERFRINGENS     Note: Gram Stain Report Called to,Read Back By and Verified With: Mignon Pine RN on 05/03/12 at 04:37 by Christie Nottingham   Report Status 05/04/2012 FINAL   Final   URINE CULTURE     Status: Normal   Collection Time   05/02/12  2:03 PM      Component Value Range Status Comment   Specimen Description URINE, CATHETERIZED   Final    Special Requests none   Final    Culture  Setup Time 05/02/2012 17:24   Final    Colony Count >=100,000 COLONIES/ML   Final    Culture     Final  Value: ESCHERICHIA COLI     ENTEROCOCCUS SPECIES   Report Status 05/06/2012 FINAL   Final    Organism ID, Bacteria ESCHERICHIA COLI   Final    Organism ID, Bacteria ENTEROCOCCUS SPECIES   Final   CULTURE, BLOOD (ROUTINE X 2)     Status: Normal (Preliminary result)   Collection Time   05/05/12  6:00 PM      Component Value Range Status Comment   Specimen Description BLOOD RIGHT HAND   Final    Special Requests BOTTLES DRAWN AEROBIC AND ANAEROBIC 10CC   Final    Culture  Setup Time 05/05/2012 22:35   Final    Culture     Final    Value:        BLOOD CULTURE RECEIVED NO GROWTH TO DATE CULTURE WILL BE HELD FOR 5 DAYS BEFORE ISSUING A FINAL NEGATIVE REPORT   Report Status PENDING   Incomplete   CULTURE, BLOOD (ROUTINE X 2)     Status: Normal (Preliminary result)   Collection Time   05/05/12  6:30 PM      Component Value Range Status Comment   Specimen Description BLOOD RIGHT ARM   Final    Special Requests BOTTLES DRAWN AEROBIC AND ANAEROBIC  10CC   Final    Culture  Setup Time 05/05/2012 22:35   Final    Culture     Final    Value:        BLOOD CULTURE RECEIVED NO GROWTH TO DATE CULTURE WILL BE HELD FOR 5 DAYS BEFORE ISSUING A FINAL NEGATIVE REPORT   Report Status PENDING   Incomplete      Studies/Results: No results found.  Medications: Scheduled Meds:    . docusate sodium  100 mg Oral BID  . lip balm  1 application Topical BID  . metoprolol  2.5 mg Intravenous Q6H  . pantoprazole (PROTONIX) IV  40 mg Intravenous Q12H  . piperacillin-tazobactam (ZOSYN)  IV  3.375 g Intravenous Q8H  . potassium chloride  20 mEq Oral BID  . sodium chloride  3 mL Intravenous Q12H   Continuous Infusions:    . sodium chloride 75 mL/hr (05/04/12 1724)   PRN Meds:.acetaminophen, acetaminophen, alum & mag hydroxide-simeth, bisacodyl, hydrALAZINE, magic mouthwash, menthol-cetylpyridinium, morphine injection, ondansetron (ZOFRAN) IV, ondansetron, phenol    LOS: 6 days   [facility]  [facility] Pager [phone]. If 8PM-8AM, please contact night-coverage at [email or web address], password [facility] 05/06/2012, 2:57 PM  LOS: 6 days

## 2012-05-06 NOTE — Progress Notes (Signed)
Physical Therapy Treatment Patient Details Name: [name] MRN: [id number] DOB: 05-18-1920 Today's Date: 05/06/2012 Time: 5284-1324 PT Time Calculation (min): 23 min  PT Assessment / Plan / Recommendation Comments on Treatment Session  Pt assisted to [facility] due to BM in bed and then able to take small steps over to recliner.  Pt fatigues quickly and requires rest breaks between transfers.  SaO2 98% room air upon sitting in recliner however pt reports SOB (RN notified).    Follow Up Recommendations  SNF;Supervision/Assistance - 24 hour     Does the patient have the potential to tolerate intense rehabilitation     Barriers to Discharge        Equipment Recommendations  None recommended by PT    Recommendations for Other Services    Frequency     Plan Discharge plan remains appropriate;Frequency remains appropriate    Precautions / Restrictions Precautions Precautions: Fall Precaution Comments: incontinent of B/B Restrictions Weight Bearing Restrictions: No   Pertinent Vitals/Pain No pain, SOB reported and SaO2 98% room air    Mobility  Bed Mobility Bed Mobility: Supine to Sit Supine to Sit: 3: Mod assist Details for Bed Mobility Assistance: verbal cues for technique, increased time to perform, assist for hip to EOB and some for trunk Transfers Transfers: Stand to Sit;Sit to Stand Sit to Stand: 3: Mod assist;4: Min assist;With upper extremity assist;From chair/3-in-1;From bed Stand to Sit: Without upper extremity assist;4: Min assist;3: Mod assist;To chair/3-in-1 Details for Transfer Assistance: assist varied depending on fatigue, assisted to [facility] then stood for hygiene but required seated rest break prior to small steps to recliner Ambulation/Gait Ambulation/Gait Assistance: 4: Min assist Ambulation Distance (Feet): 3 Feet Assistive device: Rolling walker Ambulation/Gait Assistance Details: pt able to take multiple small steps from [facility] to recliner with verbal cues for  RW and turning, increased time due to fatigue Gait Pattern: Step-to pattern;Trunk flexed Gait velocity: decreased    Exercises     PT Diagnosis:    PT Problem List:   PT Treatment Interventions:     PT Goals Acute Rehab PT Goals PT Goal: Supine/Side to Sit - Progress: Progressing toward goal PT Goal: Sit to Stand - Progress: Progressing toward goal PT Goal: Stand to Sit - Progress: Progressing toward goal PT Goal: Ambulate - Progress: Progressing toward goal  Visit Information  Last PT Received On: 05/06/12 Assistance Needed: +2    Subjective Data  Subjective: Is it walking if I go from here to the chair?  (BSC to recliner)   Cognition  Overall Cognitive Status: Appears within functional limits for tasks assessed/performed Arousal/Alertness: Awake/alert Orientation Level: Appears intact for tasks assessed Behavior During Session: [facility] for tasks performed    Balance  Balance Balance Assessed: Yes Static Standing Balance Static Standing - Balance Support: Bilateral upper extremity supported Static Standing - Level of Assistance: 4: Min assist Static Standing - Comment/# of Minutes: pt min/guard once standing with RW while tech performed hygiene however unable to tolerate more than 2 minutes standing  End of Session PT - End of Session Equipment Utilized During Treatment: Gait belt Activity Tolerance: Patient limited by fatigue Patient left: in chair;with call bell/phone within reach;with family/visitor present Nurse Communication: Other (comment) (pt in chair)   GP     Kanishk Stroebel,KATHrine E 05/06/2012, 11:01 AM Pager: [phone]

## 2012-05-06 NOTE — Progress Notes (Signed)
PCR for Cdiff is Negative. Contact Precautions discontinued per protocol.

## 2012-05-06 NOTE — Progress Notes (Signed)
  Subjective: Feels better, complains she can't cough, no nausea, +BM's, she is also hard of hearing.  Son is in the room with her. She apparently has problems with constipation? IBS per her son, at home.  Probably takes laxitives at home if she doesn't have a BM daily.  Objective: Vital signs in last 24 hours: Temp:  [97.4 F (36.3 C)-97.7 F (36.5 C)] 97.4 F (36.3 C) (01/09 0521) Pulse Rate:  [78-103] 85  (01/09 0521) Resp:  [18-19] 19  (01/09 0521) BP: (139-151)/(54-72) 148/67 mmHg (01/09 0521) SpO2:  [94 %-97 %] 94 % (01/09 0521) Last BM Date: 05/05/12  PO 480 ml, 5 stools recorded, afebrile, Vss, labs OK this AM, WBC continues to rise.  Intake/Output from previous day: 01/08 0701 - 01/09 0700 In: 2496.3 [P.O.:480; I.V.:1846.3; IV Piggyback:170] Out: -  Intake/Output this shift:    General appearance: alert, cooperative and no distress GI: soft, non-tender; bowel sounds normal; no masses,  no organomegaly  Lab Results:   [facility] 05/06/12 0510 05/05/12 0507  WBC 20.7* 18.0*  HGB 10.2* 10.8*  HCT 31.5* 31.7*  PLT 166 165    BMET  Basename 05/06/12 0510 05/05/12 0507  NA 143 142  K 3.7 2.8*  CL 110 107  CO2 23 20  GLUCOSE 149* 117*  BUN 20 25*  CREATININE 0.63 0.69  CALCIUM 8.6 8.9   PT/INR No results found for this basename: LABPROT:2,INR:2 in the last 72 hours   Lab 05/03/12 0446 05/01/12 0905 05/01/12 0015  AST 38* 29 28  ALT 13 15 14   ALKPHOS 70 70 86  BILITOT 0.8 0.9 0.7  PROT 5.8* 7.0 8.4*  ALBUMIN 2.6* 3.5 4.3     Lipase     Component Value Date/Time   LIPASE 32 05/01/2012 0015     Studies/Results: No results found.  Medications:    . docusate sodium  100 mg Oral BID  . lip balm  1 application Topical BID  . metoprolol  2.5 mg Intravenous Q6H  . pantoprazole (PROTONIX) IV  40 mg Intravenous Q12H  . piperacillin-tazobactam (ZOSYN)  IV  3.375 g Intravenous Q8H  . sodium chloride  3 mL Intravenous Q12H    Assessment/Plan SBO/  No surgical history  Hypertension  Hyponatremia  Right foot hematoma  Urinary retention/UTI (E Coli) Hypokalemia K+2.8 yesterday up to 3.7 today +Blood cultures    Plan:  i would continue to replace her K+, I have removed her NG and will advacne her to a full liquid diet.  Mobilizing her would be helpful, and if she needs it regular fiber for constipation.   LOS: 6 days    JENNINGS,WILLARD 05/06/2012  Pt seen and examined.  Tolerating liquids.  NG out.  Her abdomen exam is unchanged.  She seems to be doing okay and continues to reiterate that she does not want any surgery.  Wbc elevated?? Workup by ID and IM

## 2012-05-06 NOTE — Progress Notes (Signed)
[facility]  Date of Admission:  04/30/2012  Antibiotics: Zosyn day 5  Subjective: No significant complaints, no f/c  Objective: Temp:  [97.4 F (36.3 C)-97.7 F (36.5 C)] 97.4 F (36.3 C) (01/09 0521) Pulse Rate:  [78-88] 85  (01/09 0521) Resp:  [18-19] 19  (01/09 0521) BP: (139-151)/(54-67) 148/67 mmHg (01/09 0521) SpO2:  [94 %-97 %] 94 % (01/09 0521)  General: Awake, alert, nad Skin: no rashes Lungs: CTA B Cor: RRR Abdomen: soft, distended, nt, +bs   Lab Results Lab Results  Component Value Date   WBC 20.7* 05/06/2012   HGB 10.2* 05/06/2012   HCT 31.5* 05/06/2012   MCV 84.0 05/06/2012   PLT 166 05/06/2012    Lab Results  Component Value Date   CREATININE 0.63 05/06/2012   BUN 20 05/06/2012   NA 143 05/06/2012   K 3.7 05/06/2012   CL 110 05/06/2012   CO2 23 05/06/2012    Lab Results  Component Value Date   ALT 13 05/03/2012   AST 38* 05/03/2012   ALKPHOS 70 05/03/2012   BILITOT 0.8 05/03/2012      Microbiology: Recent Results (from the past 240 hour(s))  CULTURE, BLOOD (ROUTINE X 2)     Status: Normal   Collection Time   05/02/12 12:14 PM      Component Value Range Status Comment   Specimen Description BLOOD LEFT ARM   Final    Special Requests     Final    Value: BOTTLES DRAWN AEROBIC AND ANAEROBIC 10CC BOTH BOTTLES   Culture  Setup Time 05/02/2012 17:19   Final    Culture     Final    Value: CLOSTRIDIUM PERFRINGENS     Note: Gram Stain Report Called to,Read Back By and Verified With: Mignon Pine RN on 05/03/12 at 04:37 by Christie Nottingham   Report Status 05/04/2012 FINAL   Final   CULTURE, BLOOD (ROUTINE X 2)     Status: Normal   Collection Time   05/02/12 12:14 PM      Component Value Range Status Comment   Specimen Description BLOOD LEFT ARM   Final    Special Requests     Final    Value: BOTTLES DRAWN AEROBIC AND ANAEROBIC 6CC BOTH BOTTLES   Culture  Setup Time 05/02/2012 17:20   Final    Culture     Final    Value: CLOSTRIDIUM PERFRINGENS   Note: Gram Stain Report Called to,Read Back By and Verified With: Mignon Pine RN on 05/03/12 at 04:37 by Christie Nottingham   Report Status 05/04/2012 FINAL   Final   URINE CULTURE     Status: Normal   Collection Time   05/02/12  2:03 PM      Component Value Range Status Comment   Specimen Description URINE, CATHETERIZED   Final    Special Requests none   Final    Culture  Setup Time 05/02/2012 17:24   Final    Colony Count >=100,000 COLONIES/ML   Final    Culture     Final    Value: ESCHERICHIA COLI     ENTEROCOCCUS SPECIES   Report Status 05/06/2012 FINAL   Final    Organism ID, Bacteria ESCHERICHIA COLI   Final    Organism ID, Bacteria ENTEROCOCCUS SPECIES   Final   CULTURE, BLOOD (ROUTINE X 2)     Status: Normal (Preliminary result)   Collection Time   05/05/12  6:00 PM  Component Value Range Status Comment   Specimen Description BLOOD RIGHT HAND   Final    Special Requests BOTTLES DRAWN AEROBIC AND ANAEROBIC 10CC   Final    Culture  Setup Time 05/05/2012 22:35   Final    Culture     Final    Value:        BLOOD CULTURE RECEIVED NO GROWTH TO DATE CULTURE WILL BE HELD FOR 5 DAYS BEFORE ISSUING A FINAL NEGATIVE REPORT   Report Status PENDING   Incomplete   CULTURE, BLOOD (ROUTINE X 2)     Status: Normal (Preliminary result)   Collection Time   05/05/12  6:30 PM      Component Value Range Status Comment   Specimen Description BLOOD RIGHT ARM   Final    Special Requests BOTTLES DRAWN AEROBIC AND ANAEROBIC 10CC   Final    Culture  Setup Time 05/05/2012 22:35   Final    Culture     Final    Value:        BLOOD CULTURE RECEIVED NO GROWTH TO DATE CULTURE WILL BE HELD FOR 5 DAYS BEFORE ISSUING A FINAL NEGATIVE REPORT   Report Status PENDING   Incomplete     Studies/Results: No results found.  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  Still no signs of bowel necrosis since she continues to improve.  Blood cultures repeated.   -continue with Zosyn  for now pending repeat blood cultures. -consider checking lactate to see if there is any concern for bowel necrosis.    2) leukocytosis - still elevated.  No obvious source of infection.  May be reactive/leukemoid.  I will check C diff.  Staci Righter, MD [facility] [phone] pager   05/06/2012, 1:30 PM

## 2012-05-06 NOTE — Clinical Social Work Psychosocial (Unsigned)
     Clinical Social Work Department BRIEF PSYCHOSOCIAL ASSESSMENT 05/06/2012  Patient:  [name], [name]     Account Number:  [id number]     Admit date:  04/30/2012  Clinical Social Worker:  Hattie Perch  Date/Time:  05/06/2012 12:00 M  Referred by:  Physician  Date Referred:  05/06/2012 Referred for  SNF Placement   Other Referral:   Interview type:  Patient Other interview type:   son at bedside    PSYCHOSOCIAL DATA Living Status:  ALONE Admitted from facility:   Level of care:   Primary support name:  Delice Lesch Primary support relationship to patient:  CHILD, ADULT Degree of support available:   good    CURRENT CONCERNS Current Concerns  Post-Acute Placement   Other Concerns:    SOCIAL WORK ASSESSMENT / PLAN CSW met with patient and son at bedisde. patient is alert and oriented X3. patient in need of snf placement. patient agreeable to being faxed out and recieving bed offers.   Assessment/plan status:   Other assessment/ plan:   Information/referral to community resources:    PATIENTS/FAMILYS RESPONSE TO PLAN OF CARE: patient agreeable to recieving bed offers and need for snf.

## 2012-05-07 LAB — CBC
HCT: 31.1 % — ABNORMAL LOW (ref 36.0–46.0)
Hemoglobin: 9.8 g/dL — ABNORMAL LOW (ref 12.0–15.0)
MCH: 26.8 pg (ref 26.0–34.0)
MCHC: 31.5 g/dL (ref 30.0–36.0)
MCV: 85 fL (ref 78.0–100.0)
Platelets: 171 10*3/uL (ref 150–400)
RBC: 3.66 MIL/uL — ABNORMAL LOW (ref 3.87–5.11)
RDW: 15.5 % (ref 11.5–15.5)
WBC: 21.6 10*3/uL — ABNORMAL HIGH (ref 4.0–10.5)

## 2012-05-07 LAB — BASIC METABOLIC PANEL WITH GFR
BUN: 25 mg/dL — ABNORMAL HIGH (ref 6–23)
CO2: 21 meq/L (ref 19–32)
Calcium: 8.5 mg/dL (ref 8.4–10.5)
Chloride: 114 meq/L — ABNORMAL HIGH (ref 96–112)
Creatinine, Ser: 0.75 mg/dL (ref 0.50–1.10)
GFR calc Af Amer: 84 mL/min — ABNORMAL LOW
GFR calc non Af Amer: 73 mL/min — ABNORMAL LOW
Glucose, Bld: 134 mg/dL — ABNORMAL HIGH (ref 70–99)
Potassium: 4 meq/L (ref 3.5–5.1)
Sodium: 144 meq/L (ref 135–145)

## 2012-05-07 LAB — GLUCOSE, CAPILLARY
Glucose-Capillary: 120 mg/dL — ABNORMAL HIGH (ref 70–99)
Glucose-Capillary: 160 mg/dL — ABNORMAL HIGH (ref 70–99)

## 2012-05-07 MED ORDER — PANTOPRAZOLE SODIUM 40 MG PO TBEC
40.0000 mg | DELAYED_RELEASE_TABLET | Freq: Two times a day (BID) | ORAL | Status: DC
  Administered 2012-05-07 – 2012-05-17 (×21): 40 mg via ORAL
  Filled 2012-05-07 (×23): qty 1

## 2012-05-07 NOTE — Progress Notes (Signed)
CSW provided patient with bed offers.  Alfred Harrel C. Jolisa Intriago MSW, LCSW [phone]

## 2012-05-07 NOTE — Progress Notes (Signed)
[facility]  Date of Admission:  04/30/2012  Antibiotics: Zosyn day 6  Subjective: No significant complaints, no f/c  Objective: Temp:  [99.1 F (37.3 C)-99.5 F (37.5 C)] 99.1 F (37.3 C) (01/10 0550) Pulse Rate:  [77-81] 77  (01/10 0550) Resp:  [24] 24  (01/10 0550) BP: (136-140)/(53-57) 140/57 mmHg (01/10 0550) SpO2:  [95 %] 95 % (01/10 0550)  General: Awake, alert, nad Skin: no rashes Lungs: CTA B Cor: RRR Abdomen: soft, distended, nt, +bs   Lab Results Lab Results  Component Value Date   WBC 21.6* 05/07/2012   HGB 9.8* 05/07/2012   HCT 31.1* 05/07/2012   MCV 85.0 05/07/2012   PLT 171 05/07/2012    Lab Results  Component Value Date   CREATININE 0.75 05/07/2012   BUN 25* 05/07/2012   NA 144 05/07/2012   K 4.0 05/07/2012   CL 114* 05/07/2012   CO2 21 05/07/2012    Lab Results  Component Value Date   ALT 13 05/03/2012   AST 38* 05/03/2012   ALKPHOS 70 05/03/2012   BILITOT 0.8 05/03/2012      Microbiology: Recent Results (from the past 240 hour(s))  CULTURE, BLOOD (ROUTINE X 2)     Status: Normal   Collection Time   05/02/12 12:14 PM      Component Value Range Status Comment   Specimen Description BLOOD LEFT ARM   Final    Special Requests     Final    Value: BOTTLES DRAWN AEROBIC AND ANAEROBIC 10CC BOTH BOTTLES   Culture  Setup Time 05/02/2012 17:19   Final    Culture     Final    Value: CLOSTRIDIUM PERFRINGENS     Note: Gram Stain Report Called to,Read Back By and Verified With: Mignon Pine RN on 05/03/12 at 04:37 by Christie Nottingham   Report Status 05/04/2012 FINAL   Final   CULTURE, BLOOD (ROUTINE X 2)     Status: Normal   Collection Time   05/02/12 12:14 PM      Component Value Range Status Comment   Specimen Description BLOOD LEFT ARM   Final    Special Requests     Final    Value: BOTTLES DRAWN AEROBIC AND ANAEROBIC 6CC BOTH BOTTLES   Culture  Setup Time 05/02/2012 17:20   Final    Culture     Final    Value: CLOSTRIDIUM PERFRINGENS    Note: Gram Stain Report Called to,Read Back By and Verified With: Mignon Pine RN on 05/03/12 at 04:37 by Christie Nottingham   Report Status 05/04/2012 FINAL   Final   URINE CULTURE     Status: Normal   Collection Time   05/02/12  2:03 PM      Component Value Range Status Comment   Specimen Description URINE, CATHETERIZED   Final    Special Requests none   Final    Culture  Setup Time 05/02/2012 17:24   Final    Colony Count >=100,000 COLONIES/ML   Final    Culture     Final    Value: ESCHERICHIA COLI     ENTEROCOCCUS SPECIES   Report Status 05/06/2012 FINAL   Final    Organism ID, Bacteria ESCHERICHIA COLI   Final    Organism ID, Bacteria ENTEROCOCCUS SPECIES   Final   CULTURE, BLOOD (ROUTINE X 2)     Status: Normal (Preliminary result)   Collection Time   05/05/12  6:00 PM  Component Value Range Status Comment   Specimen Description BLOOD RIGHT HAND   Final    Special Requests BOTTLES DRAWN AEROBIC AND ANAEROBIC 10CC   Final    Culture  Setup Time 05/05/2012 22:35   Final    Culture     Final    Value:        BLOOD CULTURE RECEIVED NO GROWTH TO DATE CULTURE WILL BE HELD FOR 5 DAYS BEFORE ISSUING A FINAL NEGATIVE REPORT   Report Status PENDING   Incomplete   CULTURE, BLOOD (ROUTINE X 2)     Status: Normal (Preliminary result)   Collection Time   05/05/12  6:30 PM      Component Value Range Status Comment   Specimen Description BLOOD RIGHT ARM   Final    Special Requests BOTTLES DRAWN AEROBIC AND ANAEROBIC 10CC   Final    Culture  Setup Time 05/05/2012 22:35   Final    Culture     Final    Value:        BLOOD CULTURE RECEIVED NO GROWTH TO DATE CULTURE WILL BE HELD FOR 5 DAYS BEFORE ISSUING A FINAL NEGATIVE REPORT   Report Status PENDING   Incomplete   CLOSTRIDIUM DIFFICILE BY PCR     Status: Normal   Collection Time   05/06/12  2:53 PM      Component Value Range Status Comment   C difficile by pcr NEGATIVE  NEGATIVE Final     Studies/Results: No results found.  Assessment/Plan: 1) C  perfringens - isolated bacteremia not known to be pathogenic.  Still no signs of bowel necrosis since she continues to improve.  Blood cultures repeated and are negative.  Her lactate is a bit elevated, but is tolerating po intake well so no concerning signs clinically.  -I am going to d/c zosyn now with repeat negative cultures and no fever or chills.    2) leukocytosis - still elevated.  No obvious source of infection.  May be reactive/leukemoid.  C diff is negative and overall her WBC is much improved compared to admission.  -as above, I am stopping antibiotics and observing.    If she clinically deteriorates, restart Zosyn and consider repeat CT scan of abdomen, otherwise, if she continues to do well, no further antibiotics indicated.   Dr. Ninetta Lights available over the weekend if needed, otherwise I willfollow up on Monday.    Staci Righter, MD [facility] [phone] pager   05/07/2012, 3:07 PM

## 2012-05-07 NOTE — Progress Notes (Signed)
[facility] PROGRESS NOTE  [name]:[id number] DOB: Sep 16, 1920 DOA: 04/30/2012 PCP: No primary provider on file.  Assessment/Plan: Principal Problem:  *SBO (small bowel obstruction) Active Problems:  Hyponatremia  HTN (hypertension)  Traumatic hematoma of foot  Leukocytosis  Hypokalemia    1. SBO (small bowel obstruction): Patient presented with about 24 hours of abdominal pain, and vomiting. Imaging studies demonstrated small bowel obstruction with transition zone in the anterior central pelvis, associated with bowel wall thickening at the point of obstruction. This could represent an adhesion, though subtle mass lesion is not excluded. There was increased stool throughout colon, and a moderate sized hiatal hernia. Dr Girtha Hake provided surgical consultation and has recommended bowel rest and enemas, NG-T and follow up abdominal films. Clinically, patient appears to be making progress, with decreased NG-T output, and decreased bowel caliber on X-Ray.Continue management per surgeon.  NG-T dc'ed today 1/9, +BM -diet advanced to full per surgery -resolving, and for now pt does not want surgical intervention 2. Hyponatremia: Sodium was 121 at presentation. This is likely secondary to dehydration, as well as pre-admission diuretic therapy. Diuretics are on hold. managing with iv NS, with improvement.  sodium has normalized at 137 on1/7.  -Follow recheck.  3. HTN (hypertension): Controlled on iv Lopressor with holding parameters and prn Hydralazine until tolerating po well.  4. Painful right foot hematoma: Patient has a hematoma on the dorsum of her right foot about 4 cm in diameter, following a recent fall. Fortunately, no bony injuries on plain films. Clinically, there is no evidence of infection at this time. This is gradually resolving.  5. Acute urinary retention: This occurred overnight on 05/02/12, and was addressed with Foley catheter. Trial of voiding.  6.  Leukocytosis: Patient had a high, persistent leukocytosis, although she is afebrile, CXR was devoid of infiltrates and urinalysis was negative. Commenced on empiric Zosyn on 05/02/12.  -Blood cultures grew GPR in identified as clostridium perfringens, appreciate ID assistance will, continue zosyn and follow repeat cultures  -Urine culture grew E. Coli.  -still increasing even with treatment as above, discussed with ID -lactic acid level elevated- with this there is concern for the possibility of necrotic bowel although pt has no symptoms c/w this at this time, her sbo is resolving. I discussed this with pt and son and she voices understanding and states that at this age she does not want to have any surgery done. Also discussed pt with Dr Biagio Quint who states since pt does not want surgery will continue conservative management 7. UTI, Ecoli/enterococcus: both sensitive to zosyn, abx dc'ed per ID today 1/10  8.Clostridium perfringens Bacteremia - appreciate ID assistance, repeat cultures with no growth>>abx dc'ed per ID, follow. 8. Sore throat/Dysphonia: Patient developed a sore throat and hoarseness, after reinsertion of displaced NG-T on 05/02/11. Throat appeared clear, without hyperemia or exudate. Managed successfuly with  with Chloraseptic spray. -improved today 1/9 s/p NGT removal  9. AKI: Had jump in creatinine from 0.93 to 1.72, overnight on 05/02/12. Responded to iv fluids, and creatinine has normalized at 0.86 as of 05/03/12- and remains wnl.     Code Status: DNR/DNI Family Communication: family at bedside Disposition Plan: To be determined.    Brief narrative: 79 y.o. Female with history of Hypertension, arrhythmia, previous cataract extraction, IBS and chronic constipation, presenting with abdominal pain, nausea and vomiting since 04/30/12, without o diarrhea. She had some URI symptoms all last week. She has never had a colonoscopy done in her life. Admitted for  further evaluation and management.      Consultants:  Almond Lint, surgeon.   Procedures:  CXR.  Abdominal X-Ray.  X-Ray right foot.  CT Abdomen/pelvis.   Antibiotics:  N/A.   HPI/Subjective: Eating breakfast, denies any new complaints. +BM Objective: Vital signs in last 24 hours: Temp:  [98.7 F (37.1 C)-99.5 F (37.5 C)] 98.7 F (37.1 C) (01/10 1509) Pulse Rate:  [58-81] 58  (01/10 1509) Resp:  [18-24] 18  (01/10 1509) BP: (116-140)/(40-57) 116/40 mmHg (01/10 1509) SpO2:  [90 %-96 %] 90 % (01/10 1509) Weight change:  Last BM Date: 05/05/12  Intake/Output from previous day: 01/09 0701 - 01/10 0700 In: 980 [P.O.:80; I.V.:900] Out: 2 [Urine:1; Stool:1] Total I/O In: 240 [P.O.:240] Out: -    Physical Exam: General: Comfortable, alert, communicative, fully oriented, not short of breath at rest. NG-T removed.  HEENT:  No clinical pallor, no jaundice, no conjunctival injection or discharge. Hydration is fair.  NECK:  Supple, JVP not seen, no carotid bruits, no palpable lymphadenopathy, no palpable goiter. CHEST:  Clinically clear to auscultation, no wheezes, no crackles. HEART:  Sounds 1 and 2 heard, normal, regular, no murmurs. ABDOMEN:  Mildly distended, non-tender, no palpable organomegaly, no palpable masses, normal bowel sounds.. LOWER EXTREMITIES:  No  edema, palpable peripheral pulses. Patient has a 4 cm diameter cystic , mildly tender swelling with overlying bruising, on the dorsum of her right foot, consistent with hematoma. Not clinically infected. Now visibly smaller.  CENTRAL NERVOUS SYSTEM:  No focal neurologic deficit on gross examination.  Lab Results:  [facility] 05/07/12 0528 05/06/12 0510  WBC 21.6* 20.7*  HGB 9.8* 10.2*  HCT 31.1* 31.5*  PLT 171 166    Basename 05/07/12 0528 05/06/12 0510  NA 144 143  K 4.0 3.7  CL 114* 110  CO2 21 23  GLUCOSE 134* 149*  BUN 25* 20  CREATININE 0.75 0.63  CALCIUM 8.5 8.6   Recent Results (from the past 240 hour(s))  CULTURE, BLOOD  (ROUTINE X 2)     Status: Normal   Collection Time   05/02/12 12:14 PM      Component Value Range Status Comment   Specimen Description BLOOD LEFT ARM   Final    Special Requests     Final    Value: BOTTLES DRAWN AEROBIC AND ANAEROBIC 10CC BOTH BOTTLES   Culture  Setup Time 05/02/2012 17:19   Final    Culture     Final    Value: CLOSTRIDIUM PERFRINGENS     Note: Gram Stain Report Called to,Read Back By and Verified With: Mignon Pine RN on 05/03/12 at 04:37 by Christie Nottingham   Report Status 05/04/2012 FINAL   Final   CULTURE, BLOOD (ROUTINE X 2)     Status: Normal   Collection Time   05/02/12 12:14 PM      Component Value Range Status Comment   Specimen Description BLOOD LEFT ARM   Final    Special Requests     Final    Value: BOTTLES DRAWN AEROBIC AND ANAEROBIC 6CC BOTH BOTTLES   Culture  Setup Time 05/02/2012 17:20   Final    Culture     Final    Value: CLOSTRIDIUM PERFRINGENS     Note: Gram Stain Report Called to,Read Back By and Verified With: Mignon Pine RN on 05/03/12 at 04:37 by Christie Nottingham   Report Status 05/04/2012 FINAL   Final   URINE CULTURE     Status: Normal   Collection  Time   05/02/12  2:03 PM      Component Value Range Status Comment   Specimen Description URINE, CATHETERIZED   Final    Special Requests none   Final    Culture  Setup Time 05/02/2012 17:24   Final    Colony Count >=100,000 COLONIES/ML   Final    Culture     Final    Value: ESCHERICHIA COLI     ENTEROCOCCUS SPECIES   Report Status 05/06/2012 FINAL   Final    Organism ID, Bacteria ESCHERICHIA COLI   Final    Organism ID, Bacteria ENTEROCOCCUS SPECIES   Final   CULTURE, BLOOD (ROUTINE X 2)     Status: Normal (Preliminary result)   Collection Time   05/05/12  6:00 PM      Component Value Range Status Comment   Specimen Description BLOOD RIGHT HAND   Final    Special Requests BOTTLES DRAWN AEROBIC AND ANAEROBIC 10CC   Final    Culture  Setup Time 05/05/2012 22:35   Final    Culture     Final    Value:         BLOOD CULTURE RECEIVED NO GROWTH TO DATE CULTURE WILL BE HELD FOR 5 DAYS BEFORE ISSUING A FINAL NEGATIVE REPORT   Report Status PENDING   Incomplete   CULTURE, BLOOD (ROUTINE X 2)     Status: Normal (Preliminary result)   Collection Time   05/05/12  6:30 PM      Component Value Range Status Comment   Specimen Description BLOOD RIGHT ARM   Final    Special Requests BOTTLES DRAWN AEROBIC AND ANAEROBIC 10CC   Final    Culture  Setup Time 05/05/2012 22:35   Final    Culture     Final    Value:        BLOOD CULTURE RECEIVED NO GROWTH TO DATE CULTURE WILL BE HELD FOR 5 DAYS BEFORE ISSUING A FINAL NEGATIVE REPORT   Report Status PENDING   Incomplete   CLOSTRIDIUM DIFFICILE BY PCR     Status: Normal   Collection Time   05/06/12  2:53 PM      Component Value Range Status Comment   C difficile by pcr NEGATIVE  NEGATIVE Final      Studies/Results: No results found.  Medications: Scheduled Meds:    . docusate sodium  100 mg Oral BID  . lip balm  1 application Topical BID  . metoprolol  2.5 mg Intravenous Q6H  . pantoprazole  40 mg Oral BID  . piperacillin-tazobactam (ZOSYN)  IV  3.375 g Intravenous Q8H  . potassium chloride  20 mEq Oral BID  . sodium chloride  3 mL Intravenous Q12H   Continuous Infusions:    . sodium chloride 75 mL/hr at 05/06/12 1634   PRN Meds:.acetaminophen, acetaminophen, alum & mag hydroxide-simeth, bisacodyl, hydrALAZINE, magic mouthwash, menthol-cetylpyridinium, morphine injection, ondansetron (ZOFRAN) IV, ondansetron, phenol    LOS: 7 days   [facility]  [facility] Pager [phone]. If 8PM-8AM, please contact night-coverage at [email or web address], password [email or web address] 05/07/2012, 3:32 PM  LOS: 7 days

## 2012-05-07 NOTE — Progress Notes (Signed)
Physical Therapy Treatment Patient Details Name: [name] MRN: [id number] DOB: 1920/11/22 Today's Date: 05/07/2012 Time: 5621-3086 PT Time Calculation (min): 31 min  PT Assessment / Plan / Recommendation Comments on Treatment Session  Pt assisted to recliner and able to perform a couple exercises.  Pt required short rest breaks between activity due to SOB and cued to perform pursed lip breathing.    Follow Up Recommendations  SNF;Supervision/Assistance - 24 hour     Does the patient have the potential to tolerate intense rehabilitation     Barriers to Discharge        Equipment Recommendations  None recommended by PT    Recommendations for Other Services    Frequency     Plan Discharge plan remains appropriate;Frequency remains appropriate    Precautions / Restrictions Precautions Precautions: Fall Precaution Comments: incontinent of B/B  Restrictions Weight Bearing Restrictions: No   Pertinent Vitals/Pain No pain    Mobility  Bed Mobility Bed Mobility: Supine to Sit Supine to Sit: 4: Min assist Details for Bed Mobility Assistance: verbal cues for technique, assist for trunk, increased time Transfers Transfers: Sit to Stand;Stand to Sit;Stand Pivot Transfers Sit to Stand: 4: Min assist;With upper extremity assist;From bed Stand to Sit: 4: Min assist;With upper extremity assist;To chair/3-in-1 Stand Pivot Transfers: 4: Min assist Details for Transfer Assistance: verbal cue for safe technique, pt fatigues quickly, assisted with hygiene upon standing due to BM and pt assisted to recliner, pt reported too fatigued to ambulate today    Exercises General Exercises - Lower Extremity Ankle Circles/Pumps: Both;15 reps;Supine;AROM Quad Sets: AROM;Both;10 reps;Supine Hip ABduction/ADduction: AROM;Both;15 reps;Supine (pillow squeezes)   PT Diagnosis:    PT Problem List:   PT Treatment Interventions:     PT Goals Acute Rehab PT Goals PT Goal: Supine/Side to Sit -  Progress: Progressing toward goal PT Goal: Sit to Stand - Progress: Progressing toward goal Pt will go Stand to Sit: with supervision PT Goal: Stand to Sit - Progress: Updated due to goals met Pt will Transfer Bed to Chair/Chair to Bed: with supervision PT Transfer Goal: Bed to Chair/Chair to Bed - Progress: Updated due to goal met PT Goal: Perform Home Exercise Program - Progress: Progressing toward goal  Visit Information  Last PT Received On: 05/07/12 Assistance Needed: +2    Subjective Data  Subjective: I feel weak today.   Cognition  Overall Cognitive Status: Appears within functional limits for tasks assessed/performed Arousal/Alertness: Awake/alert Orientation Level: Appears intact for tasks assessed Behavior During Session: [facility] for tasks performed    Balance     End of Session PT - End of Session Equipment Utilized During Treatment: Gait belt Activity Tolerance: Patient limited by fatigue Patient left: in chair;with call bell/phone within reach;with family/visitor present;with nursing in room   GP     Andranik Jeune,KATHrine E 05/07/2012, 12:15 PM Pager: [phone]

## 2012-05-07 NOTE — Progress Notes (Signed)
This patient is receiving IV Protonix. Based on criteria approved by the Pharmacy and Therapeutics Committee, this medication is being converted to the equivalent oral dose form. These criteria include:   . The patient is eating (either orally or per tube) and/or has been taking other orally administered medications for at least 24 hours.  . This patient has no evidence of active gastrointestinal bleeding or impaired GI absorption (gastrectomy, short bowel, patient on TNA or NPO).   If you have questions about this conversion, please contact the pharmacy department.  Dannielle Huh, [facility] 05/07/2012 8:58 AM

## 2012-05-07 NOTE — Progress Notes (Signed)
  Subjective: Still tolerating diet.  Denies any abdominal pain or nausea.  Objective: Vital signs in last 24 hours: Temp:  [98.7 F (37.1 C)-99.5 F (37.5 C)] 98.7 F (37.1 C) (01/10 1509) Pulse Rate:  [58-81] 58  (01/10 1509) Resp:  [18-24] 18  (01/10 1509) BP: (116-140)/(40-57) 116/40 mmHg (01/10 1509) SpO2:  [90 %-96 %] 90 % (01/10 1509) Last BM Date: 05/05/12  Intake/Output from previous day: 01/09 0701 - 01/10 0700 In: 980 [P.O.:80; I.V.:900] Out: 2 [Urine:1; Stool:1] Intake/Output this shift: Total I/O In: 240 [P.O.:240] Out: -   General appearance: alert, cooperative and no distress GI: soft, NT, mild distension, no peritoneal signs  Lab Results:   [facility] 05/07/12 0528 05/06/12 0510  WBC 21.6* 20.7*  HGB 9.8* 10.2*  HCT 31.1* 31.5*  PLT 171 166   BMET  Basename 05/07/12 0528 05/06/12 0510  NA 144 143  K 4.0 3.7  CL 114* 110  CO2 21 23  GLUCOSE 134* 149*  BUN 25* 20  CREATININE 0.75 0.63  CALCIUM 8.5 8.6   PT/INR No results found for this basename: LABPROT:2,INR:2 in the last 72 hours ABG No results found for this basename: PHART:2,PCO2:2,PO2:2,HCO3:2 in the last 72 hours  Studies/Results: No results found.  Anti-infectives: Anti-infectives     Start     Dose/Rate Route Frequency Ordered Stop   05/03/12 1200   piperacillin-tazobactam (ZOSYN) IVPB 3.375 g        3.375 g 12.5 mL/hr over 240 Minutes Intravenous Every 8 hours 05/03/12 0846     05/02/12 1300   piperacillin-tazobactam (ZOSYN) IVPB 2.25 g  Status:  Discontinued        2.25 g 100 mL/hr over 30 Minutes Intravenous 4 times per day 05/02/12 1214 05/03/12 0846          Assessment/Plan: s/p * No surgery found * she continues to feel well. She has no nausea or abdominal pain. She has been afebrile although she still has a persistent elevation in her white blood cell count. After discussion with the patient and her son who is at the bedside and we discussed the possibility of bowel  ischemia given her elevated white blood cell count, thickening on the CT scan, distention, and elevated lactate, however, she again expressed her desire to not undergo any surgical procedure. I asked her specifically if she would be willing to undergo surgery if it would be lifesaving or if we felt that she would die without any surgery and she specifically refused any surgery no matter the circumstance. I do not think that she has any ischemic bowel.  She has been here for 7 days and has not had any fevers and she has only clinically improved in her exam. She is completely nontender and afebrile and she has been tolerating diet. I explained that she likely has some abnormalities such as malignancy or other cause which caused the thickening on CT scan and likely cause her symptoms which required her presentation to the emergency room but since she is refusing surgery of any kind , I do not think that there is anything that Gen. Surgery can offer.  LOS: 7 days    [name] DAVID 05/07/2012

## 2012-05-08 LAB — CBC
HCT: 27.4 % — ABNORMAL LOW (ref 36.0–46.0)
Hemoglobin: 8.7 g/dL — ABNORMAL LOW (ref 12.0–15.0)
MCHC: 31.8 g/dL (ref 30.0–36.0)
MCV: 85.9 fL (ref 78.0–100.0)
RDW: 15.5 % (ref 11.5–15.5)

## 2012-05-08 LAB — LACTIC ACID, PLASMA: Lactic Acid, Venous: 1.2 mmol/L (ref 0.5–2.2)

## 2012-05-08 MED ORDER — HYDROCHLOROTHIAZIDE 25 MG PO TABS
25.0000 mg | ORAL_TABLET | Freq: Every day | ORAL | Status: DC
  Administered 2012-05-08: 25 mg via ORAL
  Filled 2012-05-08 (×2): qty 1

## 2012-05-08 MED ORDER — LORATADINE 10 MG PO TABS
10.0000 mg | ORAL_TABLET | Freq: Every day | ORAL | Status: DC
  Administered 2012-05-08 – 2012-05-17 (×10): 10 mg via ORAL
  Filled 2012-05-08 (×10): qty 1

## 2012-05-08 MED ORDER — SPIRONOLACTONE-HCTZ 25-25 MG PO TABS: 1.0000 | ORAL_TABLET | Freq: Every day | ORAL | Status: DC

## 2012-05-08 MED ORDER — SPIRONOLACTONE 25 MG PO TABS
25.0000 mg | ORAL_TABLET | Freq: Every day | ORAL | Status: DC
  Administered 2012-05-08: 25 mg via ORAL
  Filled 2012-05-08 (×2): qty 1

## 2012-05-08 MED ORDER — ATENOLOL 50 MG PO TABS
50.0000 mg | ORAL_TABLET | Freq: Two times a day (BID) | ORAL | Status: DC
  Administered 2012-05-08 – 2012-05-17 (×19): 50 mg via ORAL
  Filled 2012-05-08 (×20): qty 1

## 2012-05-08 NOTE — Progress Notes (Signed)
[facility] PROGRESS NOTE  [name] ZOX:[id number] DOB: [birth year] DOA: 04/30/2012 PCP: No primary provider on file.  Assessment/Plan: Principal Problem:  *SBO (small bowel obstruction) Active Problems:  Hyponatremia  HTN (hypertension)  Traumatic hematoma of foot  Leukocytosis  Hypokalemia    1. SBO (small bowel obstruction): Patient presented with about 24 hours of abdominal pain, and vomiting. Imaging studies demonstrated small bowel obstruction with transition zone in the anterior central pelvis, associated with bowel wall thickening at the point of obstruction. This could represent an adhesion, though subtle mass lesion is not excluded. There was increased stool throughout colon, and a moderate sized hiatal hernia. Dr Girtha Hake provided surgical consultation and has recommended bowel rest and enemas, NG-T and follow up abdominal films. Clinically, patient appears to be making progress, with decreased NG-T output, and decreased bowel caliber on X-Ray.Continue management per surgeon.  NG-T dc'ed today 1/9, +BM -diet advanced to regular today 1/11 -resolving, and for now pt does not want surgical intervention 2. Hyponatremia: Sodium was 121 at presentation. This is likely secondary to dehydration, as well as pre-admission diuretic therapy. Diuretics are on hold. managing with iv NS, with improvement.  sodium has normalized at 137 on1/7.  -Follow recheck.  3. HTN (hypertension): meds changed to PO  4. Painful right foot hematoma: Patient has a hematoma on the dorsum of her right foot about 4 cm in diameter, following a recent fall. Fortunately, no bony injuries on plain films. Clinically, there is no evidence of infection at this time. This is gradually resolving.  5. Acute urinary retention: This occurred overnight on 05/02/12, and was addressed with Foley catheter. Trial of voiding.  6. Leukocytosis: Patient had a high, persistent leukocytosis, although she is afebrile, CXR  was devoid of infiltrates and urinalysis was negative. Commenced on empiric Zosyn on 05/02/12.  -Blood cultures grew GPR in identified as clostridium perfringens, appreciate ID assistance will, continue zosyn and follow repeat cultures  -Urine culture grew E. Coli.  -still increasing even with treatment as above, discussed with ID -lactic acid level elevated 1/10- with this there is concern for the possibility of necrotic bowel although pt has no symptoms c/w this at this time, her sbo is resolving. I discussed this with pt and son and she voices understanding and states that at this age she does not want to have any surgery done. Also discussed pt with Dr Biagio Quint who states since pt does not want surgery will continue conservative management -recheck lactic acid leval normalized at 1.2 today1/11 and leukocytosis improving 7. UTI, Ecoli/enterococcus: both sensitive to zosyn, abx dc'ed per ID 1/10  8.Clostridium perfringens Bacteremia - appreciate ID assistance, repeat cultures with no growth>>abx dc'ed per ID 1/10. Pt remaining afebrile and hemodynamically stable. 8. Sore throat/Dysphonia: Patient developed a sore throat and hoarseness, after reinsertion of displaced NG-T on 05/02/11. Throat appeared clear, without hyperemia or exudate. Managed successfuly with  with Chloraseptic spray. -improved today 1/9 s/p NGT removal  9. AKI: Had jump in creatinine from 0.93 to 1.72, overnight on 05/02/12. Responded to iv fluids, and creatinine has normalized at 0.86 as of 05/03/12- and remains wnl.   10.?dysphagia: swallow eval per ST  Code Status: DNR/DNI Family Communication: son at bedside Disposition Plan: To be determined.    Brief narrative: 79 y.o. Female with history of Hypertension, arrhythmia, previous cataract extraction, IBS and chronic constipation, presenting with abdominal pain, nausea and vomiting since 04/30/12, without o diarrhea. She had some URI symptoms all last week. She  has never had a  colonoscopy done in her life. Admitted for further evaluation and management.     Consultants:  Almond Lint, surgeon.   Procedures:  CXR.  Abdominal X-Ray.  X-Ray right foot.  CT Abdomen/pelvis.   Antibiotics:  N/A.   HPI/Subjective: States she starts to cough when she eats, son at bedside Objective: Vital signs in last 24 hours: Temp:  [98.2 F (36.8 C)-99.1 F (37.3 C)] 99.1 F (37.3 C) (01/11 0602) Pulse Rate:  [58-82] 74  (01/11 0602) Resp:  [18-24] 24  (01/11 0602) BP: (116-151)/(40-62) 151/61 mmHg (01/11 0602) SpO2:  [90 %-98 %] 96 % (01/11 0602) Weight change:  Last BM Date: 05/07/12  Intake/Output from previous day: 01/10 0701 - 01/11 0700 In: 1140 [P.O.:240; I.V.:900] Out: -      Physical Exam: General: Comfortable, alert, communicative, fully oriented, not short of breath at rest.   HEENT:  No clinical pallor, no jaundice, no conjunctival injection or discharge. Hydration is fair.  NECK:  Supple, JVP not seen, no carotid bruits, no palpable lymphadenopathy, no palpable goiter. CHEST:  Rhonchi greater in L. Lung base, no wheezes, no crackles. HEART:  Sounds 1 and 2 heard, normal, regular, no murmurs. ABDOMEN:  Mildly distended, non-tender, no palpable organomegaly, no palpable masses, normal bowel sounds.. LOWER EXTREMITIES:  No  edema, palpable peripheral pulses. Patient has a 4 cm diameter cystic , mildly tender swelling with overlying bruising, on the dorsum of her right foot, consistent with hematoma. Not clinically infected. Now visibly smaller.  CENTRAL NERVOUS SYSTEM:  No focal neurologic deficit on gross examination.  Lab Results:  [facility] 05/08/12 0541 05/07/12 0528  WBC 17.0* 21.6*  HGB 8.7* 9.8*  HCT 27.4* 31.1*  PLT 191 171    Basename 05/07/12 0528 05/06/12 0510  NA 144 143  K 4.0 3.7  CL 114* 110  CO2 21 23  GLUCOSE 134* 149*  BUN 25* 20  CREATININE 0.75 0.63  CALCIUM 8.5 8.6   Recent Results (from the past 240 hour(s))    CULTURE, BLOOD (ROUTINE X 2)     Status: Normal   Collection Time   05/02/12 12:14 PM      Component Value Range Status Comment   Specimen Description BLOOD LEFT ARM   Final    Special Requests     Final    Value: BOTTLES DRAWN AEROBIC AND ANAEROBIC 10CC BOTH BOTTLES   Culture  Setup Time 05/02/2012 17:19   Final    Culture     Final    Value: CLOSTRIDIUM PERFRINGENS     Note: Gram Stain Report Called to,Read Back By and Verified With: Mignon Pine RN on 05/03/12 at 04:37 by Christie Nottingham   Report Status 05/04/2012 FINAL   Final   CULTURE, BLOOD (ROUTINE X 2)     Status: Normal   Collection Time   05/02/12 12:14 PM      Component Value Range Status Comment   Specimen Description BLOOD LEFT ARM   Final    Special Requests     Final    Value: BOTTLES DRAWN AEROBIC AND ANAEROBIC 6CC BOTH BOTTLES   Culture  Setup Time 05/02/2012 17:20   Final    Culture     Final    Value: CLOSTRIDIUM PERFRINGENS     Note: Gram Stain Report Called to,Read Back By and Verified With: Mignon Pine RN on 05/03/12 at 04:37 by Christie Nottingham   Report Status 05/04/2012 FINAL   Final   URINE  CULTURE     Status: Normal   Collection Time   05/02/12  2:03 PM      Component Value Range Status Comment   Specimen Description URINE, CATHETERIZED   Final    Special Requests none   Final    Culture  Setup Time 05/02/2012 17:24   Final    Colony Count >=100,000 COLONIES/ML   Final    Culture     Final    Value: ESCHERICHIA COLI     ENTEROCOCCUS SPECIES   Report Status 05/06/2012 FINAL   Final    Organism ID, Bacteria ESCHERICHIA COLI   Final    Organism ID, Bacteria ENTEROCOCCUS SPECIES   Final   CULTURE, BLOOD (ROUTINE X 2)     Status: Normal (Preliminary result)   Collection Time   05/05/12  6:00 PM      Component Value Range Status Comment   Specimen Description BLOOD RIGHT HAND   Final    Special Requests BOTTLES DRAWN AEROBIC AND ANAEROBIC 10CC   Final    Culture  Setup Time 05/05/2012 22:35   Final    Culture     Final     Value:        BLOOD CULTURE RECEIVED NO GROWTH TO DATE CULTURE WILL BE HELD FOR 5 DAYS BEFORE ISSUING A FINAL NEGATIVE REPORT   Report Status PENDING   Incomplete   CULTURE, BLOOD (ROUTINE X 2)     Status: Normal (Preliminary result)   Collection Time   05/05/12  6:30 PM      Component Value Range Status Comment   Specimen Description BLOOD RIGHT ARM   Final    Special Requests BOTTLES DRAWN AEROBIC AND ANAEROBIC 10CC   Final    Culture  Setup Time 05/05/2012 22:35   Final    Culture     Final    Value:        BLOOD CULTURE RECEIVED NO GROWTH TO DATE CULTURE WILL BE HELD FOR 5 DAYS BEFORE ISSUING A FINAL NEGATIVE REPORT   Report Status PENDING   Incomplete   CLOSTRIDIUM DIFFICILE BY PCR     Status: Normal   Collection Time   05/06/12  2:53 PM      Component Value Range Status Comment   C difficile by pcr NEGATIVE  NEGATIVE Final      Studies/Results: No results found.  Medications: Scheduled Meds:    . docusate sodium  100 mg Oral BID  . lip balm  1 application Topical BID  . metoprolol  2.5 mg Intravenous Q6H  . pantoprazole  40 mg Oral BID  . piperacillin-tazobactam (ZOSYN)  IV  3.375 g Intravenous Q8H  . potassium chloride  20 mEq Oral BID  . sodium chloride  3 mL Intravenous Q12H   Continuous Infusions:    . sodium chloride 75 mL/hr at 05/07/12 1912   PRN Meds:.acetaminophen, acetaminophen, alum & mag hydroxide-simeth, bisacodyl, hydrALAZINE, magic mouthwash, menthol-cetylpyridinium, morphine injection, ondansetron (ZOFRAN) IV, ondansetron, phenol    LOS: 8 days   [facility]  [facility] Pager [phone]. If 8PM-8AM, please contact night-coverage at [email or web address], password [facility] 05/08/2012, 10:11 AM  LOS: 8 days

## 2012-05-08 NOTE — Progress Notes (Signed)
CSW spoke with son and Pt at the bedside to assess facility that was chosen.   Pt and son agree that [facility] is their choice of facility placement, and if for some reason that facility would become unavailable, then their second choice would be [place].   CSW will continue to follow as needed and weekday CSW will assist with d/c planning.   Leron Croak, LCSWA [employer] [phone]

## 2012-05-09 LAB — GLUCOSE, CAPILLARY: Glucose-Capillary: 135 mg/dL — ABNORMAL HIGH (ref 70–99)

## 2012-05-09 LAB — BASIC METABOLIC PANEL
BUN: 19 mg/dL (ref 6–23)
CO2: 22 mEq/L (ref 19–32)
Calcium: 8.1 mg/dL — ABNORMAL LOW (ref 8.4–10.5)
Chloride: 114 mEq/L — ABNORMAL HIGH (ref 96–112)
Creatinine, Ser: 0.84 mg/dL (ref 0.50–1.10)
GFR calc Af Amer: 69 mL/min — ABNORMAL LOW (ref >90)

## 2012-05-09 LAB — CBC
HCT: 27.7 % — ABNORMAL LOW (ref 36.0–46.0)
MCH: 27.4 pg (ref 26.0–34.0)
MCV: 86.3 fL (ref 78.0–100.0)
RDW: 15.6 % — ABNORMAL HIGH (ref 11.5–15.5)
WBC: 15.5 10*3/uL — ABNORMAL HIGH (ref 4.0–10.5)

## 2012-05-09 MED ORDER — SPIRONOLACTONE 25 MG PO TABS
25.0000 mg | ORAL_TABLET | Freq: Every day | ORAL | Status: DC
  Administered 2012-05-09 – 2012-05-17 (×9): 25 mg via ORAL
  Filled 2012-05-09 (×9): qty 1

## 2012-05-09 MED ORDER — SODIUM CHLORIDE 0.45 % IV SOLN
INTRAVENOUS | Status: DC
  Administered 2012-05-09: 09:00:00 via INTRAVENOUS
  Administered 2012-05-10: 20 mL/h via INTRAVENOUS

## 2012-05-09 MED ORDER — HYDROCHLOROTHIAZIDE 25 MG PO TABS
12.5000 mg | ORAL_TABLET | Freq: Every day | ORAL | Status: DC
  Filled 2012-05-09: qty 0.5

## 2012-05-09 NOTE — Progress Notes (Signed)
ANTIBIOTIC CONSULT NOTE - FOLLOW UP  Pharmacy Consult for Zosyn Indication: leukocytosis, unknown source  Allergies  Allergen Reactions  . Neosporin (Neomycin-Bacitracin Zn-Polymyx)   . Sulfa Antibiotics     Patient Measurements: Height: 5\' 4"  (162.6 cm) Weight: 149 lb 4 oz (67.7 kg) IBW/kg (Calculated) : 54.7   Vital Signs: Temp: 98.6 F (37 C) (01/12 0545) Temp src: Oral (01/12 0545) BP: 141/57 mmHg (01/12 0545) Pulse Rate: 66  (01/12 0545) Intake/Output from previous day: 01/11 0701 - 01/12 0700 In: 2175 [P.O.:600; I.V.:1125; IV Piggyback:450] Out: -   Labs:  Basename 05/09/12 0519 05/08/12 0541 05/07/12 0528  WBC 15.5* 17.0* 21.6*  HGB 8.8* 8.7* 9.8*  PLT 214 191 171  LABCREA -- -- --  CREATININE 0.84 -- 0.75   Estimated Creatinine Clearance: 42.9 ml/min (by C-G formula based on Cr of 0.84).  Microbiology: Recent Results (from the past 720 hour(s))  CULTURE, BLOOD (ROUTINE X 2)     Status: Normal   Collection Time   05/02/12 12:14 PM      Component Value Range Status Comment   Specimen Description BLOOD LEFT ARM   Final    Special Requests     Final    Value: BOTTLES DRAWN AEROBIC AND ANAEROBIC 10CC BOTH BOTTLES   Culture  Setup Time 05/02/2012 17:19   Final    Culture     Final    Value: CLOSTRIDIUM PERFRINGENS     Note: Gram Stain Report Called to,Read Back By and Verified With: Mignon Pine RN on 05/03/12 at 04:37 by Christie Nottingham   Report Status 05/04/2012 FINAL   Final   CULTURE, BLOOD (ROUTINE X 2)     Status: Normal   Collection Time   05/02/12 12:14 PM      Component Value Range Status Comment   Specimen Description BLOOD LEFT ARM   Final    Special Requests     Final    Value: BOTTLES DRAWN AEROBIC AND ANAEROBIC 6CC BOTH BOTTLES   Culture  Setup Time 05/02/2012 17:20   Final    Culture     Final    Value: CLOSTRIDIUM PERFRINGENS     Note: Gram Stain Report Called to,Read Back By and Verified With: Mignon Pine RN on 05/03/12 at 04:37 by Christie Nottingham   Report Status 05/04/2012 FINAL   Final   URINE CULTURE     Status: Normal   Collection Time   05/02/12  2:03 PM      Component Value Range Status Comment   Specimen Description URINE, CATHETERIZED   Final    Special Requests none   Final    Culture  Setup Time 05/02/2012 17:24   Final    Colony Count >=100,000 COLONIES/ML   Final    Culture     Final    Value: ESCHERICHIA COLI     ENTEROCOCCUS SPECIES   Report Status 05/06/2012 FINAL   Final    Organism ID, Bacteria ESCHERICHIA COLI   Final    Organism ID, Bacteria ENTEROCOCCUS SPECIES   Final   CULTURE, BLOOD (ROUTINE X 2)     Status: Normal (Preliminary result)   Collection Time   05/05/12  6:00 PM      Component Value Range Status Comment   Specimen Description BLOOD RIGHT HAND   Final    Special Requests BOTTLES DRAWN AEROBIC AND ANAEROBIC 10CC   Final    Culture  Setup Time 05/05/2012 22:35   Final    Culture  Final    Value:        BLOOD CULTURE RECEIVED NO GROWTH TO DATE CULTURE WILL BE HELD FOR 5 DAYS BEFORE ISSUING A FINAL NEGATIVE REPORT   Report Status PENDING   Incomplete   CULTURE, BLOOD (ROUTINE X 2)     Status: Normal (Preliminary result)   Collection Time   05/05/12  6:30 PM      Component Value Range Status Comment   Specimen Description BLOOD RIGHT ARM   Final    Special Requests BOTTLES DRAWN AEROBIC AND ANAEROBIC 10CC   Final    Culture  Setup Time 05/05/2012 22:35   Final    Culture     Final    Value:        BLOOD CULTURE RECEIVED NO GROWTH TO DATE CULTURE WILL BE HELD FOR 5 DAYS BEFORE ISSUING A FINAL NEGATIVE REPORT   Report Status PENDING   Incomplete   CLOSTRIDIUM DIFFICILE BY PCR     Status: Normal   Collection Time   05/06/12  2:53 PM      Component Value Range Status Comment   C difficile by pcr NEGATIVE  NEGATIVE Final     Anti-infectives     Start     Dose/Rate Route Frequency Ordered Stop   05/03/12 1200  piperacillin-tazobactam (ZOSYN) IVPB 3.375 g       3.375 g 12.5 mL/hr over 240 Minutes  Intravenous Every 8 hours 05/03/12 0846     05/02/12 1300   piperacillin-tazobactam (ZOSYN) IVPB 2.25 g  Status:  Discontinued        2.25 g 100 mL/hr over 30 Minutes Intravenous 4 times per day 05/02/12 1214 05/03/12 0846          Assessment:  89 YOF admitted with SBO and abd pain, WBC trending up.  Started on empiric antibiotics.  Found to have clostridium in blood culture (not likely significant) and Ecoli/enterococcus UTI.  Followed by ID.  Day #8 Zosyn  Renal function remains stable, CrCl ~ 43 ml/min (CG)  Goal of Therapy:  Appropriate abx dosing, eradication of infection.  Plan:   Continue Zosyn 3.375g IV Q8H infused over 4hrs.  Follow up duration, renal function and cultures as available.  Lynann Beaver PharmD, BCPS Pager [phone] 05/09/2012 12:39 PM

## 2012-05-09 NOTE — Progress Notes (Signed)
[facility] PROGRESS NOTE  [name] ZOX:[id number] DOB: Sep 26, 1920 DOA: 04/30/2012 PCP: No primary provider on file.  Assessment/Plan: Principal Problem:  *SBO (small bowel obstruction) Active Problems:  Hyponatremia  HTN (hypertension)  Traumatic hematoma of foot  Leukocytosis  Hypokalemia    1. SBO (small bowel obstruction): Patient presented with about 24 hours of abdominal pain, and vomiting. Imaging studies demonstrated small bowel obstruction with transition zone in the anterior central pelvis, associated with bowel wall thickening at the point of obstruction. This could represent an adhesion, though subtle mass lesion is not excluded. There was increased stool throughout colon, and a moderate sized hiatal hernia. Dr Girtha Hake provided surgical consultation and has recommended bowel rest and enemas, NG-T and follow up abdominal films. Clinically, patient appears to be making progress, with decreased NG-T output, and decreased bowel caliber on X-Ray.Continue management per surgeon.  NG-T dc'ed today 1/9, +BM -diet advanced to regular today 1/11 -resolving, and pt does not want surgical intervention 2. Hyponatremia: Sodium was 121 at presentation. This is likely secondary to dehydration, as well as pre-admission diuretic therapy. Diuretics are on hold. managing with iv NS, with improvement.  sodium has normalized at 137 on1/7.  -Follow recheck.  3. HTN (hypertension): meds changed to PO  4. Painful right foot hematoma: Patient has a hematoma on the dorsum of her right foot about 4 cm in diameter, following a recent fall. Fortunately, no bony injuries on plain films. Clinically, there is no evidence of infection at this time. This is gradually resolving.  5. Acute urinary retention: This occurred overnight on 05/02/12, and was addressed with Foley catheter. Trial of voiding.  6. Leukocytosis: Patient had a high, persistent leukocytosis, although she is afebrile, CXR was  devoid of infiltrates and urinalysis was negative. Commenced on empiric Zosyn on 05/02/12.  -Blood cultures grew GPR in identified as clostridium perfringens, appreciate ID assistance will, continue zosyn and follow repeat cultures  -Urine culture grew E. Coli.  -still increasing even with treatment as above, discussed with ID -lactic acid level elevated 1/10- with this there is concern for the possibility of necrotic bowel although pt has no symptoms c/w this at this time, her sbo is resolving. I discussed this with pt and son and she voices understanding and states that at this age she does not want to have any surgery done. Also discussed pt with Dr Biagio Quint who states since pt does not want surgery will continue conservative management -recheck lactic acid leval normalized at 1.2 on 1/11 and leukocytosis continues to decrease-15 today 1/12 7. UTI, Ecoli/enterococcus: both sensitive to zosyn, abx dc'ed per ID 1/10  8.Clostridium perfringens Bacteremia - appreciate ID assistance, repeat cultures with no growth>>abx dc'ed per ID 1/10. Pt remaining afebrile and hemodynamically stable. 8. Sore throat/Dysphonia: Patient developed a sore throat and hoarseness, after reinsertion of displaced NG-T on 05/02/11. Throat appeared clear, without hyperemia or exudate. Managed successfuly with  with Chloraseptic spray. -improved today 1/9 s/p NGT removal  9. AKI: Had jump in creatinine from 0.93 to 1.72, overnight on 05/02/12. Responded to iv fluids, and creatinine has normalized at 0.86 as of 05/03/12- and remains wnl.   10.?dysphagia: Awaiting swallow eval per ST 11. Diarrhea : She is on Colace twice a day-will DC and follow Patient has had C. difficile PCR which came back negative 12. Hypernatremia -#11 likely contributing Will change IV fluids to hypotonic, also decreased HCTZ follow and recheck. Code Status: DNR/DNI Family Communication: son at bedside Disposition Plan: Possible  SNF in a.m. if continues to improve      Brief narrative: 79 y.o. Female with history of Hypertension, arrhythmia, previous cataract extraction, IBS and chronic constipation, presenting with abdominal pain, nausea and vomiting since 04/30/12, without o diarrhea. She had some URI symptoms all last week. She has never had a colonoscopy done in her life. Admitted for further evaluation and management.     Consultants:  Almond Lint, surgeon.   Procedures:  CXR.  Abdominal X-Ray.  X-Ray right foot.  CT Abdomen/pelvis.   Antibiotics:  N/A.   HPI/Subjective: complaining of diarrhea, denies abdominal pain also denies shortness of breath  Objective: Vital signs in last 24 hours: Temp:  [98.4 F (36.9 C)-98.6 F (37 C)] 98.6 F (37 C) (01/12 0545) Pulse Rate:  [66-85] 66  (01/12 0545) Resp:  [16-20] 16  (01/12 0545) BP: (136-172)/(54-67) 141/57 mmHg (01/12 0545) SpO2:  [95 %-98 %] 95 % (01/12 0545) Weight change:  Last BM Date: 05/09/12  Intake/Output from previous day: 01/11 0701 - 01/12 0700 In: 2175 [P.O.:600; I.V.:1125; IV Piggyback:450] Out: -      Physical Exam: General: Comfortable, alert, communicative, fully oriented, not short of breath at rest.   HEENT:  No clinical pallor, no jaundice, no conjunctival injection or discharge. Hydration is fair.  NECK:  Supple, JVP not seen, no carotid bruits, no palpable lymphadenopathy, no palpable goiter. CHEST:  Rhonchi greater in L. Lung base, no wheezes, no crackles. HEART:  Sounds 1 and 2 heard, normal, regular, no murmurs. ABDOMEN:  Mildly distended, non-tender, no palpable organomegaly, no palpable masses, normal bowel sounds.. LOWER EXTREMITIES:  No  edema, palpable peripheral pulses. Patient has a 4 cm diameter cystic , mildly tender swelling with overlying bruising, on the dorsum of her right foot, consistent with hematoma. Not clinically infected. Now visibly smaller.  CENTRAL NERVOUS SYSTEM:  No focal neurologic deficit on gross examination.  Lab  Results:  [facility] 05/09/12 0519 05/08/12 0541  WBC 15.5* 17.0*  HGB 8.8* 8.7*  HCT 27.7* 27.4*  PLT 214 191    Basename 05/09/12 0519 05/07/12 0528  NA 146* 144  K 4.1 4.0  CL 114* 114*  CO2 22 21  GLUCOSE 119* 134*  BUN 19 25*  CREATININE 0.84 0.75  CALCIUM 8.1* 8.5   Recent Results (from the past 240 hour(s))  CULTURE, BLOOD (ROUTINE X 2)     Status: Normal   Collection Time   05/02/12 12:14 PM      Component Value Range Status Comment   Specimen Description BLOOD LEFT ARM   Final    Special Requests     Final    Value: BOTTLES DRAWN AEROBIC AND ANAEROBIC 10CC BOTH BOTTLES   Culture  Setup Time 05/02/2012 17:19   Final    Culture     Final    Value: CLOSTRIDIUM PERFRINGENS     Note: Gram Stain Report Called to,Read Back By and Verified With: Mignon Pine RN on 05/03/12 at 04:37 by Christie Nottingham   Report Status 05/04/2012 FINAL   Final   CULTURE, BLOOD (ROUTINE X 2)     Status: Normal   Collection Time   05/02/12 12:14 PM      Component Value Range Status Comment   Specimen Description BLOOD LEFT ARM   Final    Special Requests     Final    Value: BOTTLES DRAWN AEROBIC AND ANAEROBIC 6CC BOTH BOTTLES   Culture  Setup Time 05/02/2012 17:20   Final  Culture     Final    Value: CLOSTRIDIUM PERFRINGENS     Note: Gram Stain Report Called to,Read Back By and Verified With: Mignon Pine RN on 05/03/12 at 04:37 by Christie Nottingham   Report Status 05/04/2012 FINAL   Final   URINE CULTURE     Status: Normal   Collection Time   05/02/12  2:03 PM      Component Value Range Status Comment   Specimen Description URINE, CATHETERIZED   Final    Special Requests none   Final    Culture  Setup Time 05/02/2012 17:24   Final    Colony Count >=100,000 COLONIES/ML   Final    Culture     Final    Value: ESCHERICHIA COLI     ENTEROCOCCUS SPECIES   Report Status 05/06/2012 FINAL   Final    Organism ID, Bacteria ESCHERICHIA COLI   Final    Organism ID, Bacteria ENTEROCOCCUS SPECIES   Final   CULTURE,  BLOOD (ROUTINE X 2)     Status: Normal (Preliminary result)   Collection Time   05/05/12  6:00 PM      Component Value Range Status Comment   Specimen Description BLOOD RIGHT HAND   Final    Special Requests BOTTLES DRAWN AEROBIC AND ANAEROBIC 10CC   Final    Culture  Setup Time 05/05/2012 22:35   Final    Culture     Final    Value:        BLOOD CULTURE RECEIVED NO GROWTH TO DATE CULTURE WILL BE HELD FOR 5 DAYS BEFORE ISSUING A FINAL NEGATIVE REPORT   Report Status PENDING   Incomplete   CULTURE, BLOOD (ROUTINE X 2)     Status: Normal (Preliminary result)   Collection Time   05/05/12  6:30 PM      Component Value Range Status Comment   Specimen Description BLOOD RIGHT ARM   Final    Special Requests BOTTLES DRAWN AEROBIC AND ANAEROBIC 10CC   Final    Culture  Setup Time 05/05/2012 22:35   Final    Culture     Final    Value:        BLOOD CULTURE RECEIVED NO GROWTH TO DATE CULTURE WILL BE HELD FOR 5 DAYS BEFORE ISSUING A FINAL NEGATIVE REPORT   Report Status PENDING   Incomplete   CLOSTRIDIUM DIFFICILE BY PCR     Status: Normal   Collection Time   05/06/12  2:53 PM      Component Value Range Status Comment   C difficile by pcr NEGATIVE  NEGATIVE Final      Studies/Results: No results found.  Medications: Scheduled Meds:    . atenolol  50 mg Oral BID  . docusate sodium  100 mg Oral BID  . hydrochlorothiazide  12.5 mg Oral Daily   And  . spironolactone  25 mg Oral Daily  . lip balm  1 application Topical BID  . loratadine  10 mg Oral Daily  . pantoprazole  40 mg Oral BID  . piperacillin-tazobactam (ZOSYN)  IV  3.375 g Intravenous Q8H  . potassium chloride  20 mEq Oral BID  . sodium chloride  3 mL Intravenous Q12H   Continuous Infusions:    . sodium chloride 50 mL/hr at 05/09/12 0856  . sodium chloride 75 mL/hr at 05/08/12 2137   PRN Meds:.acetaminophen, acetaminophen, alum & mag hydroxide-simeth, bisacodyl, hydrALAZINE, magic mouthwash, menthol-cetylpyridinium, morphine  injection, ondansetron (ZOFRAN) IV, ondansetron, phenol  LOS: 9 days   [facility]  [facility] Pager [phone]. If 8PM-8AM, please contact night-coverage at [email or web address], password [facility] 05/09/2012, 12:47 PM  LOS: 9 days

## 2012-05-10 ENCOUNTER — Inpatient Hospital Stay (HOSPITAL_COMMUNITY): Payer: Medicare Other

## 2012-05-10 DIAGNOSIS — J189 Pneumonia, unspecified organism: Secondary | ICD-10-CM | POA: Diagnosis present

## 2012-05-10 LAB — BASIC METABOLIC PANEL
BUN: 12 mg/dL (ref 6–23)
BUN: 13 mg/dL (ref 6–23)
CO2: 21 mEq/L (ref 19–32)
CO2: 21 mEq/L (ref 19–32)
Chloride: 107 mEq/L (ref 96–112)
Chloride: 104 mEq/L (ref 96–112)
Creatinine, Ser: 0.75 mg/dL (ref 0.50–1.10)
Glucose, Bld: 120 mg/dL — ABNORMAL HIGH (ref 70–99)
Glucose, Bld: 113 mg/dL — ABNORMAL HIGH (ref 70–99)
Potassium: 3.2 mEq/L — ABNORMAL LOW (ref 3.5–5.1)

## 2012-05-10 LAB — CBC
HCT: 26.6 % — ABNORMAL LOW (ref 36.0–46.0)
Hemoglobin: 8.4 g/dL — ABNORMAL LOW (ref 12.0–15.0)
MCHC: 31.6 g/dL (ref 30.0–36.0)
WBC: 27.5 10*3/uL — ABNORMAL HIGH (ref 4.0–10.5)

## 2012-05-10 MED ORDER — POTASSIUM CHLORIDE CRYS ER 20 MEQ PO TBCR
40.0000 meq | EXTENDED_RELEASE_TABLET | Freq: Once | ORAL | Status: AC
  Administered 2012-05-10: 40 meq via ORAL
  Filled 2012-05-10: qty 2

## 2012-05-10 MED ORDER — SACCHAROMYCES BOULARDII 250 MG PO CAPS
250.0000 mg | ORAL_CAPSULE | Freq: Two times a day (BID) | ORAL | Status: DC
  Administered 2012-05-10 – 2012-05-17 (×14): 250 mg via ORAL
  Filled 2012-05-10 (×15): qty 1

## 2012-05-10 MED ORDER — HYDROCHLOROTHIAZIDE 12.5 MG PO CAPS
12.5000 mg | ORAL_CAPSULE | Freq: Every day | ORAL | Status: DC
  Administered 2012-05-10: 12.5 mg via ORAL
  Filled 2012-05-10 (×2): qty 1

## 2012-05-10 MED ORDER — VANCOMYCIN HCL 500 MG IV SOLR
500.0000 mg | Freq: Two times a day (BID) | INTRAVENOUS | Status: DC
  Administered 2012-05-10 – 2012-05-11 (×2): 500 mg via INTRAVENOUS
  Filled 2012-05-10 (×2): qty 500

## 2012-05-10 MED ORDER — PIPERACILLIN-TAZOBACTAM 3.375 G IVPB
3.3750 g | Freq: Three times a day (TID) | INTRAVENOUS | Status: DC
  Administered 2012-05-10 – 2012-05-11 (×2): 3.375 g via INTRAVENOUS
  Filled 2012-05-10 (×3): qty 50

## 2012-05-10 MED ORDER — STARCH (THICKENING) PO POWD
ORAL | Status: DC | PRN
  Filled 2012-05-10: qty 227

## 2012-05-10 NOTE — Progress Notes (Addendum)
[facility] PROGRESS NOTE  [name] ZOX:[id number] DOB: 28-Jul-1920 DOA: 04/30/2012 PCP: No primary provider on file.  Assessment/Plan: Principal Problem:  *SBO (small bowel obstruction) Active Problems:  Hyponatremia  HTN (hypertension)  Traumatic hematoma of foot  Leukocytosis  Hypokalemia    1. SBO (small bowel obstruction): Patient presented with about 24 hours of abdominal pain, and vomiting. Imaging studies demonstrated small bowel obstruction with transition zone in the anterior central pelvis, associated with bowel wall thickening at the point of obstruction. This could represent an adhesion, though subtle mass lesion is not excluded. There was increased stool throughout colon, and a moderate sized hiatal hernia. Dr Girtha Hake provided surgical consultation and has recommended bowel rest and enemas, NG-T and follow up abdominal films. Clinically, patient appears to be making progress, with decreased NG-T output, and decreased bowel caliber on X-Ray.Continue management per surgeon.  NG-T dc'ed today 1/9, +BM -diet advanced to regular today 1/11 -resolving, and pt does not want surgical intervention 2. Hyponatremia: Sodium was 121 at presentation. This is likely secondary to dehydration, as well as pre-admission diuretic therapy. Diuretics are on hold. managing with iv NS, with improvement.  sodium has normalized at 137 on1/7.  -Follow recheck.  3. HTN (hypertension): meds changed to PO  4. Painful right foot hematoma: Patient has a hematoma on the dorsum of her right foot about 4 cm in diameter, following a recent fall. Fortunately, no bony injuries on plain films. Clinically, there is no evidence of infection at this time. This is gradually resolving.  5. Acute urinary retention: This occurred overnight on 05/02/12, and was addressed with Foley catheter. Trial of voiding.  6. Leukocytosis: Patient had a high, persistent leukocytosis, although she is afebrile, CXR was  devoid of infiltrates and urinalysis was negative. Commenced on empiric Zosyn on 05/02/12.  -Blood cultures grew GPR in identified as clostridium perfringens, appreciate ID assistance will, continue zosyn and follow repeat cultures  -Urine culture grew E. Coli.  -still increasing even with treatment as above, discussed with ID -lactic acid level elevated 1/10- with this there is concern for the possibility of necrotic bowel although pt has no symptoms c/w this at this time, her sbo is resolving. I discussed this with pt and son and she voices understanding and states that at this age she does not want to have any surgery done. Also discussed pt with Dr Biagio Quint who states since pt does not want surgery will continue conservative management -recheck lactic acid leval normalized at 1.2 on 1/11 and leukocytosis continues to decrease-15 today 1/12 -wbc back up to 27 today 1/13( from 15), and pt with increased cough and some dyspnea - will obtain cxr to further eval and follow 7. UTI, Ecoli/enterococcus: both sensitive to zosyn,  per ID note on  1/10 - to follow and d/c abx if Pt remaining afebrile and hemodynamically stable. 8.Clostridium perfringens Bacteremia - appreciate ID assistance, repeat cultures with no growth>>per ID 1/10 to follow and d/c abx if Pt remaining afebrile and hemodynamically stable. 8. Sore throat/Dysphonia: Patient developed a sore throat and hoarseness, after reinsertion of displaced NG-T on 05/02/11. Throat appeared clear, without hyperemia or exudate. Managed successfuly with  with Chloraseptic spray. -improved today 1/9 s/p NGT removal  9. AKI: Had jump in creatinine from 0.93 to 1.72, overnight on 05/02/12. Responded to iv fluids, and creatinine has normalized at 0.86 as of 05/03/12- and remains wnl.   10.?dysphagia: Awaiting swallow eval per ST 11. Diarrhea : decreasing follwing d/c of  Colace  Patient has had C. difficile PCR which came back negative 12. Hypernatremia -#11 likely  contributing -changed  to hypotonic ivf on 1/12, also decreased HCTZ follow and recheck. - resolved 1/13 13. Hypokalemia -replace k Code Status: DNR/DNI Family Communication: son at bedside Disposition Plan: Possible SNF in a.m. if continues to improve    Brief narrative: 79 y.o. Female with history of Hypertension, arrhythmia, previous cataract extraction, IBS and chronic constipation, presenting with abdominal pain, nausea and vomiting since 04/30/12, without o diarrhea. She had some URI symptoms all last week. She has never had a colonoscopy done in her life. Admitted for further evaluation and management.     Consultants:  Almond Lint, surgeon.   Procedures:  CXR.  Abdominal X-Ray.  X-Ray right foot.  CT Abdomen/pelvis.   Antibiotics:  zoysn  vanc started 1/13.   HPI/Subjective: complaining dyspnea, +cough. Per nsg diarrhea decreasing Objective: Vital signs in last 24 hours: Temp:  [98.3 F (36.8 C)-99.8 F (37.7 C)] 98.3 F (36.8 C) (01/13 1346) Pulse Rate:  [72-124] 76  (01/13 1346) Resp:  [20-22] 20  (01/13 1346) BP: (113-166)/(46-62) 150/53 mmHg (01/13 1346) SpO2:  [93 %-98 %] 95 % (01/13 1346) Weight change:  Last BM Date: 05/10/12  Intake/Output from previous day: 01/12 0701 - 01/13 0700 In: 1330.4 [I.V.:1180.4; IV Piggyback:150] Out: -  Total I/O In: 970 [P.O.:720; I.V.:200; Other:50] Out: -    Physical Exam: General: Comfortable, alert, communicative, fully oriented, not short of breath at rest.   HEENT:  No clinical pallor, no jaundice, no conjunctival injection or discharge. Hydration is fair.  NECK:  Supple, JVP not seen, no carotid bruits, no palpable lymphadenopathy, no palpable goiter. CHEST:  Rhonchi greater in L. Lung base, no wheezes, no crackles. HEART:  Sounds 1 and 2 heard, normal, regular, no murmurs. ABDOMEN:  Mildly distended, non-tender, no palpable organomegaly, no palpable masses, normal bowel sounds.. LOWER EXTREMITIES:  No   edema, palpable peripheral pulses. Patient has a 4 cm diameter cystic , mildly tender swelling with overlying bruising, on the dorsum of her right foot, consistent with hematoma. Not clinically infected. Now visibly smaller.  CENTRAL NERVOUS SYSTEM:  No focal neurologic deficit on gross examination.  Lab Results:  [facility] 05/10/12 0444 05/09/12 0519  WBC 27.5* 15.5*  HGB 8.4* 8.8*  HCT 26.6* 27.7*  PLT 256 214    Basename 05/10/12 0444 05/09/12 0519  NA 139 146*  K 3.2* 4.1  CL 107 114*  CO2 21 22  GLUCOSE 120* 119*  BUN 12 19  CREATININE 0.69 0.84  CALCIUM 8.1* 8.1*   Recent Results (from the past 240 hour(s))  CULTURE, BLOOD (ROUTINE X 2)     Status: Normal   Collection Time   05/02/12 12:14 PM      Component Value Range Status Comment   Specimen Description BLOOD LEFT ARM   Final    Special Requests     Final    Value: BOTTLES DRAWN AEROBIC AND ANAEROBIC 10CC BOTH BOTTLES   Culture  Setup Time 05/02/2012 17:19   Final    Culture     Final    Value: CLOSTRIDIUM PERFRINGENS     Note: Gram Stain Report Called to,Read Back By and Verified With: Mignon Pine RN on 05/03/12 at 04:37 by Christie Nottingham   Report Status 05/04/2012 FINAL   Final   CULTURE, BLOOD (ROUTINE X 2)     Status: Normal   Collection Time   05/02/12 12:14 PM  Component Value Range Status Comment   Specimen Description BLOOD LEFT ARM   Final    Special Requests     Final    Value: BOTTLES DRAWN AEROBIC AND ANAEROBIC 6CC BOTH BOTTLES   Culture  Setup Time 05/02/2012 17:20   Final    Culture     Final    Value: CLOSTRIDIUM PERFRINGENS     Note: Gram Stain Report Called to,Read Back By and Verified With: Mignon Pine RN on 05/03/12 at 04:37 by Christie Nottingham   Report Status 05/04/2012 FINAL   Final   URINE CULTURE     Status: Normal   Collection Time   05/02/12  2:03 PM      Component Value Range Status Comment   Specimen Description URINE, CATHETERIZED   Final    Special Requests none   Final    Culture  Setup Time  05/02/2012 17:24   Final    Colony Count >=100,000 COLONIES/ML   Final    Culture     Final    Value: ESCHERICHIA COLI     ENTEROCOCCUS SPECIES   Report Status 05/06/2012 FINAL   Final    Organism ID, Bacteria ESCHERICHIA COLI   Final    Organism ID, Bacteria ENTEROCOCCUS SPECIES   Final   CULTURE, BLOOD (ROUTINE X 2)     Status: Normal (Preliminary result)   Collection Time   05/05/12  6:00 PM      Component Value Range Status Comment   Specimen Description BLOOD RIGHT HAND   Final    Special Requests BOTTLES DRAWN AEROBIC AND ANAEROBIC 10CC   Final    Culture  Setup Time 05/05/2012 22:35   Final    Culture     Final    Value:        BLOOD CULTURE RECEIVED NO GROWTH TO DATE CULTURE WILL BE HELD FOR 5 DAYS BEFORE ISSUING A FINAL NEGATIVE REPORT   Report Status PENDING   Incomplete   CULTURE, BLOOD (ROUTINE X 2)     Status: Normal (Preliminary result)   Collection Time   05/05/12  6:30 PM      Component Value Range Status Comment   Specimen Description BLOOD RIGHT ARM   Final    Special Requests BOTTLES DRAWN AEROBIC AND ANAEROBIC 10CC   Final    Culture  Setup Time 05/05/2012 22:35   Final    Culture     Final    Value:        BLOOD CULTURE RECEIVED NO GROWTH TO DATE CULTURE WILL BE HELD FOR 5 DAYS BEFORE ISSUING A FINAL NEGATIVE REPORT   Report Status PENDING   Incomplete   CLOSTRIDIUM DIFFICILE BY PCR     Status: Normal   Collection Time   05/06/12  2:53 PM      Component Value Range Status Comment   C difficile by pcr NEGATIVE  NEGATIVE Final      Studies/Results: Dg Chest Port 1 View  05/10/2012  *RADIOLOGY REPORT*  Clinical Data: Cough.  PORTABLE CHEST - 1 VIEW  Comparison: 05/01/2012.  Findings: The cardiac silhouette, mediastinal and hilar contours are normal and stable.  There is tortuosity and calcification of the thoracic aorta.  Interval worsening lung aeration with possible asymmetric edema or bilateral infiltrates.  No pleural effusion. The bony thorax is intact   IMPRESSION: Interval worsening lung aeration possibly due to asymmetric edema or bilateral infiltrates.   Original Report Authenticated By: Rudie Meyer, M.D.  Medications: Scheduled Meds:    . atenolol  50 mg Oral BID  . hydrochlorothiazide  12.5 mg Oral Daily  . lip balm  1 application Topical BID  . loratadine  10 mg Oral Daily  . pantoprazole  40 mg Oral BID  . potassium chloride  20 mEq Oral BID  . saccharomyces boulardii  250 mg Oral BID  . sodium chloride  3 mL Intravenous Q12H  . spironolactone  25 mg Oral Daily   Continuous Infusions:    . sodium chloride 50 mL/hr at 05/10/12 1500   PRN Meds:.acetaminophen, acetaminophen, alum & mag hydroxide-simeth, bisacodyl, hydrALAZINE, magic mouthwash, menthol-cetylpyridinium, morphine injection, ondansetron (ZOFRAN) IV, ondansetron, phenol    LOS: 10 days   [facility]  [facility] Pager [phone]. If 8PM-8AM, please contact night-coverage at [email or web address], password [facility] 05/10/2012, 4:05 PM  LOS: 10 days

## 2012-05-10 NOTE — Progress Notes (Signed)
ANTIBIOTIC CONSULT NOTE - INITIAL  Pharmacy Consult for Vanc/Zosyn Indication: Suspected Pneumonia  Allergies  Allergen Reactions  . Neosporin (Neomycin-Bacitracin Zn-Polymyx)   . Sulfa Antibiotics     Patient Measurements: Height: 5\' 4"  (162.6 cm) Weight: 149 lb 4 oz (67.7 kg) IBW/kg (Calculated) : 54.7  Adjusted Body Weight:   Vital Signs: Temp: 98.3 F (36.8 C) (01/13 1346) Temp src: Oral (01/13 1346) BP: 150/53 mmHg (01/13 1346) Pulse Rate: 76  (01/13 1346) Intake/Output from previous day: 01/12 0701 - 01/13 0700 In: 1330.4 [I.V.:1180.4; IV Piggyback:150] Out: -  Intake/Output from this shift: Total I/O In: 970 [P.O.:720; I.V.:200; Other:50] Out: -   Labs:  Basename 05/10/12 0444 05/09/12 0519 05/08/12 0541  WBC 27.5* 15.5* 17.0*  HGB 8.4* 8.8* 8.7*  PLT 256 214 191  LABCREA -- -- --  CREATININE 0.69 0.84 --   Estimated Creatinine Clearance: 45.1 ml/min (by C-G formula based on Cr of 0.69). No results found for this basename: VANCOTROUGH:2,VANCOPEAK:2,VANCORANDOM:2,GENTTROUGH:2,GENTPEAK:2,GENTRANDOM:2,TOBRATROUGH:2,TOBRAPEAK:2,TOBRARND:2,AMIKACINPEAK:2,AMIKACINTROU:2,AMIKACIN:2, in the last 72 hours   Microbiology: Recent Results (from the past 720 hour(s))  CULTURE, BLOOD (ROUTINE X 2)     Status: Normal   Collection Time   05/02/12 12:14 PM      Component Value Range Status Comment   Specimen Description BLOOD LEFT ARM   Final    Special Requests     Final    Value: BOTTLES DRAWN AEROBIC AND ANAEROBIC 10CC BOTH BOTTLES   Culture  Setup Time 05/02/2012 17:19   Final    Culture     Final    Value: CLOSTRIDIUM PERFRINGENS     Note: Gram Stain Report Called to,Read Back By and Verified With: Mignon Pine RN on 05/03/12 at 04:37 by Christie Nottingham   Report Status 05/04/2012 FINAL   Final   CULTURE, BLOOD (ROUTINE X 2)     Status: Normal   Collection Time   05/02/12 12:14 PM      Component Value Range Status Comment   Specimen Description BLOOD LEFT ARM   Final    Special Requests     Final    Value: BOTTLES DRAWN AEROBIC AND ANAEROBIC 6CC BOTH BOTTLES   Culture  Setup Time 05/02/2012 17:20   Final    Culture     Final    Value: CLOSTRIDIUM PERFRINGENS     Note: Gram Stain Report Called to,Read Back By and Verified With: Mignon Pine RN on 05/03/12 at 04:37 by Christie Nottingham   Report Status 05/04/2012 FINAL   Final   URINE CULTURE     Status: Normal   Collection Time   05/02/12  2:03 PM      Component Value Range Status Comment   Specimen Description URINE, CATHETERIZED   Final    Special Requests none   Final    Culture  Setup Time 05/02/2012 17:24   Final    Colony Count >=100,000 COLONIES/ML   Final    Culture     Final    Value: ESCHERICHIA COLI     ENTEROCOCCUS SPECIES   Report Status 05/06/2012 FINAL   Final    Organism ID, Bacteria ESCHERICHIA COLI   Final    Organism ID, Bacteria ENTEROCOCCUS SPECIES   Final   CULTURE, BLOOD (ROUTINE X 2)     Status: Normal (Preliminary result)   Collection Time   05/05/12  6:00 PM      Component Value Range Status Comment   Specimen Description BLOOD RIGHT HAND   Final  Special Requests BOTTLES DRAWN AEROBIC AND ANAEROBIC 10CC   Final    Culture  Setup Time 05/05/2012 22:35   Final    Culture     Final    Value:        BLOOD CULTURE RECEIVED NO GROWTH TO DATE CULTURE WILL BE HELD FOR 5 DAYS BEFORE ISSUING A FINAL NEGATIVE REPORT   Report Status PENDING   Incomplete   CULTURE, BLOOD (ROUTINE X 2)     Status: Normal (Preliminary result)   Collection Time   05/05/12  6:30 PM      Component Value Range Status Comment   Specimen Description BLOOD RIGHT ARM   Final    Special Requests BOTTLES DRAWN AEROBIC AND ANAEROBIC 10CC   Final    Culture  Setup Time 05/05/2012 22:35   Final    Culture     Final    Value:        BLOOD CULTURE RECEIVED NO GROWTH TO DATE CULTURE WILL BE HELD FOR 5 DAYS BEFORE ISSUING A FINAL NEGATIVE REPORT   Report Status PENDING   Incomplete   CLOSTRIDIUM DIFFICILE BY PCR     Status:  Normal   Collection Time   05/06/12  2:53 PM      Component Value Range Status Comment   C difficile by pcr NEGATIVE  NEGATIVE Final     Medical History: Past Medical History  Diagnosis Date  . Hypertension   . Irregular heart beat     Medications:  Scheduled:    . atenolol  50 mg Oral BID  . hydrochlorothiazide  12.5 mg Oral Daily  . lip balm  1 application Topical BID  . loratadine  10 mg Oral Daily  . pantoprazole  40 mg Oral BID  . piperacillin-tazobactam (ZOSYN)  IV  3.375 g Intravenous Q8H  . potassium chloride  20 mEq Oral BID  . [COMPLETED] potassium chloride  40 mEq Oral Once  . saccharomyces boulardii  250 mg Oral BID  . sodium chloride  3 mL Intravenous Q12H  . spironolactone  25 mg Oral Daily  . vancomycin  500 mg Intravenous Q12H  . [DISCONTINUED] hydrochlorothiazide  12.5 mg Oral Daily  . [DISCONTINUED] piperacillin-tazobactam (ZOSYN)  IV  3.375 g Intravenous Q8H   Infusions:    . sodium chloride 50 mL/hr at 05/10/12 1500  . [DISCONTINUED] sodium chloride Stopped (05/09/12 0856)   PRN: acetaminophen, acetaminophen, alum & mag hydroxide-simeth, bisacodyl, food thickener, hydrALAZINE, magic mouthwash, menthol-cetylpyridinium, morphine injection, ondansetron (ZOFRAN) IV, ondansetron, phenol Assessment:  79 yo F with suspected pneumonia  Has been on Zosyn x 9d, started Vanc now  Goal of Therapy:  Eradication of infection Vancomycin trough level 15-20 mcg/ml  Plan:  !. Resume Zosyn 3.375 Gm IV q 8 hours 2. Start Vancomycin 500mg  IV q 12 hours Measure antibiotic drug levels at steady state Follow up culture results  Loletta Specter 05/10/2012,4:59 PM

## 2012-05-10 NOTE — Progress Notes (Signed)
Physical Therapy Treatment Patient Details Name: [name] MRN: [id number] DOB: Nov 09, 1920 Today's Date: 05/10/2012 Time: 1478-2956 PT Time Calculation (min): 18 min  PT Assessment / Plan / Recommendation Comments on Treatment Session  Pt performed a couple sit to stands for hygiene and then assisted back to bed.  Pt reported too fatigued and weak to ambulate or perform exercises today.    Follow Up Recommendations  SNF;Supervision/Assistance - 24 hour     Does the patient have the potential to tolerate intense rehabilitation     Barriers to Discharge        Equipment Recommendations  None recommended by PT    Recommendations for Other Services    Frequency     Plan Discharge plan remains appropriate;Frequency remains appropriate    Precautions / Restrictions Precautions Precaution Comments: incontinent B/B   Pertinent Vitals/Pain Pt denies pain.    Mobility  Bed Mobility Bed Mobility: Sit to Supine Sit to Supine: 3: Mod assist;HOB flat Details for Bed Mobility Assistance: verbal cues for technique, assist for LEs onto bed Transfers Transfers: Stand to Sit;Sit to Stand;Stand Pivot Transfers Sit to Stand: 4: Min assist;From chair/3-in-1;With armrests Stand to Sit: 4: Min assist;To bed;To chair/3-in-1 Stand Pivot Transfers: 4: Min assist Details for Transfer Assistance: pt performed standing twice for hygiene, verbal cues for hand placement and assist to rise and steady, +2 for hygiene and safety with a few steps over to bed due to pt feeling very weak and observed unsteadiness with RW today Ambulation/Gait Ambulation/Gait Assistance: Not tested (comment)    Exercises     PT Diagnosis:    PT Problem List:   PT Treatment Interventions:     PT Goals Acute Rehab PT Goals Pt will go Sit to Supine/Side: with supervision PT Goal: Sit to Supine/Side - Progress: Progressing toward goal Pt will go Sit to Stand: with supervision PT Goal: Sit to Stand - Progress:  Progressing toward goal Pt will go Stand to Sit: with supervision PT Goal: Stand to Sit - Progress: Progressing toward goal Pt will Transfer Bed to Chair/Chair to Bed: with supervision PT Transfer Goal: Bed to Chair/Chair to Bed - Progress: Progressing toward goal  Visit Information  Last PT Received On: 05/10/12 Assistance Needed: +2    Subjective Data  Subjective: I don't know how I'm doing.   Cognition  Overall Cognitive Status: Appears within functional limits for tasks assessed/performed Arousal/Alertness: Awake/alert    Balance     End of Session PT - End of Session Equipment Utilized During Treatment: Gait belt Activity Tolerance: Patient limited by fatigue Patient left: in bed;with call bell/phone within reach;with bed alarm set   GP     [name],KATHrine E 05/10/2012, 4:08 PM Pager: [phone]

## 2012-05-10 NOTE — Progress Notes (Signed)
[facility]  Date of Admission:  04/30/2012  Antibiotics: Zosyn day 9  Subjective: No significant complaints, no f/c  Objective: Temp:  [98.3 F (36.8 C)-99.8 F (37.7 C)] 98.3 F (36.8 C) (01/13 1346) Pulse Rate:  [72-124] 76  (01/13 1346) Resp:  [20-22] 20  (01/13 1346) BP: (113-166)/(46-62) 150/53 mmHg (01/13 1346) SpO2:  [93 %-98 %] 95 % (01/13 1346)  General: Awake, alert, nad Skin: no rashes Lungs: CTA B Cor: RRR Abdomen: soft, distended, nt, +bs   Lab Results Lab Results  Component Value Date   WBC 27.5* 05/10/2012   HGB 8.4* 05/10/2012   HCT 26.6* 05/10/2012   MCV 84.7 05/10/2012   PLT 256 05/10/2012    Lab Results  Component Value Date   CREATININE 0.69 05/10/2012   BUN 12 05/10/2012   NA 139 05/10/2012   K 3.2* 05/10/2012   CL 107 05/10/2012   CO2 21 05/10/2012    Lab Results  Component Value Date   ALT 13 05/03/2012   AST 38* 05/03/2012   ALKPHOS 70 05/03/2012   BILITOT 0.8 05/03/2012      Microbiology: Recent Results (from the past 240 hour(s))  CULTURE, BLOOD (ROUTINE X 2)     Status: Normal   Collection Time   05/02/12 12:14 PM      Component Value Range Status Comment   Specimen Description BLOOD LEFT ARM   Final    Special Requests     Final    Value: BOTTLES DRAWN AEROBIC AND ANAEROBIC 10CC BOTH BOTTLES   Culture  Setup Time 05/02/2012 17:19   Final    Culture     Final    Value: CLOSTRIDIUM PERFRINGENS     Note: Gram Stain Report Called to,Read Back By and Verified With: Mignon Pine RN on 05/03/12 at 04:37 by Christie Nottingham   Report Status 05/04/2012 FINAL   Final   CULTURE, BLOOD (ROUTINE X 2)     Status: Normal   Collection Time   05/02/12 12:14 PM      Component Value Range Status Comment   Specimen Description BLOOD LEFT ARM   Final    Special Requests     Final    Value: BOTTLES DRAWN AEROBIC AND ANAEROBIC 6CC BOTH BOTTLES   Culture  Setup Time 05/02/2012 17:20   Final    Culture     Final    Value: CLOSTRIDIUM  PERFRINGENS     Note: Gram Stain Report Called to,Read Back By and Verified With: Mignon Pine RN on 05/03/12 at 04:37 by Christie Nottingham   Report Status 05/04/2012 FINAL   Final   URINE CULTURE     Status: Normal   Collection Time   05/02/12  2:03 PM      Component Value Range Status Comment   Specimen Description URINE, CATHETERIZED   Final    Special Requests none   Final    Culture  Setup Time 05/02/2012 17:24   Final    Colony Count >=100,000 COLONIES/ML   Final    Culture     Final    Value: ESCHERICHIA COLI     ENTEROCOCCUS SPECIES   Report Status 05/06/2012 FINAL   Final    Organism ID, Bacteria ESCHERICHIA COLI   Final    Organism ID, Bacteria ENTEROCOCCUS SPECIES   Final   CULTURE, BLOOD (ROUTINE X 2)     Status: Normal (Preliminary result)   Collection Time   05/05/12  6:00  PM      Component Value Range Status Comment   Specimen Description BLOOD RIGHT HAND   Final    Special Requests BOTTLES DRAWN AEROBIC AND ANAEROBIC 10CC   Final    Culture  Setup Time 05/05/2012 22:35   Final    Culture     Final    Value:        BLOOD CULTURE RECEIVED NO GROWTH TO DATE CULTURE WILL BE HELD FOR 5 DAYS BEFORE ISSUING A FINAL NEGATIVE REPORT   Report Status PENDING   Incomplete   CULTURE, BLOOD (ROUTINE X 2)     Status: Normal (Preliminary result)   Collection Time   05/05/12  6:30 PM      Component Value Range Status Comment   Specimen Description BLOOD RIGHT ARM   Final    Special Requests BOTTLES DRAWN AEROBIC AND ANAEROBIC 10CC   Final    Culture  Setup Time 05/05/2012 22:35   Final    Culture     Final    Value:        BLOOD CULTURE RECEIVED NO GROWTH TO DATE CULTURE WILL BE HELD FOR 5 DAYS BEFORE ISSUING A FINAL NEGATIVE REPORT   Report Status PENDING   Incomplete   CLOSTRIDIUM DIFFICILE BY PCR     Status: Normal   Collection Time   05/06/12  2:53 PM      Component Value Range Status Comment   C difficile by pcr NEGATIVE  NEGATIVE Final     Studies/Results: Dg Chest Port 1  View  05/10/2012  *RADIOLOGY REPORT*  Clinical Data: Cough.  PORTABLE CHEST - 1 VIEW  Comparison: 05/01/2012.  Findings: The cardiac silhouette, mediastinal and hilar contours are normal and stable.  There is tortuosity and calcification of the thoracic aorta.  Interval worsening lung aeration with possible asymmetric edema or bilateral infiltrates.  No pleural effusion. The bony thorax is intact  IMPRESSION: Interval worsening lung aeration possibly due to asymmetric edema or bilateral infiltrates.   Original Report Authenticated By: Rudie Meyer, M.D.     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  Still no signs of bowel necrosis since she continues to improve.  Blood cultures repeated and are negative.  Her lactate is a bit elevated, but repeat is ok.   -I am going to d/c zosyn now with repeat negative cultures and no fever or chills.    2) leukocytosis - trended down but now up again.  Consider rechecking C diff if she continues with diarrhea off of colace.  Otherwise, no infectious source identified.  Possibly pneumonia but the patient is asymptomatic at this time.  CXR noted.  I suspect more likely edema.     Staci Righter, MD [facility] [phone] pager   05/10/2012, 3:39 PM

## 2012-05-10 NOTE — Evaluation (Signed)
Clinical/Bedside Swallow Evaluation Patient Details  Name: [name] MRN: [id number] Date of Birth: 06-30-1920  Today's Date: 05/10/2012 Time: 1345-     Past Medical History:  Past Medical History  Diagnosis Date  . Hypertension   . Irregular heart beat    Past Surgical History:  Past Surgical History  Procedure Date  . Cataract extraction    HPI:  [name] is a 79 y.o. female  Yesterday afternoon she started to have abdominal pain and nausea with vomiting. Her last BM was this AM. Currently her abdominal pain improved. She has hx of chronic constipations.  No fever or chills.  No diarrhea. She have had some URI symptoms all last week. She had not have a colonoscopy done in her life.     Assessment / Plan / Recommendation Clinical Impression  Patient presents with s/s of dysphagia with suspected aspiration of thin and possibly nectar thick liquids.  Patient has a congested cough after swallowing thin liquids, and a delayed cough with nectar.  Poor awareness of deficits noted.  "I do fine when I take small sips,'' but continued to cough after small sips from the cup (again, with thin and nectar).    Aspiration Risk  Moderate    Diet Recommendation Dysphagia 3 (Mechanical Soft);Honey-thick liquid   Liquid Administration via: Cup;No straw Medication Administration: Whole meds with puree Supervision: Full supervision/cueing for compensatory strategies;Patient able to self feed Compensations: Slow rate;Small sips/bites Postural Changes and/or Swallow Maneuvers: Seated upright 90 degrees    Other  Recommendations Recommended Consults: MBS Oral Care Recommendations: Oral care QID;Staff/trained caregiver to provide oral care   Follow Up Recommendations  24 hour supervision/assistance    Frequency and Duration        Pertinent Vitals/Pain n/a    SLP Swallow Goals Patient will consume recommended diet without observed clinical signs of aspiration with: Minimal  assistance;Supervision/safety   Swallow Study Prior Functional Status       General HPI: [name] is a 79 y.o. female  Yesterday afternoon she started to have abdominal pain and nausea with vomiting. Her last BM was this AM. Currently her abdominal pain improved. She has hx of chronic constipations.  No fever or chills.  No diarrhea. She have had some URI symptoms all last week. She had not have a colonoscopy done in her life.   Type of Study: Bedside swallow evaluation Diet Prior to this Study: Regular;Thin liquids Temperature Spikes Noted: No Respiratory Status: Room air History of Recent Intubation: No Behavior/Cognition: Alert;Cooperative;Confused;Distractible;Requires cueing Oral Cavity - Dentition: Adequate natural dentition Self-Feeding Abilities: Needs assist;Needs set up Patient Positioning: Upright in chair Baseline Vocal Quality: Clear Volitional Cough: Congested Volitional Swallow: Unable to elicit    Oral/Motor/Sensory Function Overall Oral Motor/Sensory Function: Appears within functional limits for tasks assessed   Ice Chips     Thin Liquid Thin Liquid: Impaired Presentation: Spoon;Cup Pharyngeal  Phase Impairments: Suspected delayed Swallow;Cough - Immediate    Nectar Thick Nectar Thick Liquid: Impaired Presentation: Cup;Spoon Pharyngeal Phase Impairments: Throat Clearing - Immediate;Cough - Delayed   Honey Thick Honey Thick Liquid: Within functional limits Presentation: Cup   Puree Puree: Not tested Presentation: Spoon   Solid   GO    Solid: Within functional limits Presentation: Self [name], Yordan Martindale T 05/10/2012,2:16 PM

## 2012-05-10 NOTE — Progress Notes (Signed)
Nutrition Brief Note  Patient identified on the Malnutrition Screening Tool (MST) Report  Body mass index is 25.62 kg/(m^2). Patient meets criteria for overweight based on current BMI.   Current diet order is Regular, patient is consuming approximately 50-75% of meals at this time. Labs and medications reviewed.   Pt incorrectly screened positive for malnutrition risk.  Pt admitted with SBO has been able to advance diet to Regular with intake 50-75% of meals.  Pt states she is having trouble with taste changes but is eating well and tolerating diet.  Assisted in ordering lunch. No nutrition interventions warranted at this time. If nutrition issues arise, please consult RD.   Lacey Dys, MS RD LDN Clinical Inpatient Dietitian Pager: [phone] Weekend/After hours pager: [phone]

## 2012-05-10 NOTE — Progress Notes (Signed)
Report received from Tara Dark, RN. No change from initial am assessment. Will continue to follow the plan of care. 

## 2012-05-11 ENCOUNTER — Inpatient Hospital Stay (HOSPITAL_COMMUNITY): Payer: Medicare Other

## 2012-05-11 LAB — BASIC METABOLIC PANEL
BUN: 13 mg/dL (ref 6–23)
CO2: 22 mEq/L (ref 19–32)
Calcium: 8.1 mg/dL — ABNORMAL LOW (ref 8.4–10.5)
Chloride: 106 mEq/L (ref 96–112)
Creatinine, Ser: 0.68 mg/dL (ref 0.50–1.10)
GFR calc Af Amer: 87 mL/min — ABNORMAL LOW (ref >90)
GFR calc non Af Amer: 75 mL/min — ABNORMAL LOW (ref >90)
Glucose, Bld: 112 mg/dL — ABNORMAL HIGH (ref 70–99)
Potassium: 3.7 mEq/L (ref 3.5–5.1)
Sodium: 137 mEq/L (ref 135–145)

## 2012-05-11 LAB — PRO B NATRIURETIC PEPTIDE: Pro B Natriuretic peptide (BNP): 5916 pg/mL — ABNORMAL HIGH (ref 0–450)

## 2012-05-11 LAB — GLUCOSE, CAPILLARY: Glucose-Capillary: 145 mg/dL — ABNORMAL HIGH (ref 70–99)

## 2012-05-11 LAB — CBC
HCT: 27.5 % — ABNORMAL LOW (ref 36.0–46.0)
MCH: 26.9 pg (ref 26.0–34.0)
MCV: 84.1 fL (ref 78.0–100.0)
Platelets: 268 10*3/uL (ref 150–400)
RDW: 15.4 % (ref 11.5–15.5)

## 2012-05-11 LAB — CULTURE, BLOOD (ROUTINE X 2): Culture: NO GROWTH

## 2012-05-11 LAB — LACTIC ACID, PLASMA: Lactic Acid, Venous: 0.9 mmol/L (ref 0.5–2.2)

## 2012-05-11 MED ORDER — ALPRAZOLAM 0.25 MG PO TABS
0.2500 mg | ORAL_TABLET | Freq: Two times a day (BID) | ORAL | Status: DC | PRN
  Administered 2012-05-11 – 2012-05-12 (×2): 0.25 mg via ORAL
  Filled 2012-05-11 (×2): qty 1

## 2012-05-11 MED ORDER — FUROSEMIDE 10 MG/ML IJ SOLN
40.0000 mg | Freq: Two times a day (BID) | INTRAMUSCULAR | Status: AC
  Administered 2012-05-11 (×2): 40 mg via INTRAVENOUS
  Filled 2012-05-11 (×2): qty 4

## 2012-05-11 NOTE — Progress Notes (Signed)
[facility] PROGRESS NOTE  TIANNAH GREENLY OZH:[id number] DOB: 1920/11/21 DOA: 04/30/2012 PCP: No primary provider on file.  Assessment/Plan: Principal Problem:  *SBO (small bowel obstruction) Active Problems:  Hyponatremia  HTN (hypertension)  Traumatic hematoma of foot  Leukocytosis  Hypokalemia  HAP (hospital-acquired pneumonia)    1. SBO (small bowel obstruction): Patient presented with about 24 hours of abdominal pain, and vomiting. Imaging studies demonstrated small bowel obstruction with transition zone in the anterior central pelvis, associated with bowel wall thickening at the point of obstruction. This could represent an adhesion, though subtle mass lesion is not excluded. There was increased stool throughout colon, and a moderate sized hiatal hernia. Dr Girtha Hake provided surgical consultation and has recommended bowel rest and enemas, NG-T and follow up abdominal films. Clinically, patient appears to be making progress, with decreased NG-T output, and decreased bowel caliber on X-Ray.Continue management per surgeon.  NG-T dc'ed today 1/9, +BM resolved, and pt declined surgical intervention -diet advanced to regular on 1/11 and she has continued to tolerate it well 2. Hyponatremia: Sodium was 121 at presentation. This is likely secondary to dehydration, as well as pre-admission diuretic therapy. Diuretics are on hold. managing with iv NS, with improvement.  sodium has normalized at 137 on1/7.  -Follow recheck.  3. HTN (hypertension): meds changed to PO  4. Painful right foot hematoma: Patient has a hematoma on the dorsum of her right foot about 4 cm in diameter, following a recent fall. Fortunately, no bony injuries on plain films. Clinically, there is no evidence of infection at this time. This is gradually resolving.  5. Acute urinary retention: This occurred overnight on 05/02/12, and was addressed with Foley catheter. Trial of voiding.  6. Leukocytosis: Patient had a  high, persistent leukocytosis, although she is afebrile, CXR was devoid of infiltrates and urinalysis was negative. Commenced on empiric Zosyn on 05/02/12.  -Blood cultures grew GPR in identified as clostridium perfringens, appreciate ID assistance will, continue zosyn and follow repeat cultures  -Urine culture grew E. Coli.  -still increasing even with treatment as above, discussed with ID -lactic acid level elevated 1/10- with this there is concern for the possibility of necrotic bowel although pt has no symptoms c/w this at this time, her sbo is resolving. I discussed this with pt and son and she voices understanding and states that at this age she does not want to have any surgery done. Also discussed pt with Dr Biagio Quint who states since pt does not want surgery will continue conservative management -recheck lactic acid leval normalized at 1.2 on 1/11 and leukocytosis continues to decreased to15 on 1/12 -wbc back up to 27 1/13( from 15), and pt with increased cough and some dyspnea>>cxr showed bilateral infiltrates and vanc was added >>wbc 16.8 today 1/14>>discussed with ID today as below and will d/c abx and monitor 7. UTI, Ecoli/enterococcus: both sensitive to zosyn,  per ID note on  1/10 - to follow and d/c abx if Pt remaining afebrile and hemodynamically stable. 8.Clostridium perfringens Bacteremia - appreciate ID assistance, repeat cultures with no growth>>per ID 1/10 to follow and d/c abx if Pt remaining afebrile and hemodynamically stable. 8. Sore throat/Dysphonia: Patient developed a sore throat and hoarseness, after reinsertion of displaced NG-T on 05/02/11. Throat appeared clear, without hyperemia or exudate. Managed successfuly with  with Chloraseptic spray. -improved today 1/9 s/p NGT removal  9. AKI: Had jump in creatinine from 0.93 to 1.72, overnight on 05/02/12. Responded to iv fluids, and creatinine has normalized  at 0.86 as of 05/03/12- and remains wnl.   10.?dysphagia: Awaiting swallow eval  per ST 11. Diarrhea :  -appeared to decreasing follwing d/c of  Colace 1/12, if persists will recommend repeat C.diff PCR  -Patient has had C. difficile PCR which came back negative on 1/9 12. Hypernatremia Resolved, #11 was likely contributing -changed  to hypotonic ivf on 1/12,  HCTZ was also decreased(now dc'ed) 13. Hypokalemia -replace k 14.Lung infiltrates - PNA vs Pulm edema - discussed with ID/Dr Luciana Axe and he agrees that wbc of 27 on 1/13 was likely erroneous and recommends to d/c abx - will diurese with IV lasix today, follow recheck cxr in am and further diurese with lasix as appropriate - obtain echo and follow 15. Dysphagia- Moderate oral with mild pharyngeal  -dysphagia diet with Nectar-thick liquid (thin water between meals after oral care)    Code Status: DNR/DNI Family Communication: son at bedside Disposition Plan: Possible SNF in a.m. if continues to improve    Brief narrative: 79 y.o. Female with history of Hypertension, arrhythmia, previous cataract extraction, IBS and chronic constipation, presenting with abdominal pain, nausea and vomiting since 04/30/12, without o diarrhea. She had some URI symptoms all last week. She has never had a colonoscopy done in her life. Admitted for further evaluation and management.     Consultants:  Almond Lint, surgeon.   Procedures:  CXR.  Abdominal X-Ray.  X-Ray right foot.  CT Abdomen/pelvis.   Antibiotics:  Zoysn>> stopped 1/14  vanc started 1/13>>1/14   HPI/Subjective: complaining dyspnea, +cough. Per nsg diarrhea decreasing Objective: Vital signs in last 24 hours: Temp:  [98.3 F (36.8 C)-98.9 F (37.2 C)] 98.8 F (37.1 C) (01/14 0851) Pulse Rate:  [73-80] 73  (01/14 0851) Resp:  [18-20] 18  (01/14 0851) BP: (94-151)/(51-65) 151/60 mmHg (01/14 0851) SpO2:  [95 %-98 %] 98 % (01/14 0851) Weight change:  Last BM Date: 05/10/12  Intake/Output from previous day: 01/13 0701 - 01/14 0700 In: 1698.5  [P.O.:880; I.V.:568.5; IV Piggyback:200] Out: -      Physical Exam: General: Comfortable, alert, communicative, fully oriented, not short of breath at rest.   HEENT:  No clinical pallor, no jaundice, no conjunctival injection or discharge. Hydration is fair.  NECK:  Supple, JVP not seen, no carotid bruits, no palpable lymphadenopathy, no palpable goiter. CHEST:  Rhonchi gpresent in lower lung fields> in L. Lung base, no wheezes, no crackles. HEART:  Sounds 1 and 2 heard, normal, regular, no murmurs. ABDOMEN:  Mildly distended, non-tender, no palpable organomegaly, no palpable masses, normal bowel sounds.. LOWER EXTREMITIES:  No  edema, palpable peripheral pulses. Patient has a 4 cm diameter cystic , mildly tender swelling with overlying bruising, on the dorsum of her right foot, consistent with hematoma. Not clinically infected. Now visibly smaller.  CENTRAL NERVOUS SYSTEM:  No focal neurologic deficit on gross examination.  Lab Results:  Basename 05/11/12 0505 05/10/12 0444  WBC 16.8* 27.5*  HGB 8.8* 8.4*  HCT 27.5* 26.6*  PLT 268 256    Basename 05/11/12 0505 05/10/12 1649  NA 137 136  K 3.7 3.7  CL 106 104  CO2 22 21  GLUCOSE 112* 113*  BUN 13 13  CREATININE 0.68 0.75  CALCIUM 8.1* 8.3*   Recent Results (from the past 240 hour(s))  CULTURE, BLOOD (ROUTINE X 2)     Status: Normal   Collection Time   05/02/12 12:14 PM      Component Value Range Status Comment   Specimen Description  BLOOD LEFT ARM   Final    Special Requests     Final    Value: BOTTLES DRAWN AEROBIC AND ANAEROBIC 10CC BOTH BOTTLES   Culture  Setup Time 05/02/2012 17:19   Final    Culture     Final    Value: CLOSTRIDIUM PERFRINGENS     Note: Gram Stain Report Called to,Read Back By and Verified With: Mignon Pine RN on 05/03/12 at 04:37 by Christie Nottingham   Report Status 05/04/2012 FINAL   Final   CULTURE, BLOOD (ROUTINE X 2)     Status: Normal   Collection Time   05/02/12 12:14 PM      Component Value Range  Status Comment   Specimen Description BLOOD LEFT ARM   Final    Special Requests     Final    Value: BOTTLES DRAWN AEROBIC AND ANAEROBIC 6CC BOTH BOTTLES   Culture  Setup Time 05/02/2012 17:20   Final    Culture     Final    Value: CLOSTRIDIUM PERFRINGENS     Note: Gram Stain Report Called to,Read Back By and Verified With: Mignon Pine RN on 05/03/12 at 04:37 by Christie Nottingham   Report Status 05/04/2012 FINAL   Final   URINE CULTURE     Status: Normal   Collection Time   05/02/12  2:03 PM      Component Value Range Status Comment   Specimen Description URINE, CATHETERIZED   Final    Special Requests none   Final    Culture  Setup Time 05/02/2012 17:24   Final    Colony Count >=100,000 COLONIES/ML   Final    Culture     Final    Value: ESCHERICHIA COLI     ENTEROCOCCUS SPECIES   Report Status 05/06/2012 FINAL   Final    Organism ID, Bacteria ESCHERICHIA COLI   Final    Organism ID, Bacteria ENTEROCOCCUS SPECIES   Final   CULTURE, BLOOD (ROUTINE X 2)     Status: Normal   Collection Time   05/05/12  6:00 PM      Component Value Range Status Comment   Specimen Description BLOOD RIGHT HAND   Final    Special Requests BOTTLES DRAWN AEROBIC AND ANAEROBIC 10CC   Final    Culture  Setup Time 05/05/2012 22:35   Final    Culture NO GROWTH 5 DAYS   Final    Report Status 05/11/2012 FINAL   Final   CULTURE, BLOOD (ROUTINE X 2)     Status: Normal   Collection Time   05/05/12  6:30 PM      Component Value Range Status Comment   Specimen Description BLOOD RIGHT ARM   Final    Special Requests BOTTLES DRAWN AEROBIC AND ANAEROBIC 10CC   Final    Culture  Setup Time 05/05/2012 22:35   Final    Culture NO GROWTH 5 DAYS   Final    Report Status 05/11/2012 FINAL   Final   CLOSTRIDIUM DIFFICILE BY PCR     Status: Normal   Collection Time   05/06/12  2:53 PM      Component Value Range Status Comment   C difficile by pcr NEGATIVE  NEGATIVE Final      Studies/Results: Dg Chest Port 1 View  05/10/2012   *RADIOLOGY REPORT*  Clinical Data: Cough.  PORTABLE CHEST - 1 VIEW  Comparison: 05/01/2012.  Findings: The cardiac silhouette, mediastinal and hilar contours are normal and stable.  There is tortuosity and calcification of the thoracic aorta.  Interval worsening lung aeration with possible asymmetric edema or bilateral infiltrates.  No pleural effusion. The bony thorax is intact  IMPRESSION: Interval worsening lung aeration possibly due to asymmetric edema or bilateral infiltrates.   Original Report Authenticated By: Rudie Meyer, M.D.     Medications: Scheduled Meds:    . atenolol  50 mg Oral BID  . furosemide  40 mg Intravenous Q12H  . lip balm  1 application Topical BID  . loratadine  10 mg Oral Daily  . pantoprazole  40 mg Oral BID  . potassium chloride  20 mEq Oral BID  . saccharomyces boulardii  250 mg Oral BID  . sodium chloride  3 mL Intravenous Q12H  . spironolactone  25 mg Oral Daily   Continuous Infusions:    . sodium chloride 20 mL/hr (05/10/12 1717)   PRN Meds:.acetaminophen, acetaminophen, alum & mag hydroxide-simeth, bisacodyl, food thickener, hydrALAZINE, magic mouthwash, menthol-cetylpyridinium, morphine injection, ondansetron (ZOFRAN) IV, ondansetron, phenol    LOS: 11 days   Inis Borneman C  [facility] Pager [phone]. If 8PM-8AM, please contact night-coverage at [email or web address], password [facility] 05/11/2012, 9:50 AM  LOS: 11 days

## 2012-05-11 NOTE — Procedures (Deleted)
Objective Swallowing Evaluation: Modified Barium Swallowing Study  Patient Details  Name: [name] MRN: [id number] Date of Birth: [birth year]  Today's Date: 05/11/2012 Time: 1025-1049 SLP Time Calculation (min): 24 min  Past Medical History:  Past Medical History  Diagnosis Date  . Hypertension   . Irregular heart beat    Past Surgical History:  Past Surgical History  Procedure Date  . Cataract extraction    HPI:  [name] is a 79 y.o. female  pt is s/p NG for decompression/SBO.  Pt with c/o N/V 2 days prior.  Per RN today, pt has been incontinent of bowel and bladder - is having bowel movevement.   Pt has hx of chronic constipations.  No fever or chills.  No diarrhea. She have had some URI symptoms all last week.  Swallow evaluation was ordered and BSE completed yesterday with concerns for pt aspirating.  Diet was modified to dys3/honey and MBS ordered.  CXR 1/13 showed worsening lung aeration due to asymmetric edema or bilateral infiltrates.  Pt for repeat CXR today.      Assessment / Plan / Recommendation Clinical Impression  Dysphagia Diagnosis: Moderate oral phase dysphagia;Mild pharyngeal phase dysphagia Clinical impression: Pt presents with moderate oral and mild pharyngeal phase dysphagia with single episode of mild aspiration  with thin liquid (with delayed reflexive cough.)  Trace aspiration occured due to oral residue that spilled into pharynx and accumulated at pyriform sinus without pt awareness- therefore when pt spilled barium into larynx/trachea with CUED swallow.   Further boluses (x9) were not aspirated or deeply penetrated.  Unfortunately pt coughed frequently during testing without barium visualized in larynx/trachea- making clinical follow up difficult.  Delayed oral transit, decr oral coordination resulting in oral residuals with all boluses.  Consuming intake appears effortful for this pt, evidenced by pt producing a heavy sigh with cracker retained in  her mouth.   Due to aspiration episode without clearance, rec consider advancing diet to soft/nectar while acutely ill.  Also recommend allow pt thin water between meals (after oral care) for hydration.  Anticipate pt's swallow to improve consistent with medical  progression.    SLP to follow for education/tolerance.  Thanks for the order.      Treatment Recommendation  Therapy as outlined in treatment plan below    Diet Recommendation Dysphagia 3 (Mechanical Soft);Nectar-thick liquid (thin water between meals after oral care)   Liquid Administration via: Cup;Straw Medication Administration: Whole meds with puree Supervision: Full supervision/cueing for compensatory strategies;Patient able to self feed Compensations: Slow rate;Small sips/bites (intermittent dry swallow- follow solids with liquids)    Other  Recommendations Oral Care Recommendations: Oral care BID   Follow Up Recommendations  24 hour supervision/assistance    Frequency and Duration min 2x/week  2 weeks   Pertinent Vitals/Pain Afebrile, decreased- productive cough to clear secretions    SLP Swallow Goals Patient will consume recommended diet without observed clinical signs of aspiration with:  (discontinued:  pt with overt coughing without po) Patient will utilize recommended strategies during swallow to increase swallowing safety with: Moderate cueing   General Date of Onset: 05/11/12 HPI: [name] is a 79 y.o. female  pt is s/p NG for decompression/SBO.  Pt with c/o N/V 2 days prior.  Per RN today, pt has been incontinent of bowel and bladder - is having bowel movevement.   Pt has hx of chronic constipations.  No fever or chills.  No diarrhea. She have had some URI symptoms all  last week.  Swallow evaluation was ordered and BSE completed yesterday with concerns for pt aspirating.  Diet was modified to dys3/honey and MBS ordered.  CXR 1/13 showed worsening lung aeration due to asymmetric edema or bilateral  infiltrates.  Pt for repeat CXR today.  Type of Study: Modified Barium Swallowing Study Reason for Referral: Objectively evaluate swallowing function Diet Prior to this Study: Dysphagia 3 (soft);Honey-thick liquids Temperature Spikes Noted: No Respiratory Status: Room air History of Recent Intubation: No Behavior/Cognition: Alert;Cooperative;Requires cueing;Distractible Oral Cavity - Dentition: Adequate natural dentition Self-Feeding Abilities: Needs assist;Needs set up Patient Positioning: Upright in chair Baseline Vocal Quality: Clear Volitional Cough: Congested (productive) Volitional Swallow: Able to elicit Anatomy: Within functional limits Pharyngeal Secretions: Not observed secondary MBS    Reason for Referral Objectively evaluate swallowing function   Oral Phase Oral Preparation/Oral Phase Oral Phase: Impaired Oral - Nectar Oral - Nectar Teaspoon: Reduced posterior propulsion;Delayed oral transit;Piecemeal swallowing;Weak lingual manipulation Oral - Nectar Cup: Reduced posterior propulsion;Piecemeal swallowing;Delayed oral transit;Weak lingual manipulation Oral - Thin Oral - Thin Teaspoon: Reduced posterior propulsion;Delayed oral transit;Lingual/palatal residue;Piecemeal swallowing;Weak lingual manipulation Oral - Thin Cup: Reduced posterior propulsion;Delayed oral transit;Lingual/palatal residue;Piecemeal swallowing;Weak lingual manipulation Oral - Thin Straw: Reduced posterior propulsion;Delayed oral transit;Piecemeal swallowing;Lingual/palatal residue;Weak lingual manipulation Oral - Solids Oral - Puree: Reduced posterior propulsion;Lingual/palatal residue;Piecemeal swallowing;Delayed oral transit;Weak lingual manipulation;Pocketing in anterior sulcus Oral - Regular: Reduced posterior propulsion;Lingual/palatal residue;Piecemeal swallowing;Delayed oral transit;Impaired mastication;Weak lingual manipulation;Pocketing in anterior sulcus Oral Phase - Comment Oral Phase -  Comment: pt with pocketing in anterior region with solids without adequate awareness, dry and liquid swallows aid oral clearance, piecemeal deglutiton noted as oral residuals spill into pharynx after the swallow   Pharyngeal Phase Pharyngeal Phase Pharyngeal Phase: Impaired Pharyngeal - Nectar Pharyngeal - Nectar Teaspoon: Premature spillage to valleculae Pharyngeal - Nectar Cup: Premature spillage to valleculae Pharyngeal - Thin Pharyngeal - Thin Teaspoon: Premature spillage to valleculae;Reduced anterior laryngeal mobility;Reduced laryngeal elevation;Reduced airway/laryngeal closure Pharyngeal - Thin Cup: Premature spillage to pyriform sinuses;Trace aspiration;Penetration/Aspiration after swallow;Pharyngeal residue - pyriform sinuses;Penetration/Aspiration during swallow;Reduced anterior laryngeal mobility;Reduced laryngeal elevation;Reduced airway/laryngeal closure (mild asp-trace penetration during the swallow) Penetration/Aspiration details (thin cup): Material enters airway, passes BELOW cords and not ejected out despite cough attempt by patient Pharyngeal - Thin Straw: Premature spillage to valleculae Pharyngeal - Solids Pharyngeal - Puree: Delayed swallow initiation;Premature spillage to valleculae Pharyngeal - Regular: Delayed swallow initiation;Premature spillage to valleculae Pharyngeal Phase - Comment Pharyngeal Comment: mild delayed audible aspiration x1 with delayed cough due to oral residue of thin barium accumulating at pyriform sinus without swallow trigger, cued delayed swallow resulted in mild aspiration  Cervical Esophageal Phase    GO    Cervical Esophageal Phase Cervical Esophageal Phase: Impaired Cervical Esophageal Phase - Comment Cervical Esophageal Comment: appearance of prominent cricopharyngeus observed that did not impair barium flow, appeared of delayed distal clearance of liquid barium without pt awareness- that may be consistent with mild motility issues, at  completion of MBS esophagus appeared clear, radiologist not present to confirm        Donavan Burnet, MS [facility] SLP [phone]

## 2012-05-11 NOTE — Progress Notes (Signed)
[facility]  Date of Admission:  04/30/2012  Antibiotics: Now off antibiotics  Subjective: No significant complaints, no f/c  Objective: Temp:  [98.5 F (36.9 C)-98.9 F (37.2 C)] 98.5 F (36.9 C) (01/14 1441) Pulse Rate:  [73-80] 77  (01/14 1441) Resp:  [18-20] 20  (01/14 1441) BP: (94-151)/(51-65) 128/60 mmHg (01/14 1441) SpO2:  [97 %-98 %] 97 % (01/14 1441)  General: Awake, alert, nad Skin: no rashes Lungs: CTA B Cor: RRR Abdomen: soft, distended, nt, +bs   Lab Results Lab Results  Component Value Date   WBC 16.8* 05/11/2012   HGB 8.8* 05/11/2012   HCT 27.5* 05/11/2012   MCV 84.1 05/11/2012   PLT 268 05/11/2012    Lab Results  Component Value Date   CREATININE 0.68 05/11/2012   BUN 13 05/11/2012   NA 137 05/11/2012   K 3.7 05/11/2012   CL 106 05/11/2012   CO2 22 05/11/2012    Lab Results  Component Value Date   ALT 13 05/03/2012   AST 38* 05/03/2012   ALKPHOS 70 05/03/2012   BILITOT 0.8 05/03/2012      Microbiology: Recent Results (from the past 240 hour(s))  CULTURE, BLOOD (ROUTINE X 2)     Status: Normal   Collection Time   05/02/12 12:14 PM      Component Value Range Status Comment   Specimen Description BLOOD LEFT ARM   Final    Special Requests     Final    Value: BOTTLES DRAWN AEROBIC AND ANAEROBIC 10CC BOTH BOTTLES   Culture  Setup Time 05/02/2012 17:19   Final    Culture     Final    Value: CLOSTRIDIUM PERFRINGENS     Note: Gram Stain Report Called to,Read Back By and Verified With: Mignon Pine RN on 05/03/12 at 04:37 by Christie Nottingham   Report Status 05/04/2012 FINAL   Final   CULTURE, BLOOD (ROUTINE X 2)     Status: Normal   Collection Time   05/02/12 12:14 PM      Component Value Range Status Comment   Specimen Description BLOOD LEFT ARM   Final    Special Requests     Final    Value: BOTTLES DRAWN AEROBIC AND ANAEROBIC 6CC BOTH BOTTLES   Culture  Setup Time 05/02/2012 17:20   Final    Culture     Final    Value: CLOSTRIDIUM  PERFRINGENS     Note: Gram Stain Report Called to,Read Back By and Verified With: Mignon Pine RN on 05/03/12 at 04:37 by Christie Nottingham   Report Status 05/04/2012 FINAL   Final   URINE CULTURE     Status: Normal   Collection Time   05/02/12  2:03 PM      Component Value Range Status Comment   Specimen Description URINE, CATHETERIZED   Final    Special Requests none   Final    Culture  Setup Time 05/02/2012 17:24   Final    Colony Count >=100,000 COLONIES/ML   Final    Culture     Final    Value: ESCHERICHIA COLI     ENTEROCOCCUS SPECIES   Report Status 05/06/2012 FINAL   Final    Organism ID, Bacteria ESCHERICHIA COLI   Final    Organism ID, Bacteria ENTEROCOCCUS SPECIES   Final   CULTURE, BLOOD (ROUTINE X 2)     Status: Normal   Collection Time   05/05/12  6:00 PM  Component Value Range Status Comment   Specimen Description BLOOD RIGHT HAND   Final    Special Requests BOTTLES DRAWN AEROBIC AND ANAEROBIC 10CC   Final    Culture  Setup Time 05/05/2012 22:35   Final    Culture NO GROWTH 5 DAYS   Final    Report Status 05/11/2012 FINAL   Final   CULTURE, BLOOD (ROUTINE X 2)     Status: Normal   Collection Time   05/05/12  6:30 PM      Component Value Range Status Comment   Specimen Description BLOOD RIGHT ARM   Final    Special Requests BOTTLES DRAWN AEROBIC AND ANAEROBIC 10CC   Final    Culture  Setup Time 05/05/2012 22:35   Final    Culture NO GROWTH 5 DAYS   Final    Report Status 05/11/2012 FINAL   Final   CLOSTRIDIUM DIFFICILE BY PCR     Status: Normal   Collection Time   05/06/12  2:53 PM      Component Value Range Status Comment   C difficile by pcr NEGATIVE  NEGATIVE Final     Studies/Results: Dg Chest Port 1 View  05/11/2012  Chales Abrahams, CCC-SLP     05/11/2012 11:44 AM Objective Swallowing Evaluation: Modified Barium Swallowing Study   Patient Details  Name: [name] MRN: [id number] Date of Birth: 25-Nov-1920  Today's Date: 05/11/2012 Time: 1025-1049 SLP Time  Calculation (min): 24 min  Past Medical History:  Past Medical History  Diagnosis Date  . Hypertension   . Irregular heart beat    Past Surgical History:  Past Surgical History  Procedure Date  . Cataract extraction    HPI:  [name] is a 79 y.o. female  pt is s/p NG for  decompression/SBO.  Pt with c/o N/V 2 days prior.  Per RN today,  pt has been incontinent of bowel and bladder - is having bowel  movevement.   Pt has hx of chronic constipations.  No fever or  chills.  No diarrhea. She have had some URI symptoms all last  week.  Swallow evaluation was ordered and BSE completed yesterday  with concerns for pt aspirating.  Diet was modified to dys3/honey  and MBS ordered.  CXR 1/13 showed worsening lung aeration due to  asymmetric edema or bilateral infiltrates.  Pt for repeat CXR  today.      Assessment / Plan / Recommendation Clinical Impression  Dysphagia Diagnosis: Moderate oral phase dysphagia;Mild  pharyngeal phase dysphagia Clinical impression: Pt presents with moderate oral and mild  pharyngeal phase dysphagia with single episode of mild aspiration   with thin liquid (with delayed reflexive cough.)  Trace  aspiration occured due to oral residue that spilled into pharynx  and accumulated at pyriform sinus without pt awareness- therefore  when pt spilled barium into larynx/trachea with CUED swallow.    Further boluses (x9) were not aspirated or deeply penetrated.   Unfortunately pt coughed frequently during testing without barium  visualized in larynx/trachea- making clinical follow up  difficult.  Delayed oral transit, decr oral coordination  resulting in oral residuals with all boluses.  Consuming intake  appears effortful for this pt, evidenced by pt producing a heavy  sigh with cracker retained in her mouth.   Due to aspiration  episode without clearance, rec consider advancing diet to  soft/nectar while acutely ill.  Also recommend allow pt thin  water between meals (after oral care) for  hydration.  Anticipate  pt's swallow to improve consistent with medical  progression.     SLP to follow for education/tolerance.  Thanks for the order.      Treatment Recommendation  Therapy as outlined in treatment plan below    Diet Recommendation Dysphagia 3 (Mechanical Soft);Nectar-thick  liquid (thin water between meals after oral care)   Liquid Administration via: Cup;Straw Medication Administration: Whole meds with puree Supervision: Full supervision/cueing for compensatory  strategies;Patient able to self feed Compensations: Slow rate;Small sips/bites (intermittent dry  swallow- follow solids with liquids)    Other  Recommendations Oral Care Recommendations: Oral care BID   Follow Up Recommendations  24 hour supervision/assistance    Frequency and Duration min 2x/week  2 weeks   Pertinent Vitals/Pain Afebrile, decreased- productive cough to  clear secretions    SLP Swallow Goals Patient will consume recommended diet without observed clinical  signs of aspiration with:  (discontinued:  pt with overt coughing  without po) Patient will utilize recommended strategies during swallow to  increase swallowing safety with: Moderate cueing   General Date of Onset: 05/11/12 HPI: [name] is a 79 y.o. female  pt is s/p NG for  decompression/SBO.  Pt with c/o N/V 2 days prior.  Per RN today,  pt has been incontinent of bowel and bladder - is having bowel  movevement.   Pt has hx of chronic constipations.  No fever or  chills.  No diarrhea. She have had some URI symptoms all last  week.  Swallow evaluation was ordered and BSE completed yesterday  with concerns for pt aspirating.  Diet was modified to dys3/honey  and MBS ordered.  CXR 1/13 showed worsening lung aeration due to  asymmetric edema or bilateral infiltrates.  Pt for repeat CXR  today.  Type of Study: Modified Barium Swallowing Study Reason for Referral: Objectively evaluate swallowing function Diet Prior to this Study: Dysphagia 3 (soft);Honey-thick  liquids Temperature Spikes Noted: No Respiratory Status: Room air History of Recent Intubation: No Behavior/Cognition: Alert;Cooperative;Requires  cueing;Distractible Oral Cavity - Dentition: Adequate natural dentition Self-Feeding Abilities: Needs assist;Needs set up Patient Positioning: Upright in chair Baseline Vocal Quality: Clear Volitional Cough: Congested (productive) Volitional Swallow: Able to elicit Anatomy: Within functional limits Pharyngeal Secretions: Not observed secondary MBS    Reason for Referral Objectively evaluate swallowing function   Oral Phase Oral Preparation/Oral Phase Oral Phase: Impaired Oral - Nectar Oral - Nectar Teaspoon: Reduced posterior propulsion;Delayed oral  transit;Piecemeal swallowing;Weak lingual manipulation Oral - Nectar Cup: Reduced posterior propulsion;Piecemeal  swallowing;Delayed oral transit;Weak lingual manipulation Oral - Thin Oral - Thin Teaspoon: Reduced posterior propulsion;Delayed oral  transit;Lingual/palatal residue;Piecemeal swallowing;Weak lingual  manipulation Oral - Thin Cup: Reduced posterior propulsion;Delayed oral  transit;Lingual/palatal residue;Piecemeal swallowing;Weak lingual  manipulation Oral - Thin Straw: Reduced posterior propulsion;Delayed oral  transit;Piecemeal swallowing;Lingual/palatal residue;Weak lingual  manipulation Oral - Solids Oral - Puree: Reduced posterior propulsion;Lingual/palatal  residue;Piecemeal swallowing;Delayed oral transit;Weak lingual  manipulation;Pocketing in anterior sulcus Oral - Regular: Reduced posterior propulsion;Lingual/palatal  residue;Piecemeal swallowing;Delayed oral transit;Impaired  mastication;Weak lingual manipulation;Pocketing in anterior  sulcus Oral Phase - Comment Oral Phase - Comment: pt with pocketing in anterior region with  solids without adequate awareness, dry and liquid swallows aid  oral clearance, piecemeal deglutiton noted as oral residuals  spill into pharynx after the swallow   Pharyngeal  Phase Pharyngeal Phase Pharyngeal Phase: Impaired Pharyngeal - Nectar Pharyngeal - Nectar Teaspoon: Premature spillage to valleculae Pharyngeal - Nectar Cup: Premature spillage to valleculae Pharyngeal - Thin  Pharyngeal - Thin Teaspoon: Premature spillage to  valleculae;Reduced anterior laryngeal mobility;Reduced laryngeal  elevation;Reduced airway/laryngeal closure Pharyngeal - Thin Cup: Premature spillage to pyriform  sinuses;Trace aspiration;Penetration/Aspiration after  swallow;Pharyngeal residue - pyriform  sinuses;Penetration/Aspiration during swallow;Reduced anterior  laryngeal mobility;Reduced laryngeal elevation;Reduced  airway/laryngeal closure (mild asp-trace penetration during the  swallow) Penetration/Aspiration details (thin cup): Material enters  airway, passes BELOW cords and not ejected out despite cough  attempt by patient Pharyngeal - Thin Straw: Premature spillage to valleculae Pharyngeal - Solids Pharyngeal - Puree: Delayed swallow initiation;Premature spillage  to valleculae Pharyngeal - Regular: Delayed swallow initiation;Premature  spillage to valleculae Pharyngeal Phase - Comment Pharyngeal Comment: mild delayed audible aspiration x1 with  delayed cough due to oral residue of thin barium accumulating at  pyriform sinus without swallow trigger, cued delayed swallow  resulted in mild aspiration  Cervical Esophageal Phase    GO    Cervical Esophageal Phase Cervical Esophageal Phase: Impaired Cervical Esophageal Phase - Comment Cervical Esophageal Comment: appearance of prominent  cricopharyngeus observed that did not impair barium flow,  appeared of delayed distal clearance of liquid barium without pt  awareness- that may be consistent with mild motility issues, at  completion of MBS esophagus appeared clear, radiologist not  present to confirm        Donavan Burnet, MS [facility] SLP [phone]     Dg Chest Port 1 View  05/10/2012  *RADIOLOGY REPORT*  Clinical Data: Cough.  PORTABLE CHEST - 1 VIEW   Comparison: 05/01/2012.  Findings: The cardiac silhouette, mediastinal and hilar contours are normal and stable.  There is tortuosity and calcification of the thoracic aorta.  Interval worsening lung aeration with possible asymmetric edema or bilateral infiltrates.  No pleural effusion. The bony thorax is intact  IMPRESSION: Interval worsening lung aeration possibly due to asymmetric edema or bilateral infiltrates.   Original Report Authenticated By: Rudie Meyer, M.D.     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  Stable.  2) leukocytosis - it is again back down.  I suspect transient increase yesterday.  With her aspiration, she may have an elevation of her WBC from pneumonitis (non-infectious) vs pneumonia.  She is off antibiotics now.  -continue to observe her off antibiotics.     Staci Righter, MD [facility] [phone] pager   05/11/2012, 4:39 PM

## 2012-05-11 NOTE — Progress Notes (Signed)
Echocardiogram 2D Echocardiogram has been performed.  [name] 05/11/2012, 11:50 AM

## 2012-05-11 NOTE — Progress Notes (Signed)
CSW continues to follow for snf placement.  Tynika Luddy C. Alyssia Heese MSW, LCSW [phone]  

## 2012-05-12 ENCOUNTER — Inpatient Hospital Stay (HOSPITAL_COMMUNITY): Payer: Medicare Other

## 2012-05-12 LAB — BASIC METABOLIC PANEL
Chloride: 105 mEq/L (ref 96–112)
GFR calc Af Amer: 72 mL/min — ABNORMAL LOW (ref >90)
GFR calc non Af Amer: 63 mL/min — ABNORMAL LOW (ref >90)
Potassium: 3.3 mEq/L — ABNORMAL LOW (ref 3.5–5.1)
Sodium: 139 mEq/L (ref 135–145)

## 2012-05-12 LAB — CBC
HCT: 31.7 % — ABNORMAL LOW (ref 36.0–46.0)
MCHC: 31.5 g/dL (ref 30.0–36.0)
Platelets: 323 10*3/uL (ref 150–400)
RDW: 15.6 % — ABNORMAL HIGH (ref 11.5–15.5)
WBC: 16.5 10*3/uL — ABNORMAL HIGH (ref 4.0–10.5)

## 2012-05-12 LAB — GLUCOSE, CAPILLARY: Glucose-Capillary: 128 mg/dL — ABNORMAL HIGH (ref 70–99)

## 2012-05-12 MED ORDER — BENZONATATE 100 MG PO CAPS
100.0000 mg | ORAL_CAPSULE | Freq: Three times a day (TID) | ORAL | Status: DC
  Administered 2012-05-12 – 2012-05-17 (×13): 100 mg via ORAL
  Filled 2012-05-12 (×15): qty 1

## 2012-05-12 MED ORDER — SODIUM CHLORIDE 0.9 % IV BOLUS (SEPSIS)
250.0000 mL | Freq: Once | INTRAVENOUS | Status: AC
  Administered 2012-05-12: 250 mL via INTRAVENOUS

## 2012-05-12 MED ORDER — SACCHAROMYCES BOULARDII 250 MG PO CAPS: 250.0000 mg | ORAL_CAPSULE | Freq: Two times a day (BID) | ORAL | Status: DC

## 2012-05-12 MED ORDER — POTASSIUM CHLORIDE CRYS ER 20 MEQ PO TBCR
40.0000 meq | EXTENDED_RELEASE_TABLET | Freq: Once | ORAL | Status: AC
  Administered 2012-05-12: 40 meq via ORAL
  Filled 2012-05-12: qty 2

## 2012-05-12 MED ORDER — FUROSEMIDE 10 MG/ML IJ SOLN: 40.0000 mg | Freq: Once | INTRAMUSCULAR | Status: DC

## 2012-05-12 NOTE — Progress Notes (Signed)
Pt heavy 2 person assist from chair to bed. Upon standing pt stooled down her legs and onto floor. Stool was brown and loose.

## 2012-05-12 NOTE — Progress Notes (Addendum)
Patient Details  Name: [name] MRN: [id number] Date of Birth: 1920/04/29  Modified Barium Swallow  Today's Date: 05/11/2012 Time: 1025-1049 SLP Time Calculation (min): 24 min  Past Medical History:  Past Medical History  Diagnosis Date  . Hypertension   . Irregular heart beat    Past Surgical History:  Past Surgical History  Procedure Date  . Cataract extraction    HPI:  [name] is a 79 y.o. female  pt is s/p NG for decompression/SBO.  Pt with c/o N/V 2 days prior.  Per RN today, pt has been incontinent of bowel and bladder - is having bowel movevement.   Pt has hx of chronic constipations.  No fever or chills.  No diarrhea. She have had some URI symptoms all last week.  Swallow evaluation was ordered and BSE completed yesterday with concerns for pt aspirating.  Diet was modified to dys3/honey and MBS ordered.  CXR 1/13 showed worsening lung aeration due to asymmetric edema or bilateral infiltrates.  Pt for repeat CXR today.      Assessment / Plan / Recommendation Clinical Impression  Dysphagia Diagnosis: Moderate oral phase dysphagia;Mild pharyngeal phase dysphagia Clinical impression: Pt presents with moderate oral and mild pharyngeal phase dysphagia with single episode of mild aspiration  with thin liquid (with delayed reflexive cough.)  Trace aspiration occured due to oral residue that spilled into pharynx and accumulated at pyriform sinus without pt awareness- therefore when pt spilled barium into larynx/trachea with CUED swallow.   Further boluses (x9) were not aspirated or deeply penetrated.  Unfortunately pt coughed frequently during testing without barium visualized in larynx/trachea- making clinical follow up difficult.  Delayed oral transit, decr oral coordination resulting in oral residuals with all boluses.  Consuming intake appears effortful for this pt, evidenced by pt producing a heavy sigh with cracker retained in her mouth.   Due to aspiration episode  without clearance, rec consider advancing diet to soft/nectar while acutely ill.  Also recommend allow pt thin water between meals (after oral care) for hydration.  Anticipate pt's swallow to improve consistent with medical  progression.    SLP to follow for education/tolerance.  Thanks for the order.      Treatment Recommendation  Therapy as outlined in treatment plan below    Diet Recommendation Dysphagia 3 (Mechanical Soft);Nectar-thick liquid (thin water between meals after oral care)   Liquid Administration via: Cup;Straw Medication Administration: Whole meds with puree Supervision: Full supervision/cueing for compensatory strategies;Patient able to self feed Compensations: Slow rate;Small sips/bites (intermittent dry swallow- follow solids with liquids)    Other  Recommendations Oral Care Recommendations: Oral care BID   Follow Up Recommendations  24 hour supervision/assistance    Frequency and Duration min 2x/week  2 weeks   Pertinent Vitals/Pain Afebrile, decreased- productive cough to clear secretions    SLP Swallow Goals Patient will consume recommended diet without observed clinical signs of aspiration with:  (discontinued:  pt with overt coughing without po) Patient will utilize recommended strategies during swallow to increase swallowing safety with: Moderate cueing   General Date of Onset: 05/11/12 HPI: [name] is a 79 y.o. female  pt is s/p NG for decompression/SBO.  Pt with c/o N/V 2 days prior.  Per RN today, pt has been incontinent of bowel and bladder - is having bowel movevement.   Pt has hx of chronic constipations.  No fever or chills.  No diarrhea. She have had some URI symptoms all last week.  Swallow  evaluation was ordered and BSE completed yesterday with concerns for pt aspirating.  Diet was modified to dys3/honey and MBS ordered.  CXR 1/13 showed worsening lung aeration due to asymmetric edema or bilateral infiltrates.  Pt for repeat CXR today.  Type  of Study: Modified Barium Swallowing Study Reason for Referral: Objectively evaluate swallowing function Diet Prior to this Study: Dysphagia 3 (soft);Honey-thick liquids Temperature Spikes Noted: No Respiratory Status: Room air History of Recent Intubation: No Behavior/Cognition: Alert;Cooperative;Requires cueing;Distractible Oral Cavity - Dentition: Adequate natural dentition Self-Feeding Abilities: Needs assist;Needs set up Patient Positioning: Upright in chair Baseline Vocal Quality: Clear Volitional Cough: Congested (productive) Volitional Swallow: Able to elicit Anatomy: Within functional limits Pharyngeal Secretions: Not observed secondary MBS    Reason for Referral Objectively evaluate swallowing function   Oral Phase Oral Preparation/Oral Phase Oral Phase: Impaired Oral - Nectar Oral - Nectar Teaspoon: Reduced posterior propulsion;Delayed oral transit;Piecemeal swallowing;Weak lingual manipulation Oral - Nectar Cup: Reduced posterior propulsion;Piecemeal swallowing;Delayed oral transit;Weak lingual manipulation Oral - Thin Oral - Thin Teaspoon: Reduced posterior propulsion;Delayed oral transit;Lingual/palatal residue;Piecemeal swallowing;Weak lingual manipulation Oral - Thin Cup: Reduced posterior propulsion;Delayed oral transit;Lingual/palatal residue;Piecemeal swallowing;Weak lingual manipulation Oral - Thin Straw: Reduced posterior propulsion;Delayed oral transit;Piecemeal swallowing;Lingual/palatal residue;Weak lingual manipulation Oral - Solids Oral - Puree: Reduced posterior propulsion;Lingual/palatal residue;Piecemeal swallowing;Delayed oral transit;Weak lingual manipulation;Pocketing in anterior sulcus Oral - Regular: Reduced posterior propulsion;Lingual/palatal residue;Piecemeal swallowing;Delayed oral transit;Impaired mastication;Weak lingual manipulation;Pocketing in anterior sulcus Oral Phase - Comment Oral Phase - Comment: pt with pocketing in anterior region with  solids without adequate awareness, dry and liquid swallows aid oral clearance, piecemeal deglutiton noted as oral residuals spill into pharynx after the swallow   Pharyngeal Phase Pharyngeal Phase Pharyngeal Phase: Impaired Pharyngeal - Nectar Pharyngeal - Nectar Teaspoon: Premature spillage to valleculae Pharyngeal - Nectar Cup: Premature spillage to valleculae Pharyngeal - Thin Pharyngeal - Thin Teaspoon: Premature spillage to valleculae;Reduced anterior laryngeal mobility;Reduced laryngeal elevation;Reduced airway/laryngeal closure Pharyngeal - Thin Cup: Premature spillage to pyriform sinuses;Trace aspiration;Penetration/Aspiration after swallow;Pharyngeal residue - pyriform sinuses;Penetration/Aspiration during swallow;Reduced anterior laryngeal mobility;Reduced laryngeal elevation;Reduced airway/laryngeal closure (mild asp-trace penetration during the swallow) Penetration/Aspiration details (thin cup): Material enters airway, passes BELOW cords and not ejected out despite cough attempt by patient Pharyngeal - Thin Straw: Premature spillage to valleculae Pharyngeal - Solids Pharyngeal - Puree: Delayed swallow initiation;Premature spillage to valleculae Pharyngeal - Regular: Delayed swallow initiation;Premature spillage to valleculae Pharyngeal Phase - Comment Pharyngeal Comment: mild delayed audible aspiration x1 with delayed cough due to oral residue of thin barium accumulating at pyriform sinus without swallow trigger, cued delayed swallow resulted in mild aspiration  Cervical Esophageal Phase    GO    Cervical Esophageal Phase Cervical Esophageal Phase: Impaired Cervical Esophageal Phase - Comment Cervical Esophageal Comment: appearance of prominent cricopharyngeus observed that did not impair barium flow, appeared of delayed distal clearance of liquid barium without pt awareness- that may be consistent with mild motility issues, at completion of MBS esophagus appeared clear,  radiologist not present to confirm        Donavan Burnet, MS [facility] SLP [phone]

## 2012-05-12 NOTE — Progress Notes (Signed)
[facility] PROGRESS NOTE  [name] WUX:[id number] DOB: May 10, 1920 DOA: 04/30/2012 PCP: No primary provider on file.  Assessment/Plan:  1. SBO (small bowel obstruction): Patient presented with about 24 hours of abdominal pain, and vomiting. Imaging studies demonstrated small bowel obstruction with transition zone in the anterior central pelvis, associated with bowel wall thickening at the point of obstruction. This could represent an adhesion, though subtle mass lesion is not excluded. There was increased stool throughout colon, and a moderate sized hiatal hernia. Dr Girtha Hake provided surgical consultation and has recommended bowel rest and enemas, NG-T and follow up abdominal films. Clinically, patient appears to be making progress, with decreased NG-T output, and decreased bowel caliber on X-Ray.Continue management per surgeon.  NG-T dc'ed  1/9, +BM  resolved, and pt declined surgical intervention  -diet advanced to regular on 1/11 and she has continued to tolerate it well  2. Hyponatremia: Sodium was 121 at presentation. This is likely secondary to dehydration, as well as pre-admission diuretic therapy. Diuretics are on hold. managing with iv NS, with improvement. sodium has normalized at 139 on1/15/14.  -Follow recheck.  3. HTN (hypertension): Continue current regimen of atenolol and spironolactone. 4. Painful right foot hematoma: Patient has a hematoma on the dorsum of her right foot about 4 cm in diameter, following a recent fall. Fortunately, no bony injuries on plain films. Clinically, there is no evidence of infection at this time. This is gradually resolving.  5. Acute urinary retention: This occurred overnight on 05/02/12, and was addressed with Foley catheter. Trial of voiding.  6. Leukocytosis: Patient had a high, persistent leukocytosis, although she is afebrile, CXR was devoid of infiltrates and urinalysis was negative. Commenced on empiric Zosyn on 05/02/12.  -Blood  cultures grew GPR in identified as clostridium perfringens, appreciate ID assistance will, continue zosyn and follow repeat cultures  -Urine culture grew E. Coli.  -still increasing even with treatment as above, discussed with ID  -lactic acid level elevated 1/10- with this there is concern for the possibility of necrotic bowel although pt has no symptoms c/w this at this time, her sbo is resolving. The prior MD discussed this with pt and son and she voices understanding and states that at this age she does not want to have any surgery done. Also discussed pt with Dr Biagio Quint who states since pt does not want surgery will continue conservative management  -recheck lactic acid leval normalized at 1.2 on 1/11 and leukocytosis continues to decreased to15 on 1/12  -wbc back up to 27 1/13( from 15), and pt with increased cough and some dyspnea>>cxr showed bilateral infiltrates and vanc was added >>wbc 16.8 today 1/14>>discussed with ID today as below and will d/c abx and monitor  Repeat chest x-ray with patchy bilateral infiltrates. Per ID continue to monitor off antibiotics. Will place on Tessalon Perles. 7. UTI, Ecoli/enterococcus: both sensitive to zosyn, per ID note on 1/10 - to follow and d/c abx if Pt remaining afebrile and hemodynamically stable.  8.Clostridium perfringens Bacteremia - appreciate ID assistance, repeat cultures with no growth>>per ID 1/10 to follow. Antibiotics discontinued per ID. Per ID follow  9. Sore throat/Dysphonia: Patient developed a sore throat and hoarseness, after reinsertion of displaced NG-T on 05/02/11. Throat appeared clear, without hyperemia or exudate. Managed successfuly with with Chloraseptic spray.  -improved 1/9 s/p NGT removal  10. AKI: Had jump in creatinine from 0.93 to 1.72, overnight on 05/02/12. Responded to iv fluids, and creatinine has normalized at 0.86 as of  05/03/12- and remains wnl.  11.moderate oral and mild pharyngeal phase dysphagia: Per modified barium  swallow 05/11/2012. Place on dysphagia 3 diet with nectar thick liquids thin water between meals after oral care. Speech therapy following. 12. Diarrhea :  -appeared to decreasing follwing d/c of Colace 1/12, if persists will recommend repeat C.diff PCR  -Patient has had C. difficile PCR which came back negative on 1/9  13. Hypernatremia  Resolved, #11 was likely contributing -changed to hypotonic ivf on 1/12, HCTZ was also decreased(now dc'ed)  14. Hypokalemia  -replace k  15.Lung infiltrates - PNA vs Pulm edema  - discussed with ID/Dr Luciana Axe and he agrees that wbc of 27 on 1/13 was likely erroneous and recommends to d/c abx  - Patient given IV lasix yesterday. Chest x-ray with patchy bilateral infiltrates. Monitor off antibiotics. We'll give a dose of Lasix today. 2-D echo with EF of 50-55%. No wall motion abnormalities. Left atrium was mildly dilated. Mildly to moderately fibrotic mitral valve annulus. 16. Dysphagia- Moderate oral with mild pharyngeal  -dysphagia diet with Nectar-thick liquid (thin water between meals after oral care)      Code Status: DO NOT RESUSCITATE Family Communication: Updated patient no family at bedside. Disposition Plan: SNF   Consultants: Almond Lint, surgeon.  Dr. Luciana Axe, infectious diseases  Procedures: CXR.  Abdominal X-Ray.  X-Ray right foot.  CT Abdomen/pelvis.      Antibiotics: Zoysn>> stopped 1/14  vanc started 1/13>>1/14    HPI/Subjective: Patient states doesnot feel as well as she did yesterday. Per nursing patient with BM today. Patient tolerating diet.  Objective: Filed Vitals:   05/11/12 1441 05/11/12 2147 05/11/12 2243 05/12/12 0504  BP: 128/60 130/67  137/48  Pulse: 77 78 72 78  Temp: 98.5 F (36.9 C) 98.6 F (37 C)  98.7 F (37.1 C)  TempSrc: Oral Oral  Oral  Resp: 20 18  18   Height:      Weight:    67.5 kg (148 lb 13 oz)  SpO2: 97% 96%  98%    Intake/Output Summary (Last 24 hours) at 05/12/12 1242 Last data  filed at 05/12/12 0600  Gross per 24 hour  Intake    700 ml  Output      0 ml  Net    700 ml   Filed Weights   04/30/12 2232 05/01/12 0816 05/12/12 0504  Weight: 65.772 kg (145 lb) 67.7 kg (149 lb 4 oz) 67.5 kg (148 lb 13 oz)    Exam:   General:  NAD  Cardiovascular: RRR  Respiratory: BIBASILAR CRACKLES, SOME SCATTERED COARSE BS  Abdomen: mILD DISTENSION, NT,+BS  Data Reviewed: Basic Metabolic Panel:  Lab 05/12/12 1610 05/11/12 0505 05/10/12 1649 05/10/12 0444 05/09/12 0519 05/06/12 0510  NA 139 137 136 139 146* --  K 3.3* 3.7 3.7 3.2* 4.1 --  CL 105 106 104 107 114* --  CO2 23 22 21 21 22  --  GLUCOSE 124* 112* 113* 120* 119* --  BUN 15 13 13 12 19  --  CREATININE 0.81 0.68 0.75 0.69 0.84 --  CALCIUM 8.2* 8.1* 8.3* 8.1* 8.1* --  MG -- -- -- -- -- 2.1  PHOS -- -- -- -- -- --   Liver Function Tests: No results found for this basename: AST:5,ALT:5,ALKPHOS:5,BILITOT:5,PROT:5,ALBUMIN:5 in the last 168 hours No results found for this basename: LIPASE:5,AMYLASE:5 in the last 168 hours No results found for this basename: AMMONIA:5 in the last 168 hours CBC:  Lab 05/12/12 0450 05/11/12 0505 05/10/12 0444 05/09/12  0519 05/08/12 0541  WBC 16.5* 16.8* 27.5* 15.5* 17.0*  NEUTROABS -- -- -- -- --  HGB 10.0* 8.8* 8.4* 8.8* 8.7*  HCT 31.7* 27.5* 26.6* 27.7* 27.4*  MCV 85.0 84.1 84.7 86.3 85.9  PLT 323 268 256 214 191   Cardiac Enzymes: No results found for this basename: CKTOTAL:5,CKMB:5,CKMBINDEX:5,TROPONINI:5 in the last 168 hours BNP (last 3 results)  Basename 05/11/12 0505  PROBNP 5916.0*   CBG:  Lab 05/12/12 0731 05/11/12 1709 05/09/12 1642 05/09/12 0733 05/08/12 1716  GLUCAP 128* 145* 135* 112* 142*    Recent Results (from the past 240 hour(s))  URINE CULTURE     Status: Normal   Collection Time   05/02/12  2:03 PM      Component Value Range Status Comment   Specimen Description URINE, CATHETERIZED   Final    Special Requests none   Final    Culture  Setup  Time 05/02/2012 17:24   Final    Colony Count >=100,000 COLONIES/ML   Final    Culture     Final    Value: ESCHERICHIA COLI     ENTEROCOCCUS SPECIES   Report Status 05/06/2012 FINAL   Final    Organism ID, Bacteria ESCHERICHIA COLI   Final    Organism ID, Bacteria ENTEROCOCCUS SPECIES   Final   CULTURE, BLOOD (ROUTINE X 2)     Status: Normal   Collection Time   05/05/12  6:00 PM      Component Value Range Status Comment   Specimen Description BLOOD RIGHT HAND   Final    Special Requests BOTTLES DRAWN AEROBIC AND ANAEROBIC 10CC   Final    Culture  Setup Time 05/05/2012 22:35   Final    Culture NO GROWTH 5 DAYS   Final    Report Status 05/11/2012 FINAL   Final   CULTURE, BLOOD (ROUTINE X 2)     Status: Normal   Collection Time   05/05/12  6:30 PM      Component Value Range Status Comment   Specimen Description BLOOD RIGHT ARM   Final    Special Requests BOTTLES DRAWN AEROBIC AND ANAEROBIC 10CC   Final    Culture  Setup Time 05/05/2012 22:35   Final    Culture NO GROWTH 5 DAYS   Final    Report Status 05/11/2012 FINAL   Final   CLOSTRIDIUM DIFFICILE BY PCR     Status: Normal   Collection Time   05/06/12  2:53 PM      Component Value Range Status Comment   C difficile by pcr NEGATIVE  NEGATIVE Final      Studies: Dg Chest Port 1 View  05/12/2012  *RADIOLOGY REPORT*  Clinical Data: Pulmonary infiltrates.  PORTABLE CHEST - 1 VIEW  Comparison: 01/13 and 05/01/2012  Findings: There is a faint patchy bilateral pulmonary infiltrates appears slightly more prominent particularly in the left upper and lower lung zones.  Overall heart size and vascularity are normal. Stable pleural calcification and soft tissue density in the left upper lung zone. There is a compression fracture of the superior aspect of T11, age indeterminate.  IMPRESSION: Faint patchy bilateral pulmonary infiltrates appears slightly progressed.   Original Report Authenticated By: Francene Boyers, M.D.    Dg Swallowing Func-speech  Pathology  05/12/2012  Patient Information       Patient Name Sex DOB SSN    [name], [name] Female 12-17-1920 [id number]       Progress Notes signed by Delaney Meigs  Brigitte Pulse, CCC-SLP at 05/12/12 1610     Author: Chales Abrahams, CCC-SLP Service: (none) Author Type: Speech and Language Pathologist    Filed: 05/12/12 0950 Note Time: 05/12/11 1143           Modified Barium Swallow   Name: LUCEE BRISSETT MRN: [id number] Date of Birth: [birth year]   Today's Date: 05/11/2012 Time: 1025-1049 SLP Time Calculation (min): 24 min   Past Medical History:   Past Medical History   Diagnosis  Date   .  Hypertension     .  Irregular heart beat        Past Surgical History:   Past Surgical History   Procedure  Date   .  Cataract extraction        HPI:   GABRIEL PAULDING is a 79 y.o. female  pt is s/p NG for decompression/SBO.  Pt with c/o N/V 2 days prior.  Per RN today, pt has been incontinent of bowel and bladder - is having bowel movevement.   Pt has hx of chronic constipations.  No fever or chills.  No diarrhea. She have had some URI symptoms all last week.  Swallow evaluation was ordered and BSE completed yesterday with concerns for pt aspirating.  Diet was modified to dys3/honey and MBS ordered.  CXR 1/13 showed worsening lung aeration due to asymmetric edema or bilateral infiltrates.  Pt for repeat CXR today.        Assessment / Plan / Recommendation Clinical Impression    Dysphagia Diagnosis: Moderate oral phase dysphagia;Mild pharyngeal phase dysphagia  Clinical impression: Pt presents with moderate oral and mild pharyngeal phase dysphagia with single episode of mild aspiration  with thin liquid (with delayed reflexive cough.)  Trace aspiration occured due to oral residue that spilled into pharynx and accumulated at pyriform sinus without pt awareness- therefore when pt spilled barium into larynx/trachea with CUED swallow.   Further boluses (x9) were not aspirated or deeply penetrated.  Unfortunately pt coughed frequently  during testing without barium visualized in larynx/trachea- making clinical follow up difficult.  Delayed oral transit, decr oral coordination resulting in oral residuals with all boluses.  Consuming intake appears effortful for this pt, evidenced by pt producing a heavy sigh with cracker retained in her mouth.   Due to aspiration episode without clearance, rec consider advancing diet to soft/nectar while acutely ill.  Also recommend allow pt thin water between meals (after oral care) for hydration.  Anticipate pt's swallow to improve consistent with medical  progression.    SLP to follow for education/tolerance.  Thanks for the order.       Treatment Recommendation    Therapy as outlined in treatment plan below       Diet Recommendation  Dysphagia 3 (Mechanical Soft);Nectar-thick liquid (thin water between meals after oral care)    Liquid Administration via: Cup;Straw Medication Administration: Whole meds with puree Supervision: Full supervision/cueing for compensatory strategies;Patient able to self feed Compensations: Slow rate;Small sips/bites (intermittent dry swallow- follow solids with liquids)      Other  Recommendations  Oral Care Recommendations: Oral care BID    Follow Up Recommendations    24 hour supervision/assistance     Frequency and Duration  min 2x/week    2 weeks    Pertinent Vitals/Pain  Afebrile, decreased- productive cough to clear secretions        SLP Swallow Goals Patient will consume recommended diet without observed clinical signs of aspiration with:  (discontinued:  pt  with overt coughing without po) Patient will utilize recommended strategies during swallow to increase swallowing safety with: Moderate cueing      General  Date of Onset: 05/11/12  HPI: JAEDEN WESTBAY is a 79 y.o. female  pt is s/p NG for decompression/SBO.  Pt with c/o N/V 2 days prior.  Per RN today, pt has been incontinent of bowel and bladder - is having bowel movevement.   Pt has hx of chronic constipations.  No  fever or chills.  No diarrhea. She have had some URI symptoms all last week.  Swallow evaluation was ordered and BSE completed yesterday with concerns for pt aspirating.  Diet was modified to dys3/honey and MBS ordered.  CXR 1/13 showed worsening lung aeration due to asymmetric edema or bilateral infiltrates.  Pt for repeat CXR today.   Type of Study: Modified Barium Swallowing Study Reason for Referral: Objectively evaluate swallowing function Diet Prior to this Study: Dysphagia 3 (soft);Honey-thick liquids Temperature Spikes Noted: No Respiratory Status: Room air History of Recent Intubation: No Behavior/Cognition: Alert;Cooperative;Requires cueing;Distractible Oral Cavity - Dentition: Adequate natural dentition Self-Feeding Abilities: Needs assist;Needs set up Patient Positioning: Upright in chair Baseline Vocal Quality: Clear Volitional Cough: Congested (productive) Volitional Swallow: Able to elicit Anatomy: Within functional limits Pharyngeal Secretions: Not observed secondary MBS      Reason for Referral  Objectively evaluate swallowing function    Oral Phase  Oral Preparation/Oral Phase  Oral Phase: Impaired Oral - Nectar Oral - Nectar Teaspoon: Reduced posterior propulsion;Delayed oral transit;Piecemeal swallowing;Weak lingual manipulation Oral - Nectar Cup: Reduced posterior propulsion;Piecemeal swallowing;Delayed oral transit;Weak lingual manipulation Oral - Thin Oral - Thin Teaspoon: Reduced posterior propulsion;Delayed oral transit;Lingual/palatal residue;Piecemeal swallowing;Weak lingual manipulation Oral - Thin Cup: Reduced posterior propulsion;Delayed oral transit;Lingual/palatal residue;Piecemeal swallowing;Weak lingual manipulation Oral - Thin Straw: Reduced posterior propulsion;Delayed oral transit;Piecemeal swallowing;Lingual/palatal residue;Weak lingual manipulation Oral - Solids Oral - Puree: Reduced posterior propulsion;Lingual/palatal residue;Piecemeal swallowing;Delayed oral transit;Weak  lingual manipulation;Pocketing in anterior sulcus Oral - Regular: Reduced posterior propulsion;Lingual/palatal residue;Piecemeal swallowing;Delayed oral transit;Impaired mastication;Weak lingual manipulation;Pocketing in anterior sulcus Oral Phase - Comment Oral Phase - Comment: pt with pocketing in anterior region with solids without adequate awareness, dry and liquid swallows aid oral clearance, piecemeal deglutiton noted as oral residuals spill into pharynx after the swallow    Pharyngeal Phase  Pharyngeal Phase  Pharyngeal Phase: Impaired Pharyngeal - Nectar Pharyngeal - Nectar Teaspoon: Premature spillage to valleculae Pharyngeal - Nectar Cup: Premature spillage to valleculae Pharyngeal - Thin Pharyngeal - Thin Teaspoon: Premature spillage to valleculae;Reduced anterior laryngeal mobility;Reduced laryngeal elevation;Reduced airway/laryngeal closure Pharyngeal - Thin Cup: Premature spillage to pyriform sinuses;Trace aspiration;Penetration/Aspiration after swallow;Pharyngeal residue - pyriform sinuses;Penetration/Aspiration during swallow;Reduced anterior laryngeal mobility;Reduced laryngeal elevation;Reduced airway/laryngeal closure (mild asp-trace penetration during the swallow) Penetration/Aspiration details (thin cup): Material enters airway, passes BELOW cords and not ejected out despite cough attempt by patient Pharyngeal - Thin Straw: Premature spillage to valleculae Pharyngeal - Solids Pharyngeal - Puree: Delayed swallow initiation;Premature spillage to valleculae Pharyngeal - Regular: Delayed swallow initiation;Premature spillage to valleculae Pharyngeal Phase - Comment Pharyngeal Comment: mild delayed audible aspiration x1 with delayed cough due to oral residue of thin barium accumulating at pyriform sinus without swallow trigger, cued delayed swallow resulted in mild aspiration   Cervical Esophageal Phase        GO       Cervical Esophageal Phase  Cervical Esophageal Phase: Impaired Cervical Esophageal  Phase - Comment Cervical Esophageal Comment: appearance of prominent cricopharyngeus observed that did not impair barium  flow, appeared of delayed distal clearance of liquid barium without pt awareness- that may be consistent with mild motility issues, at completion of MBS esophagus appeared clear, radiologist not present to confirm             Donavan Burnet, MS [facility] SLP [phone]              Scheduled Meds:   . atenolol  50 mg Oral BID  . lip balm  1 application Topical BID  . loratadine  10 mg Oral Daily  . pantoprazole  40 mg Oral BID  . potassium chloride  20 mEq Oral BID  . saccharomyces boulardii  250 mg Oral BID  . sodium chloride  3 mL Intravenous Q12H  . spironolactone  25 mg Oral Daily   Continuous Infusions:   . sodium chloride 20 mL/hr (05/10/12 1717)    Principal Problem:  *SBO (small bowel obstruction) Active Problems:  Hyponatremia  HTN (hypertension)  Traumatic hematoma of foot  Leukocytosis  Hypokalemia  HAP (hospital-acquired pneumonia)    Time spent: . 30 mins    [facility]  [facility] Pager [phone]. If 8PM-8AM, please contact night-coverage at [email or web address], password [facility] 05/12/2012, 12:42 PM  LOS: 12 days

## 2012-05-12 NOTE — Progress Notes (Signed)
Modified Barium Swallow  Name: [name] MRN: [id number] Date of Birth: July 17, 1920  Today's Date: 05/11/2012 Time: 1025-1049 SLP Time Calculation (min): 24 min  Past Medical History:  Past Medical History  Diagnosis Date  . Hypertension   . Irregular heart beat    Past Surgical History:  Past Surgical History  Procedure Date  . Cataract extraction    HPI:  [name] is a 79 y.o. female  pt is s/p NG for decompression/SBO.  Pt with c/o N/V 2 days prior.  Per RN today, pt has been incontinent of bowel and bladder - is having bowel movevement.   Pt has hx of chronic constipations.  No fever or chills.  No diarrhea. She have had some URI symptoms all last week.  Swallow evaluation was ordered and BSE completed yesterday with concerns for pt aspirating.  Diet was modified to dys3/honey and MBS ordered.  CXR 1/13 showed worsening lung aeration due to asymmetric edema or bilateral infiltrates.  Pt for repeat CXR today.      Assessment / Plan / Recommendation Clinical Impression  Dysphagia Diagnosis: Moderate oral phase dysphagia;Mild pharyngeal phase dysphagia Clinical impression: Pt presents with moderate oral and mild pharyngeal phase dysphagia with single episode of mild aspiration  with thin liquid (with delayed reflexive cough.)  Trace aspiration occured due to oral residue that spilled into pharynx and accumulated at pyriform sinus without pt awareness- therefore when pt spilled barium into larynx/trachea with CUED swallow.   Further boluses (x9) were not aspirated or deeply penetrated.  Unfortunately pt coughed frequently during testing without barium visualized in larynx/trachea- making clinical follow up difficult.  Delayed oral transit, decr oral coordination resulting in oral residuals with all boluses.  Consuming intake appears effortful for this pt, evidenced by pt producing a heavy sigh with cracker retained in her mouth.   Due to aspiration episode without clearance,  rec consider advancing diet to soft/nectar while acutely ill.  Also recommend allow pt thin water between meals (after oral care) for hydration.  Anticipate pt's swallow to improve consistent with medical  progression.    SLP to follow for education/tolerance.  Thanks for the order.      Treatment Recommendation  Therapy as outlined in treatment plan below    Diet Recommendation Dysphagia 3 (Mechanical Soft);Nectar-thick liquid (thin water between meals after oral care)   Liquid Administration via: Cup;Straw Medication Administration: Whole meds with puree Supervision: Full supervision/cueing for compensatory strategies;Patient able to self feed Compensations: Slow rate;Small sips/bites (intermittent dry swallow- follow solids with liquids)    Other  Recommendations Oral Care Recommendations: Oral care BID   Follow Up Recommendations  24 hour supervision/assistance    Frequency and Duration min 2x/week  2 weeks   Pertinent Vitals/Pain Afebrile, decreased- productive cough to clear secretions    SLP Swallow Goals Patient will consume recommended diet without observed clinical signs of aspiration with:  (discontinued:  pt with overt coughing without po) Patient will utilize recommended strategies during swallow to increase swallowing safety with: Moderate cueing   General Date of Onset: 05/11/12 HPI: [name] is a 79 y.o. female  pt is s/p NG for decompression/SBO.  Pt with c/o N/V 2 days prior.  Per RN today, pt has been incontinent of bowel and bladder - is having bowel movevement.   Pt has hx of chronic constipations.  No fever or chills.  No diarrhea. She have had some URI symptoms all last week.  Swallow evaluation was ordered  and BSE completed yesterday with concerns for pt aspirating.  Diet was modified to dys3/honey and MBS ordered.  CXR 1/13 showed worsening lung aeration due to asymmetric edema or bilateral infiltrates.  Pt for repeat CXR today.  Type of Study: Modified  Barium Swallowing Study Reason for Referral: Objectively evaluate swallowing function Diet Prior to this Study: Dysphagia 3 (soft);Honey-thick liquids Temperature Spikes Noted: No Respiratory Status: Room air History of Recent Intubation: No Behavior/Cognition: Alert;Cooperative;Requires cueing;Distractible Oral Cavity - Dentition: Adequate natural dentition Self-Feeding Abilities: Needs assist;Needs set up Patient Positioning: Upright in chair Baseline Vocal Quality: Clear Volitional Cough: Congested (productive) Volitional Swallow: Able to elicit Anatomy: Within functional limits Pharyngeal Secretions: Not observed secondary MBS    Reason for Referral Objectively evaluate swallowing function   Oral Phase Oral Preparation/Oral Phase Oral Phase: Impaired Oral - Nectar Oral - Nectar Teaspoon: Reduced posterior propulsion;Delayed oral transit;Piecemeal swallowing;Weak lingual manipulation Oral - Nectar Cup: Reduced posterior propulsion;Piecemeal swallowing;Delayed oral transit;Weak lingual manipulation Oral - Thin Oral - Thin Teaspoon: Reduced posterior propulsion;Delayed oral transit;Lingual/palatal residue;Piecemeal swallowing;Weak lingual manipulation Oral - Thin Cup: Reduced posterior propulsion;Delayed oral transit;Lingual/palatal residue;Piecemeal swallowing;Weak lingual manipulation Oral - Thin Straw: Reduced posterior propulsion;Delayed oral transit;Piecemeal swallowing;Lingual/palatal residue;Weak lingual manipulation Oral - Solids Oral - Puree: Reduced posterior propulsion;Lingual/palatal residue;Piecemeal swallowing;Delayed oral transit;Weak lingual manipulation;Pocketing in anterior sulcus Oral - Regular: Reduced posterior propulsion;Lingual/palatal residue;Piecemeal swallowing;Delayed oral transit;Impaired mastication;Weak lingual manipulation;Pocketing in anterior sulcus Oral Phase - Comment Oral Phase - Comment: pt with pocketing in anterior region with solids without  adequate awareness, dry and liquid swallows aid oral clearance, piecemeal deglutiton noted as oral residuals spill into pharynx after the swallow   Pharyngeal Phase Pharyngeal Phase Pharyngeal Phase: Impaired Pharyngeal - Nectar Pharyngeal - Nectar Teaspoon: Premature spillage to valleculae Pharyngeal - Nectar Cup: Premature spillage to valleculae Pharyngeal - Thin Pharyngeal - Thin Teaspoon: Premature spillage to valleculae;Reduced anterior laryngeal mobility;Reduced laryngeal elevation;Reduced airway/laryngeal closure Pharyngeal - Thin Cup: Premature spillage to pyriform sinuses;Trace aspiration;Penetration/Aspiration after swallow;Pharyngeal residue - pyriform sinuses;Penetration/Aspiration during swallow;Reduced anterior laryngeal mobility;Reduced laryngeal elevation;Reduced airway/laryngeal closure (mild asp-trace penetration during the swallow) Penetration/Aspiration details (thin cup): Material enters airway, passes BELOW cords and not ejected out despite cough attempt by patient Pharyngeal - Thin Straw: Premature spillage to valleculae Pharyngeal - Solids Pharyngeal - Puree: Delayed swallow initiation;Premature spillage to valleculae Pharyngeal - Regular: Delayed swallow initiation;Premature spillage to valleculae Pharyngeal Phase - Comment Pharyngeal Comment: mild delayed audible aspiration x1 with delayed cough due to oral residue of thin barium accumulating at pyriform sinus without swallow trigger, cued delayed swallow resulted in mild aspiration  Cervical Esophageal Phase    GO    Cervical Esophageal Phase Cervical Esophageal Phase: Impaired Cervical Esophageal Phase - Comment Cervical Esophageal Comment: appearance of prominent cricopharyngeus observed that did not impair barium flow, appeared of delayed distal clearance of liquid barium without pt awareness- that may be consistent with mild motility issues, at completion of MBS esophagus appeared clear, radiologist not  present to confirm        Donavan Burnet, MS [facility] SLP [phone]

## 2012-05-12 NOTE — Progress Notes (Signed)
[facility]  Date of Admission:  04/30/2012  Antibiotics: Continues off antibiotics  Subjective: No acute events  Objective: Temp:  [98.5 F (36.9 C)-98.7 F (37.1 C)] 98.7 F (37.1 C) (01/15 0504) Pulse Rate:  [72-78] 78  (01/15 0504) Resp:  [18-20] 18  (01/15 0504) BP: (128-137)/(48-67) 137/48 mmHg (01/15 0504) SpO2:  [96 %-98 %] 98 % (01/15 0504) Weight:  [148 lb 13 oz (67.5 kg)] 148 lb 13 oz (67.5 kg) (01/15 0504)  General: Awake, alert, nad   Lab Results Lab Results  Component Value Date   WBC 16.5* 05/12/2012   HGB 10.0* 05/12/2012   HCT 31.7* 05/12/2012   MCV 85.0 05/12/2012   PLT 323 05/12/2012    Lab Results  Component Value Date   CREATININE 0.81 05/12/2012   BUN 15 05/12/2012   NA 139 05/12/2012   K 3.3* 05/12/2012   CL 105 05/12/2012   CO2 23 05/12/2012    Lab Results  Component Value Date   ALT 13 05/03/2012   AST 38* 05/03/2012   ALKPHOS 70 05/03/2012   BILITOT 0.8 05/03/2012      Microbiology: Recent Results (from the past 240 hour(s))  CULTURE, BLOOD (ROUTINE X 2)     Status: Normal   Collection Time   05/02/12 12:14 PM      Component Value Range Status Comment   Specimen Description BLOOD LEFT ARM   Final    Special Requests     Final    Value: BOTTLES DRAWN AEROBIC AND ANAEROBIC 10CC BOTH BOTTLES   Culture  Setup Time 05/02/2012 17:19   Final    Culture     Final    Value: CLOSTRIDIUM PERFRINGENS     Note: Gram Stain Report Called to,Read Back By and Verified With: Lacey Pine RN on 05/03/12 at 04:37 by [name]   Report Status 05/04/2012 FINAL   Final   CULTURE, BLOOD (ROUTINE X 2)     Status: Normal   Collection Time   05/02/12 12:14 PM      Component Value Range Status Comment   Specimen Description BLOOD LEFT ARM   Final    Special Requests     Final    Value: BOTTLES DRAWN AEROBIC AND ANAEROBIC 6CC BOTH BOTTLES   Culture  Setup Time 05/02/2012 17:20   Final    Culture     Final    Value: CLOSTRIDIUM PERFRINGENS   Note: Gram Stain Report Called to,Read Back By and Verified With: Lacey Pine RN on 05/03/12 at 04:37 by [name]   Report Status 05/04/2012 FINAL   Final   URINE CULTURE     Status: Normal   Collection Time   05/02/12  2:03 PM      Component Value Range Status Comment   Specimen Description URINE, CATHETERIZED   Final    Special Requests none   Final    Culture  Setup Time 05/02/2012 17:24   Final    Colony Count >=100,000 COLONIES/ML   Final    Culture     Final    Value: ESCHERICHIA COLI     ENTEROCOCCUS SPECIES   Report Status 05/06/2012 FINAL   Final    Organism ID, Bacteria ESCHERICHIA COLI   Final    Organism ID, Bacteria ENTEROCOCCUS SPECIES   Final   CULTURE, BLOOD (ROUTINE X 2)     Status: Normal   Collection Time   05/05/12  6:00 PM  Component Value Range Status Comment   Specimen Description BLOOD RIGHT HAND   Final    Special Requests BOTTLES DRAWN AEROBIC AND ANAEROBIC 10CC   Final    Culture  Setup Time 05/05/2012 22:35   Final    Culture NO GROWTH 5 DAYS   Final    Report Status 05/11/2012 FINAL   Final   CULTURE, BLOOD (ROUTINE X 2)     Status: Normal   Collection Time   05/05/12  6:30 PM      Component Value Range Status Comment   Specimen Description BLOOD RIGHT ARM   Final    Special Requests BOTTLES DRAWN AEROBIC AND ANAEROBIC 10CC   Final    Culture  Setup Time 05/05/2012 22:35   Final    Culture NO GROWTH 5 DAYS   Final    Report Status 05/11/2012 FINAL   Final   CLOSTRIDIUM DIFFICILE BY PCR     Status: Normal   Collection Time   05/06/12  2:53 PM      Component Value Range Status Comment   C difficile by pcr NEGATIVE  NEGATIVE Final     Studies/Results: Dg Chest Port 1 View  05/12/2012  *RADIOLOGY REPORT*  Clinical Data: Pulmonary infiltrates.  PORTABLE CHEST - 1 VIEW  Comparison: 01/13 and 05/01/2012  Findings: There is a faint patchy bilateral pulmonary infiltrates appears slightly more prominent particularly in the left upper and lower lung zones.   Overall heart size and vascularity are normal. Stable pleural calcification and soft tissue density in the left upper lung zone. There is a compression fracture of the superior aspect of T11, age indeterminate.  IMPRESSION: Faint patchy bilateral pulmonary infiltrates appears slightly progressed.   Original Report Authenticated By: [name], M.D.    Dg Swallowing Func-speech Pathology  05/12/2012  Patient Information       Patient Name Sex DOB SSN    [name] Female 1920/06/21 [id number]       Progress Notes signed by [name], CCC-SLP at 05/12/12 6213     Author: Chales Myers, CCC-SLP Service: (none) Author Type: Speech and Language Pathologist    Filed: 05/12/12 0950 Note Time: 05/12/11 1143           Modified Barium Swallow   Name: [name] MRN: [id number] Date of Birth: May 22, 1920   Today's Date: 05/11/2012 Time: 1025-1049 SLP Time Calculation (min): 24 min   Past Medical History:   Past Medical History   Diagnosis  Date   .  Hypertension     .  Irregular heart beat        Past Surgical History:   Past Surgical History   Procedure  Date   .  Cataract extraction        HPI:   [name] is a 79 y.o. female  pt is s/p NG for decompression/SBO.  Pt with c/o N/V 2 days prior.  Per RN today, pt has been incontinent of bowel and bladder - is having bowel movevement.   Pt has hx of chronic constipations.  No fever or chills.  No diarrhea. She have had some URI symptoms all last week.  Swallow evaluation was ordered and BSE completed yesterday with concerns for pt aspirating.  Diet was modified to dys3/honey and MBS ordered.  CXR 1/13 showed worsening lung aeration due to asymmetric edema or bilateral infiltrates.  Pt for repeat CXR today.        Assessment / Plan /  Recommendation Clinical Impression    Dysphagia Diagnosis: Moderate oral phase dysphagia;Mild pharyngeal phase dysphagia  Clinical impression: Pt presents with moderate oral and mild pharyngeal phase dysphagia with  single episode of mild aspiration  with thin liquid (with delayed reflexive cough.)  Trace aspiration occured due to oral residue that spilled into pharynx and accumulated at pyriform sinus without pt awareness- therefore when pt spilled barium into larynx/trachea with CUED swallow.   Further boluses (x9) were not aspirated or deeply penetrated.  Unfortunately pt coughed frequently during testing without barium visualized in larynx/trachea- making clinical follow up difficult.  Delayed oral transit, decr oral coordination resulting in oral residuals with all boluses.  Consuming intake appears effortful for this pt, evidenced by pt producing a heavy sigh with cracker retained in her mouth.   Due to aspiration episode without clearance, rec consider advancing diet to soft/nectar while acutely ill.  Also recommend allow pt thin water between meals (after oral care) for hydration.  Anticipate pt's swallow to improve consistent with medical  progression.    SLP to follow for education/tolerance.  Thanks for the order.       Treatment Recommendation    Therapy as outlined in treatment plan below       Diet Recommendation  Dysphagia 3 (Mechanical Soft);Nectar-thick liquid (thin water between meals after oral care)    Liquid Administration via: Cup;Straw Medication Administration: Whole meds with puree Supervision: Full supervision/cueing for compensatory strategies;Patient able to self feed Compensations: Slow rate;Small sips/bites (intermittent dry swallow- follow solids with liquids)      Other  Recommendations  Oral Care Recommendations: Oral care BID    Follow Up Recommendations    24 hour supervision/assistance     Frequency and Duration  min 2x/week    2 weeks    Pertinent Vitals/Pain  Afebrile, decreased- productive cough to clear secretions        SLP Swallow Goals Patient will consume recommended diet without observed clinical signs of aspiration with:  (discontinued:  pt with overt coughing without po) Patient  will utilize recommended strategies during swallow to increase swallowing safety with: Moderate cueing      General  Date of Onset: 05/11/12  HPI: [name] is a 79 y.o. female  pt is s/p NG for decompression/SBO.  Pt with c/o N/V 2 days prior.  Per RN today, pt has been incontinent of bowel and bladder - is having bowel movevement.   Pt has hx of chronic constipations.  No fever or chills.  No diarrhea. She have had some URI symptoms all last week.  Swallow evaluation was ordered and BSE completed yesterday with concerns for pt aspirating.  Diet was modified to dys3/honey and MBS ordered.  CXR 1/13 showed worsening lung aeration due to asymmetric edema or bilateral infiltrates.  Pt for repeat CXR today.   Type of Study: Modified Barium Swallowing Study Reason for Referral: Objectively evaluate swallowing function Diet Prior to this Study: Dysphagia 3 (soft);Honey-thick liquids Temperature Spikes Noted: No Respiratory Status: Room air History of Recent Intubation: No Behavior/Cognition: Alert;Cooperative;Requires cueing;Distractible Oral Cavity - Dentition: Adequate natural dentition Self-Feeding Abilities: Needs assist;Needs set up Patient Positioning: Upright in chair Baseline Vocal Quality: Clear Volitional Cough: Congested (productive) Volitional Swallow: Able to elicit Anatomy: Within functional limits Pharyngeal Secretions: Not observed secondary MBS      Reason for Referral  Objectively evaluate swallowing function    Oral Phase  Oral Preparation/Oral Phase  Oral Phase: Impaired Oral - Nectar Oral - Nectar  Teaspoon: Reduced posterior propulsion;Delayed oral transit;Piecemeal swallowing;Weak lingual manipulation Oral - Nectar Cup: Reduced posterior propulsion;Piecemeal swallowing;Delayed oral transit;Weak lingual manipulation Oral - Thin Oral - Thin Teaspoon: Reduced posterior propulsion;Delayed oral transit;Lingual/palatal residue;Piecemeal swallowing;Weak lingual manipulation Oral - Thin Cup: Reduced  posterior propulsion;Delayed oral transit;Lingual/palatal residue;Piecemeal swallowing;Weak lingual manipulation Oral - Thin Straw: Reduced posterior propulsion;Delayed oral transit;Piecemeal swallowing;Lingual/palatal residue;Weak lingual manipulation Oral - Solids Oral - Puree: Reduced posterior propulsion;Lingual/palatal residue;Piecemeal swallowing;Delayed oral transit;Weak lingual manipulation;Pocketing in anterior sulcus Oral - Regular: Reduced posterior propulsion;Lingual/palatal residue;Piecemeal swallowing;Delayed oral transit;Impaired mastication;Weak lingual manipulation;Pocketing in anterior sulcus Oral Phase - Comment Oral Phase - Comment: pt with pocketing in anterior region with solids without adequate awareness, dry and liquid swallows aid oral clearance, piecemeal deglutiton noted as oral residuals spill into pharynx after the swallow    Pharyngeal Phase  Pharyngeal Phase  Pharyngeal Phase: Impaired Pharyngeal - Nectar Pharyngeal - Nectar Teaspoon: Premature spillage to valleculae Pharyngeal - Nectar Cup: Premature spillage to valleculae Pharyngeal - Thin Pharyngeal - Thin Teaspoon: Premature spillage to valleculae;Reduced anterior laryngeal mobility;Reduced laryngeal elevation;Reduced airway/laryngeal closure Pharyngeal - Thin Cup: Premature spillage to pyriform sinuses;Trace aspiration;Penetration/Aspiration after swallow;Pharyngeal residue - pyriform sinuses;Penetration/Aspiration during swallow;Reduced anterior laryngeal mobility;Reduced laryngeal elevation;Reduced airway/laryngeal closure (mild asp-trace penetration during the swallow) Penetration/Aspiration details (thin cup): Material enters airway, passes BELOW cords and not ejected out despite cough attempt by patient Pharyngeal - Thin Straw: Premature spillage to valleculae Pharyngeal - Solids Pharyngeal - Puree: Delayed swallow initiation;Premature spillage to valleculae Pharyngeal - Regular: Delayed swallow initiation;Premature spillage  to valleculae Pharyngeal Phase - Comment Pharyngeal Comment: mild delayed audible aspiration x1 with delayed cough due to oral residue of thin barium accumulating at pyriform sinus without swallow trigger, cued delayed swallow resulted in mild aspiration   Cervical Esophageal Phase        GO       Cervical Esophageal Phase  Cervical Esophageal Phase: Impaired Cervical Esophageal Phase - Comment Cervical Esophageal Comment: appearance of prominent cricopharyngeus observed that did not impair barium flow, appeared of delayed distal clearance of liquid barium without pt awareness- that may be consistent with mild motility issues, at completion of MBS esophagus appeared clear, radiologist not present to confirm             Donavan Burnet, MS [facility] SLP [phone]              Assessment/Plan: 1) C perfringens - isolated bacteremia not known to be pathogenic.  Stable.  2) leukocytosis - stable, no signs of active infection.  CXR noted with patchy infiltrates.  -continue to observe her off antibiotics.     Staci Righter, MD [facility] [phone] pager   05/12/2012, 11:45 AM

## 2012-05-12 NOTE — Progress Notes (Signed)
There are order for patient to ambulate but she is very weak and unable to perform this task.  She is 2 person assist to transfer.  MPennington,RN

## 2012-05-12 NOTE — Progress Notes (Signed)
Notify MD of bp results.  See new order ns bolus.  MPennington,RN

## 2012-05-12 NOTE — Progress Notes (Signed)
Speech Language Pathology Dysphagia Treatment Patient Details Name: [name] MRN: [id number] DOB: 06/10/1920 Today's Date: 05/12/2012 Time: 3086-5784 SLP Time Calculation (min): 42 min  Assessment / Plan / Recommendation Clinical Impression  Pt seen to educate son and pt results of swallowing evaluation and purpose of compensatory strategies being to minimize amount pt aspirates/maximize airway protection.   Pt observed swallowing nectar juice, icecream, applesauce and ice chips with intermittent coughing and expectoration of secretions.  PO again appears taxing for pt as she sighed  with food in her mouth.  Son reports pt has a h/o talking with food in her mouth with decreased awareness.  SLP suspects pt has subtle baseline dysphagia with acute worsening from deconditioning.   Educated son and pt that purpose of compensatory strategies is to decrease amount pt may aspirate, not prevent it.    Son and pt agreeable to continue dys3/nectar, allowing ice, icecream, popsicle, italian ice with meals.  Also [place] Levels water protocol information and heimlich information provided to son/pt and verbally reviewed.  Son and pt verbalized understanding to information provided and are agreeable to plan.   SLP to follow for tolerance and continued education.   Pt would benefit from follow up SLP at next venue of care.     Diet Recommendation  Initiate / Change Diet: Dysphagia 3 (mechanical soft);Nectar-thick liquid (icecream, ice, popsicle ok, frazier water protocol)    SLP Plan Continue with current plan of care   Pertinent Vitals/Pain Afebrile, decreased, cxr today showed Faint patchy bilateral pulmonary infiltrates appears slightly progressed.     Swallowing Goals  SLP Swallowing Goals Swallow Study Goal #2 - Progress: Progressing toward goal  General Temperature Spikes Noted: No Respiratory Status: Supplemental O2 delivered via (comment) Behavior/Cognition:  Alert;Cooperative;Distractible;Requires cueing Oral Cavity - Dentition: Adequate natural dentition Patient Positioning: Partially reclined   Dysphagia Treatment Treatment focused on: Patient/family/caregiver education;Skilled observation of diet tolerance;Utilization of compensatory strategies Family/Caregiver Educated: son Joe Treatment Methods/Modalities: Skilled observation Feeding: Needs assist Liquids provided via: Cup Oral Phase Signs & Symptoms: Prolonged mastication;Prolonged bolus formation;Prolonged oral phase Pharyngeal Phase Signs & Symptoms: Suspected delayed swallow initiation;Delayed cough;Immediate cough (cough productive to secretions: son reports cough w/ all po) Type of cueing: Verbal;Visual Amount of cueing: Total   GO    Donavan Burnet, MS [facility] SLP [phone]

## 2012-05-13 DIAGNOSIS — R197 Diarrhea, unspecified: Secondary | ICD-10-CM | POA: Diagnosis not present

## 2012-05-13 LAB — CBC WITH DIFFERENTIAL/PLATELET
Basophils Absolute: 0 10*3/uL (ref 0.0–0.1)
Eosinophils Relative: 0 % (ref 0–5)
Lymphs Abs: 2.1 10*3/uL (ref 0.7–4.0)
MCV: 86 fL (ref 78.0–100.0)
Monocytes Absolute: 3.1 10*3/uL — ABNORMAL HIGH (ref 0.1–1.0)
Monocytes Relative: 24 % — ABNORMAL HIGH (ref 3–12)
Neutrophils Relative %: 60 % (ref 43–77)
Platelets: 335 10*3/uL (ref 150–400)
RBC: 3.22 MIL/uL — ABNORMAL LOW (ref 3.87–5.11)
RDW: 15.4 % (ref 11.5–15.5)
WBC: 13.1 10*3/uL — ABNORMAL HIGH (ref 4.0–10.5)

## 2012-05-13 LAB — GLUCOSE, CAPILLARY
Glucose-Capillary: 93 mg/dL (ref 70–99)
Glucose-Capillary: 105 mg/dL — ABNORMAL HIGH (ref 70–99)

## 2012-05-13 LAB — BASIC METABOLIC PANEL
Calcium: 7.8 mg/dL — ABNORMAL LOW (ref 8.4–10.5)
Creatinine, Ser: 0.8 mg/dL (ref 0.50–1.10)
GFR calc non Af Amer: 63 mL/min — ABNORMAL LOW (ref >90)
Sodium: 141 mEq/L (ref 135–145)

## 2012-05-13 LAB — CLOSTRIDIUM DIFFICILE BY PCR: Toxigenic C. Difficile by PCR: NEGATIVE

## 2012-05-13 MED ORDER — LOPERAMIDE HCL 2 MG PO CAPS
2.0000 mg | ORAL_CAPSULE | ORAL | Status: DC | PRN
  Administered 2012-05-14 – 2012-05-16 (×2): 2 mg via ORAL
  Filled 2012-05-13 (×2): qty 1

## 2012-05-13 MED ORDER — LOPERAMIDE HCL 2 MG PO CAPS
4.0000 mg | ORAL_CAPSULE | Freq: Once | ORAL | Status: AC
  Administered 2012-05-13: 4 mg via ORAL
  Filled 2012-05-13: qty 1

## 2012-05-13 NOTE — Plan of Care (Signed)
Problem: Phase III Progression Outcomes Goal: Activity at appropriate level-compared to baseline (UP IN CHAIR FOR HEMODIALYSIS)  Outcome: Not Progressing Pt only able to sit EOB today as she reports nausea and lightheadedness upon sitting which did not resolve.

## 2012-05-13 NOTE — Progress Notes (Signed)
[facility] PROGRESS NOTE  [name] RUE:[id number] DOB: January 15, 1921 DOA: 04/30/2012 PCP: No primary provider on file.  Assessment/Plan:  1. SBO (small bowel obstruction): Patient presented with about 24 hours of abdominal pain, and vomiting. Imaging studies demonstrated small bowel obstruction with transition zone in the anterior central pelvis, associated with bowel wall thickening at the point of obstruction. This could represent an adhesion, though subtle mass lesion is not excluded. There was increased stool throughout colon, and a moderate sized hiatal hernia. Dr Girtha Hake provided surgical consultation and has recommended bowel rest and enemas, NG-T and follow up abdominal films. Clinically, patient appears to be making progress, with decreased NG-T output, and decreased bowel caliber on X-Ray.Continue management per surgeon.  NG-T dc'ed  1/9, +BM  resolved, and pt declined surgical intervention  -diet advanced to regular on 1/11 and she has continued to tolerate it well  2. Hyponatremia: Sodium was 121 at presentation. This is likely secondary to dehydration, as well as pre-admission diuretic therapy. Diuretics are on hold. managing with iv NS, with improvement. sodium has normalized at 141 today.  -Follow recheck.  3. HTN (hypertension): Continue current regimen of atenolol and spironolactone. 4. Painful right foot hematoma: Patient has a hematoma on the dorsum of her right foot about 4 cm in diameter, following a recent fall. Fortunately, no bony injuries on plain films. Clinically, there is no evidence of infection at this time. This is gradually resolving.  5. Acute urinary retention: This occurred overnight on 05/02/12, and was addressed with Foley catheter. Trial of voiding.  6. Leukocytosis: Patient had a high, persistent leukocytosis, although she is afebrile, CXR was devoid of infiltrates and urinalysis was negative. Leukocytosis is starting to trend down. Commenced on  empiric Zosyn on 05/02/12.  -Blood cultures grew GPR in identified as clostridium perfringens, appreciate ID assistance will, continue zosyn and follow repeat cultures  -Urine culture grew E. Coli.  -still increasing even with treatment as above, discussed with ID  -lactic acid level elevated 1/10- with this there is concern for the possibility of necrotic bowel although pt has no symptoms c/w this at this time, her sbo is resolving. The prior MD discussed this with pt and son and she voices understanding and states that at this age she does not want to have any surgery done. Also discussed pt with Dr Biagio Quint who states since pt does not want surgery will continue conservative management  -recheck lactic acid leval normalized at 1.2 on 1/11 and leukocytosis continues to decreased to15 on 1/12  -wbc back up to 27 1/13( from 15), and pt with increased cough and some dyspnea>>cxr showed bilateral infiltrates and vanc was added >>wbc 16.8 today 1/14>>discussed with ID today as below and will d/c abx and monitor  Repeat chest x-ray with patchy bilateral infiltrates. Per ID continue to monitor off antibiotics. Continue Tessalon Perles. 7. UTI, Ecoli/enterococcus: both sensitive to zosyn, per ID note on 1/10 - to follow and d/c abx if Pt remaining afebrile and hemodynamically stable.  8.Clostridium perfringens Bacteremia - appreciate ID assistance, repeat cultures with no growth>>per ID 1/10 to follow. Antibiotics discontinued per ID. Per ID follow  9. Sore throat/Dysphonia: Patient developed a sore throat and hoarseness, after reinsertion of displaced NG-T on 05/02/11. Throat appeared clear, without hyperemia or exudate. Managed successfuly with with Chloraseptic spray.  -improved 1/9 s/p NGT removal  10. AKI: Had jump in creatinine from 0.93 to 1.72, overnight on 05/02/12. Responded to iv fluids, and creatinine has normalized  at 0.86 as of 05/03/12- and remains wnl.  11.moderate oral and mild pharyngeal phase  dysphagia: Per modified barium swallow 05/11/2012. Continue dysphagia 3 diet with nectar thick liquids thin water between meals after oral care. Speech therapy following. 12. Diarrhea :  -appeared to decreasing follwing d/c of Colace 1/12, patient with persistent loose stool. Repeat C. difficile PCR is negative. Continue florastor. Will place on Imodium as needed.  -Patient has had C. difficile PCR which came back negative on 1/9  13. Hypernatremia  Resolved, #11 was likely contributing -changed to hypotonic ivf on 1/12, HCTZ was also decreased(now dc'ed)  14. Hypokalemia  -replaced  15.Lung infiltrates - PNA vs Pulm edema  - discussed with ID/Dr Luciana Axe and he agrees that wbc of 27 on 1/13 was likely erroneous and recommends to d/c abx  - Patient given IV lasix 2 days ago. Chest x-ray with patchy bilateral infiltrates. Monitor off antibiotics. 2-D echo with EF of 50-55%. No wall motion abnormalities. Left atrium was mildly dilated. Mildly to moderately fibrotic mitral valve annulus. Follow. ID ff [id number]. Dysphagia- Moderate oral with mild pharyngeal  -dysphagia diet with Nectar-thick liquid (thin water between meals after oral care)      Code Status: DO NOT RESUSCITATE Family Communication: Updated patient and son and daughter in law at bedside. Disposition Plan: SNF   Consultants: Almond Lint, surgeon.  Dr. Luciana Axe, infectious diseases  Procedures: CXR.  Abdominal X-Ray.  X-Ray right foot.  CT Abdomen/pelvis.      Antibiotics: Zoysn>> stopped 1/14  vanc started 1/13>>1/14    HPI/Subjective: Patient states breathing improved. Patient with multiple loose stools per nursing, when she stands up. Patient denies any abdominal pain.  Patient tolerating diet.  Objective: Filed Vitals:   05/12/12 1632 05/12/12 2120 05/13/12 0500 05/13/12 0606  BP: 134/45 117/43  148/54  Pulse:  64  68  Temp:  99 F (37.2 C)  98.8 F (37.1 C)  TempSrc:  Oral  Oral  Resp:  20  18  Height:        Weight:   68.8 kg (151 lb 10.8 oz)   SpO2:  93%  97%    Intake/Output Summary (Last 24 hours) at 05/13/12 1146 Last data filed at 05/13/12 0900  Gross per 24 hour  Intake    243 ml  Output      0 ml  Net    243 ml   Filed Weights   05/01/12 0816 05/12/12 0504 05/13/12 0500  Weight: 67.7 kg (149 lb 4 oz) 67.5 kg (148 lb 13 oz) 68.8 kg (151 lb 10.8 oz)    Exam:   General:  NAD  Cardiovascular: RRR  Respiratory: BIBASILAR CRACKLES, SOME SCATTERED COARSE BS  Abdomen: mILD DISTENSION, NT,+BS  Data Reviewed: Basic Metabolic Panel:  Lab 05/13/12 1610 05/12/12 0450 05/11/12 0505 05/10/12 1649 05/10/12 0444  NA 141 139 137 136 139  K 3.9 3.3* 3.7 3.7 3.2*  CL 110 105 106 104 107  CO2 24 23 22 21 21   GLUCOSE 108* 124* 112* 113* 120*  BUN 19 15 13 13 12   CREATININE 0.80 0.81 0.68 0.75 0.69  CALCIUM 7.8* 8.2* 8.1* 8.3* 8.1*  MG -- -- -- -- --  PHOS -- -- -- -- --   Liver Function Tests: No results found for this basename: AST:5,ALT:5,ALKPHOS:5,BILITOT:5,PROT:5,ALBUMIN:5 in the last 168 hours No results found for this basename: LIPASE:5,AMYLASE:5 in the last 168 hours No results found for this basename: AMMONIA:5 in the last 168 hours CBC:  Lab 05/13/12 0440 05/12/12 0450 05/11/12 0505 05/10/12 0444 05/09/12 0519  WBC 13.1* 16.5* 16.8* 27.5* 15.5*  NEUTROABS 7.9* -- -- -- --  HGB 8.8* 10.0* 8.8* 8.4* 8.8*  HCT 27.7* 31.7* 27.5* 26.6* 27.7*  MCV 86.0 85.0 84.1 84.7 86.3  PLT 335 323 268 256 214   Cardiac Enzymes: No results found for this basename: CKTOTAL:5,CKMB:5,CKMBINDEX:5,TROPONINI:5 in the last 168 hours BNP (last 3 results)  Basename 05/11/12 0505  PROBNP 5916.0*   CBG:  Lab 05/13/12 0735 05/12/12 0731 05/11/12 1709 05/09/12 1642 05/09/12 0733  GLUCAP 93 128* 145* 135* 112*    Recent Results (from the past 240 hour(s))  CULTURE, BLOOD (ROUTINE X 2)     Status: Normal   Collection Time   05/05/12  6:00 PM      Component Value Range Status Comment    Specimen Description BLOOD RIGHT HAND   Final    Special Requests BOTTLES DRAWN AEROBIC AND ANAEROBIC 10CC   Final    Culture  Setup Time 05/05/2012 22:35   Final    Culture NO GROWTH 5 DAYS   Final    Report Status 05/11/2012 FINAL   Final   CULTURE, BLOOD (ROUTINE X 2)     Status: Normal   Collection Time   05/05/12  6:30 PM      Component Value Range Status Comment   Specimen Description BLOOD RIGHT ARM   Final    Special Requests BOTTLES DRAWN AEROBIC AND ANAEROBIC 10CC   Final    Culture  Setup Time 05/05/2012 22:35   Final    Culture NO GROWTH 5 DAYS   Final    Report Status 05/11/2012 FINAL   Final   CLOSTRIDIUM DIFFICILE BY PCR     Status: Normal   Collection Time   05/06/12  2:53 PM      Component Value Range Status Comment   C difficile by pcr NEGATIVE  NEGATIVE Final   CLOSTRIDIUM DIFFICILE BY PCR     Status: Normal   Collection Time   05/13/12  1:30 AM      Component Value Range Status Comment   C difficile by pcr NEGATIVE  NEGATIVE Final      Studies: Dg Chest Port 1 View  05/12/2012  *RADIOLOGY REPORT*  Clinical Data: Pulmonary infiltrates.  PORTABLE CHEST - 1 VIEW  Comparison: 01/13 and 05/01/2012  Findings: There is a faint patchy bilateral pulmonary infiltrates appears slightly more prominent particularly in the left upper and lower lung zones.  Overall heart size and vascularity are normal. Stable pleural calcification and soft tissue density in the left upper lung zone. There is a compression fracture of the superior aspect of T11, age indeterminate.  IMPRESSION: Faint patchy bilateral pulmonary infiltrates appears slightly progressed.   Original Report Authenticated By: Francene Boyers, M.D.     Scheduled Meds:    . atenolol  50 mg Oral BID  . benzonatate  100 mg Oral TID  . lip balm  1 application Topical BID  . loperamide  4 mg Oral Once  . loratadine  10 mg Oral Daily  . pantoprazole  40 mg Oral BID  . potassium chloride  20 mEq Oral BID  . saccharomyces  boulardii  250 mg Oral BID  . sodium chloride  3 mL Intravenous Q12H  . spironolactone  25 mg Oral Daily   Continuous Infusions:    . sodium chloride 20 mL/hr (05/10/12 1717)    Principal Problem:  *SBO (small bowel  obstruction) Active Problems:  Hyponatremia  HTN (hypertension)  Traumatic hematoma of foot  Leukocytosis  Hypokalemia  HAP (hospital-acquired pneumonia)    Time spent: . 30 mins    [facility]  [facility] Pager [phone]. If 8PM-8AM, please contact night-coverage at [email or web address], password [facility] 05/13/2012, 11:46 AM  LOS: 13 days

## 2012-05-13 NOTE — Progress Notes (Signed)
Speech Language Pathology Dysphagia Treatment Patient Details Name: [name] MRN: [id number] DOB: [birth year] Today's Date: 05/13/2012 Time: 4098-1191 SLP Time Calculation (min): 19 min  Assessment / Plan / Recommendation Clinical Impression  Pt seen to educate other son, daughter in law Dorene Sorrow and Carney Bern) and pt to results of swallowing evaluation (MBS )and purpose of compensatory strategies being to minimize amount pt aspirates/maximize airway protection.   Per Carney Bern, pt had previously been coughing during intake, slp advised pt was coughing during test without barium in larynx/trachea, therefore difficult to assess tolerance at bedside.  Pt desires a Farrel Demark today and son Gabriel Rung is reportedly bringing one to her.  Informed pt and family that based on premorbid information receieved from Joe, pt had subtle baseline dysphagia with acute worsening from deconditioning, current medical illness.   Son and pt agreeable to continue dys3/nectar, allowing ice, icecream, popsicle, italian ice with meals.  Also [employer] and diagram provided in writing.  Son, daughter in law and pt verbalized understanding to information provided and are agreeable to plan.   SLP to follow for tolerance and continued education.   Pt would benefit from follow up SLP at next venue of care.     Diet Recommendation  Initiate / Change Diet: Dysphagia 3 (mechanical soft);Nectar-thick liquid (icecream, ice, popsicle ok, frazier water protocol)    SLP Plan Continue with current plan of care   Pertinent Vitals/Pain Low grade temp, decreased, productive cough  Dg Chest Port 1 View  05/12/2012  *RADIOLOGY REPORT*  Clinical Data: Pulmonary infiltrates.  PORTABLE CHEST - 1 VIEW  Comparison: 01/13 and 05/01/2012  Findings: There is a faint patchy bilateral pulmonary infiltrates appears slightly more prominent particularly in the left upper and lower lung zones.  Overall heart size and vascularity are normal. Stable  pleural calcification and soft tissue density in the left upper lung zone. There is a compression fracture of the superior aspect of T11, age indeterminate.  IMPRESSION: Faint patchy bilateral pulmonary infiltrates appears slightly progressed.   Original Report Authenticated By: Francene Boyers, M.D.      Swallowing Goals  SLP Swallowing Goals Swallow Study Goal #2 - Progress: Progressing toward goal  General Temperature Spikes Noted: Yes (low grade temp last pm) Respiratory Status: Room air Behavior/Cognition: Alert;Cooperative;Distractible Oral Cavity - Dentition: Adequate natural dentition Patient Positioning: Upright in chair  Oral Cavity - Oral Hygiene   n/a, pt uses incentive spirometer and expectorating secretions  Dysphagia Treatment Treatment focused on: Patient/family/caregiver education Family/Caregiver Educated: son Dorene Sorrow and his wife Carney Bern Patient observed directly with PO's: No Reason PO's not observed: Other (comment) (education session only with pt/family, pt does not want po) Feeding: Needs assist Liquids provided via: Cup   GO     Donavan Burnet, MS [facility] SLP [phone]

## 2012-05-13 NOTE — Progress Notes (Signed)
CSW spoke with patient, son - [name] & daughter-in-law at bedside re: SNF bed offers. Patient's [facility] for SNF [name] (CSW to check with them in the morning re: bed availability) and [facility] as backup. Anticipating discharge tomorrow.   Clinical Social Work Department CLINICAL SOCIAL WORK PLACEMENT NOTE 05/13/2012  Patient:  [name], [name]  Account Number:  [id number] Admit date:  04/30/2012  Clinical Social Worker:  Orpah Greek  Date/time:  05/13/2012 03:58 PM  Clinical Social Work is seeking post-discharge placement for this patient at the following level of care:   SKILLED NURSING   (*CSW will update this form in Epic as items are completed)   05/06/2012  Patient/family provided with [facility] Department of Clinical Social Work's list of facilities offering this level of care within the geographic area requested by the patient (or if unable, by the patient's family).  05/06/2012  Patient/family informed of their freedom to choose among providers that offer the needed level of care, that participate in Medicare, Medicaid or managed care program needed by the patient, have an available bed and are willing to accept the patient.  05/06/2012  Patient/family informed of [facility]' ownership interest in [facility], as well as of the fact that they are under no obligation to receive care at this facility.  PASARR submitted to EDS on  PASARR number received from EDS on   FL2 transmitted to all facilities in geographic area requested by pt/family on  05/06/2012 FL2 transmitted to all facilities within larger geographic area on   Patient informed that his/her managed care company has contracts with or will negotiate with  certain facilities, including the following:     Patient/family informed of bed offers received:  05/13/2012 Patient chooses bed at  Physician recommends and patient chooses bed at    Patient to be transferred to  on   Patient to  be transferred to facility by   The following physician request were entered in Epic:   Additional Comments:  Unice Bailey, LCSW [facility] Clinical Social Worker cell #: [phone]

## 2012-05-13 NOTE — Progress Notes (Signed)
Physical Therapy Treatment Patient Details Name: RHIANN BOUCHER MRN: [id number] DOB: Sep 14, 1920 Today's Date: 05/13/2012 Time: 4098-1191 PT Time Calculation (min): 16 min  PT Assessment / Plan / Recommendation Comments on Treatment Session  Pt assisted to sitting EOB however reported nausea and lightheadedness and unable to tolerate more than 3 minutes so assisted back to supine.    Follow Up Recommendations  SNF;Supervision/Assistance - 24 hour     Does the patient have the potential to tolerate intense rehabilitation     Barriers to Discharge        Equipment Recommendations  None recommended by PT    Recommendations for Other Services    Frequency     Plan Discharge plan remains appropriate;Frequency remains appropriate    Precautions / Restrictions Precautions Precautions: Fall Precaution Comments: incontinent B/B   Pertinent Vitals/Pain No pain    Mobility  Bed Mobility Bed Mobility: Supine to Sit;Sit to Supine Supine to Sit: 4: Min assist;HOB elevated Sit to Supine: 4: Min assist;HOB elevated Details for Bed Mobility Assistance: verbal cues to encourage movement, assist for trunk upright, pt able to scoot to EOB without assist, pt reported feeling lightheaded and nauseated (encouraged to sit EOB with supervision for 3 minutes due to coughing) and requested return to supine, assist for LEs onto bed Transfers Transfers: Not assessed    Exercises     PT Diagnosis:    PT Problem List:   PT Treatment Interventions:     PT Goals Acute Rehab PT Goals PT Goal: Supine/Side to Sit - Progress: Progressing toward goal PT Goal: Sit to Supine/Side - Progress: Progressing toward goal  Visit Information  Last PT Received On: 05/13/12 Assistance Needed: +2    Subjective Data  Subjective: I'll try.   Cognition  Overall Cognitive Status: Appears within functional limits for tasks assessed/performed Arousal/Alertness: Awake/alert Orientation Level: Appears intact for  tasks assessed Behavior During Session: [facility] for tasks performed    Balance     End of Session PT - End of Session Activity Tolerance: Patient limited by fatigue;Other (comment) (nausea, lightheadedness) Patient left: in bed;with call bell/phone within reach;with bed alarm set   GP     Grae Cannata,KATHrine E 05/13/2012, 4:20 PM Pager: [phone]

## 2012-05-14 LAB — CBC
Hemoglobin: 8.9 g/dL — ABNORMAL LOW (ref 12.0–15.0)
MCH: 27 pg (ref 26.0–34.0)
MCHC: 31.7 g/dL (ref 30.0–36.0)
Platelets: 355 10*3/uL (ref 150–400)
RBC: 3.3 MIL/uL — ABNORMAL LOW (ref 3.87–5.11)

## 2012-05-14 LAB — BASIC METABOLIC PANEL
BUN: 20 mg/dL (ref 6–23)
CO2: 19 mEq/L (ref 19–32)
Calcium: 7.9 mg/dL — ABNORMAL LOW (ref 8.4–10.5)
Glucose, Bld: 165 mg/dL — ABNORMAL HIGH (ref 70–99)
Sodium: 140 mEq/L (ref 135–145)

## 2012-05-14 MED ORDER — OXYCODONE HCL 5 MG PO TABS
5.0000 mg | ORAL_TABLET | ORAL | Status: DC | PRN
  Administered 2012-05-14 – 2012-05-16 (×4): 5 mg via ORAL
  Filled 2012-05-14 (×4): qty 1

## 2012-05-14 MED ORDER — CYCLOBENZAPRINE HCL 5 MG PO TABS
5.0000 mg | ORAL_TABLET | Freq: Three times a day (TID) | ORAL | Status: DC | PRN
  Administered 2012-05-15: 5 mg via ORAL
  Filled 2012-05-14: qty 1

## 2012-05-14 NOTE — Progress Notes (Signed)
[facility] PROGRESS NOTE  [name] XBJ:[id number] DOB: 1920-05-18 DOA: 04/30/2012 PCP: No primary provider on file.  Assessment/Plan:  1. SBO (small bowel obstruction): Patient presented with about 24 hours of abdominal pain, and vomiting. Imaging studies demonstrated small bowel obstruction with transition zone in the anterior central pelvis, associated with bowel wall thickening at the point of obstruction. This could represent an adhesion, though subtle mass lesion is not excluded. There was increased stool throughout colon, and a moderate sized hiatal hernia. Dr Girtha Hake provided surgical consultation and has recommended bowel rest and enemas, NG-T and follow up abdominal films. Clinically, patient appears to be making progress, with decreased NG-T output, and decreased bowel caliber on X-Ray.Continue management per surgeon.  NG-T dc'ed  1/9, +BM  resolved, and pt declined surgical intervention  -diet advanced to regular on 1/11 and she has continued to tolerate it well  2. Hyponatremia: Sodium was 121 at presentation. This is likely secondary to dehydration, as well as pre-admission diuretic therapy. Diuretics are on hold. managing with iv NS, with improvement. sodium has normalized at 141 today.  -Follow recheck.  3. HTN (hypertension): Continue current regimen of atenolol and spironolactone. 4. Painful right foot hematoma: Patient has a hematoma on the dorsum of her right foot about 4 cm in diameter, following a recent fall. Fortunately, no bony injuries on plain films. Clinically, there is no evidence of infection at this time. This is gradually resolving.  5. Acute urinary retention: This occurred overnight on 05/02/12, and was addressed with Foley catheter. Trial of voiding.  6. Leukocytosis: Patient had a high, persistent leukocytosis, although she is afebrile, CXR was devoid of infiltrates and urinalysis was negative. Leukocytosis is starting to trend down, however  fluctuated. Commenced on empiric Zosyn on 05/02/12.  -Blood cultures grew GPR in identified as clostridium perfringens, appreciate ID assistance will, continue zosyn and follow repeat cultures  -Urine culture grew E. Coli.  -still increasing even with treatment as above, discussed with ID  -lactic acid level elevated 1/10- with this there is concern for the possibility of necrotic bowel although pt has no symptoms c/w this at this time, her sbo is resolving. The prior MD discussed this with pt and son and she voices understanding and states that at this age she does not want to have any surgery done. Also discussed pt with Dr Biagio Quint who states since pt does not want surgery will continue conservative management  -recheck lactic acid leval normalized at 1.2 on 1/11 and leukocytosis continues to decreased to15 on 1/12  -wbc back up to 27 1/13( from 15), and pt with increased cough and some dyspnea>>cxr showed bilateral infiltrates and vanc was added >>wbc 16.8 today 1/14>>discussed with ID today as below and will d/c abx and monitor  Repeat chest x-ray with patchy bilateral infiltrates. Per ID continue to monitor off antibiotics. Continue Tessalon Perles. 7. UTI, Ecoli/enterococcus: both sensitive to zosyn, per ID note on 1/10 - to follow and d/c abx if Pt remaining afebrile and hemodynamically stable.  8.Clostridium perfringens Bacteremia - appreciate ID assistance, repeat cultures with no growth>>per ID 1/10 to follow. Antibiotics discontinued per ID. Per ID follow  9. Sore throat/Dysphonia: Patient developed a sore throat and hoarseness, after reinsertion of displaced NG-T on 05/02/11. Throat appeared clear, without hyperemia or exudate. Managed successfuly with with Chloraseptic spray.  -improved 1/9 s/p NGT removal  10. AKI: Had jump in creatinine from 0.93 to 1.72, overnight on 05/02/12. Responded to iv fluids, and creatinine  has normalized at 0.86 as of 05/03/12- and remains wnl.  11.moderate oral and mild  pharyngeal phase dysphagia: Per modified barium swallow 05/11/2012. Continue dysphagia 3 diet with nectar thick liquids thin water between meals after oral care. Speech therapy following. 12. Diarrhea :  -appeared to decreasing follwing d/c of Colace 1/12, patient with persistent loose stool. Repeat C. difficile PCR is negative. Continue florastor. Will place on Imodium as needed.  -Patient has had C. difficile PCR which came back negative on 1/9, 05/13/12 13. Hypernatremia  Resolved, #11 was likely contributing -changed to hypotonic ivf on 1/12, HCTZ was also decreased(now dc'ed)  14. Hypokalemia  -replaced  15.Lung infiltrates - PNA vs Pulm edema  - discussed with ID/Dr Luciana Axe and he agrees that wbc of 27 on 1/13 was likely erroneous and recommends to d/c abx  - Patient given IV lasix 2 days ago. Chest x-ray with patchy bilateral infiltrates. Monitor off antibiotics. 2-D echo with EF of 50-55%. No wall motion abnormalities. Left atrium was mildly dilated. Mildly to moderately fibrotic mitral valve annulus. Follow. ID ff [id number]. Dysphagia- Moderate oral with mild pharyngeal  -dysphagia diet with Nectar-thick liquid (thin water between meals after oral care)      Code Status: DO NOT RESUSCITATE Family Communication: Updated patient and son and daughter in law at bedside. Disposition Plan: SNF   Consultants: Almond Lint, surgeon.  Dr. Luciana Axe, infectious diseases  Procedures: CXR.  Abdominal X-Ray.  X-Ray right foot.  CT Abdomen/pelvis.      Antibiotics: Zoysn>> stopped 1/14  vanc started 1/13>>1/14    HPI/Subjective: Patient states breathing improved. Patient with loose stools per nursing overnight. Patient also complaining of right lower back strain. Patient denies any abdominal pain.  Patient tolerating diet.  Objective: Filed Vitals:   05/13/12 1300 05/13/12 2151 05/14/12 0636 05/14/12 1000  BP: 129/68 159/61 123/49 125/50  Pulse: 69 77 74 70  Temp: 98.6 F (37 C) 99 F  (37.2 C) 99.6 F (37.6 C)   TempSrc: Oral Oral Oral   Resp: 17 18 18    Height:      Weight:   64.6 kg (142 lb 6.7 oz)   SpO2: 98% 99% 96%     Intake/Output Summary (Last 24 hours) at 05/14/12 1123 Last data filed at 05/13/12 2157  Gross per 24 hour  Intake    243 ml  Output      0 ml  Net    243 ml   Filed Weights   05/12/12 0504 05/13/12 0500 05/14/12 0636  Weight: 67.5 kg (148 lb 13 oz) 68.8 kg (151 lb 10.8 oz) 64.6 kg (142 lb 6.7 oz)    Exam:   General:  NAD  Cardiovascular: RRR  Respiratory: BIBASILAR CRACKLES, SOME SCATTERED COARSE BS  Abdomen: mILD DISTENSION, NT,+BS  Data Reviewed: Basic Metabolic Panel:  Lab 05/14/12 4098 05/13/12 0440 05/12/12 0450 05/11/12 0505 05/10/12 1649  NA 140 141 139 137 136  K 3.7 3.9 3.3* 3.7 3.7  CL 110 110 105 106 104  CO2 19 24 23 22 21   GLUCOSE 165* 108* 124* 112* 113*  BUN 20 19 15 13 13   CREATININE 0.64 0.80 0.81 0.68 0.75  CALCIUM 7.9* 7.8* 8.2* 8.1* 8.3*  MG -- -- -- -- --  PHOS -- -- -- -- --   Liver Function Tests: No results found for this basename: AST:5,ALT:5,ALKPHOS:5,BILITOT:5,PROT:5,ALBUMIN:5 in the last 168 hours No results found for this basename: LIPASE:5,AMYLASE:5 in the last 168 hours No results found for this  basename: AMMONIA:5 in the last 168 hours CBC:  Lab 05/14/12 0445 05/13/12 0440 05/12/12 0450 05/11/12 0505 05/10/12 0444  WBC 21.0* 13.1* 16.5* 16.8* 27.5*  NEUTROABS -- 7.9* -- -- --  HGB 8.9* 8.8* 10.0* 8.8* 8.4*  HCT 28.1* 27.7* 31.7* 27.5* 26.6*  MCV 85.2 86.0 85.0 84.1 84.7  PLT 355 335 323 268 256   Cardiac Enzymes: No results found for this basename: CKTOTAL:5,CKMB:5,CKMBINDEX:5,TROPONINI:5 in the last 168 hours BNP (last 3 results)  Basename 05/11/12 0505  PROBNP 5916.0*   CBG:  Lab 05/13/12 1739 05/13/12 0735 05/12/12 0731 05/11/12 1709 05/09/12 1642  GLUCAP 105* 93 128* 145* 135*    Recent Results (from the past 240 hour(s))  CULTURE, BLOOD (ROUTINE X 2)     Status:  Normal   Collection Time   05/05/12  6:00 PM      Component Value Range Status Comment   Specimen Description BLOOD RIGHT HAND   Final    Special Requests BOTTLES DRAWN AEROBIC AND ANAEROBIC 10CC   Final    Culture  Setup Time 05/05/2012 22:35   Final    Culture NO GROWTH 5 DAYS   Final    Report Status 05/11/2012 FINAL   Final   CULTURE, BLOOD (ROUTINE X 2)     Status: Normal   Collection Time   05/05/12  6:30 PM      Component Value Range Status Comment   Specimen Description BLOOD RIGHT ARM   Final    Special Requests BOTTLES DRAWN AEROBIC AND ANAEROBIC 10CC   Final    Culture  Setup Time 05/05/2012 22:35   Final    Culture NO GROWTH 5 DAYS   Final    Report Status 05/11/2012 FINAL   Final   CLOSTRIDIUM DIFFICILE BY PCR     Status: Normal   Collection Time   05/06/12  2:53 PM      Component Value Range Status Comment   C difficile by pcr NEGATIVE  NEGATIVE Final   CLOSTRIDIUM DIFFICILE BY PCR     Status: Normal   Collection Time   05/13/12  1:30 AM      Component Value Range Status Comment   C difficile by pcr NEGATIVE  NEGATIVE Final      Studies: No results found.  Scheduled Meds:    . atenolol  50 mg Oral BID  . benzonatate  100 mg Oral TID  . lip balm  1 application Topical BID  . loratadine  10 mg Oral Daily  . pantoprazole  40 mg Oral BID  . potassium chloride  20 mEq Oral BID  . saccharomyces boulardii  250 mg Oral BID  . sodium chloride  3 mL Intravenous Q12H  . spironolactone  25 mg Oral Daily   Continuous Infusions:    . sodium chloride 20 mL/hr (05/10/12 1717)    Principal Problem:  *SBO (small bowel obstruction) Active Problems:  Hyponatremia  HTN (hypertension)  Traumatic hematoma of foot  Leukocytosis  Hypokalemia  HAP (hospital-acquired pneumonia)  Diarrhea    Time spent: . 30 mins    [facility]  [facility] Pager [phone]. If 8PM-8AM, please contact night-coverage at [email or web address], password [facility] 05/14/2012, 11:23 AM  LOS:  14 days

## 2012-05-14 NOTE — Progress Notes (Signed)
Physical Therapy Treatment Patient Details Name: [name] MRN: [id number] DOB: August 08, 1920 Today's Date: 05/14/2012 Time: 7829-5621 PT Time Calculation (min): 20 min  PT Assessment / Plan / Recommendation Comments on Treatment Session  Pt able to tolerate stand pivot over to recliner today however too fatigued for any other activity.    Follow Up Recommendations  SNF;Supervision/Assistance - 24 hour     Does the patient have the potential to tolerate intense rehabilitation     Barriers to Discharge        Equipment Recommendations  None recommended by PT    Recommendations for Other Services    Frequency     Plan Discharge plan remains appropriate;Frequency remains appropriate    Precautions / Restrictions Precautions Precautions: Fall Precaution Comments: incontinent B/B   Pertinent Vitals/Pain Pt reports minimal back pain, premedicated per pt, repositioned to comfort with pillows in recliner    Mobility  Bed Mobility Bed Mobility: Sit to Supine Sit to Supine: 4: Min assist;HOB elevated Details for Bed Mobility Assistance: verbal cues for technique, assist for lower body Transfers Transfers: Stand to Sit;Sit to Stand;Stand Pivot Transfers Sit to Stand: 4: Min assist;With upper extremity assist;From bed Stand to Sit: 4: Min assist;To chair/3-in-1;To bed Stand Pivot Transfers: 4: Min assist Details for Transfer Assistance: pt performed standing twice for hygiene, verbal cues for hand placement and assist to rise and steady, +2 for hygiene and safety with a few steps over to bed due to pt continuing to feel weak and fatigue Ambulation/Gait Ambulation/Gait Assistance: Not tested (comment)    Exercises     PT Diagnosis:    PT Problem List:   PT Treatment Interventions:     PT Goals Acute Rehab PT Goals PT Goal: Supine/Side to Sit - Progress: Progressing toward goal PT Goal: Sit to Stand - Progress: Progressing toward goal PT Goal: Stand to Sit - Progress:  Progressing toward goal PT Transfer Goal: Bed to Chair/Chair to Bed - Progress: Progressing toward goal  Visit Information  Last PT Received On: 05/14/12 Assistance Needed: +2    Subjective Data  Subjective: I'm so tired.   Cognition  Overall Cognitive Status: Appears within functional limits for tasks assessed/performed Arousal/Alertness: Awake/alert Orientation Level: Appears intact for tasks assessed Behavior During Session: [facility] for tasks performed    Balance     End of Session PT - End of Session Activity Tolerance: Patient limited by fatigue;Other (comment) (continues to report nausea with mobility) Patient left: in chair;with call bell/phone within reach;with chair alarm set;with family/visitor present   GP     Thera Basden,KATHrine E 05/14/2012, 2:23 PM Pager: [phone]

## 2012-05-14 NOTE — Progress Notes (Signed)
    [facility]  Date of Admission:  04/30/2012  Antibiotics: Continues off antibiotics  Subjective: No acute events  Objective: Temp:  [97.3 F (36.3 C)-99.6 F (37.6 C)] 97.3 F (36.3 C) (01/17 1418) Pulse Rate:  [70-77] 76  (01/17 1418) Resp:  [16-18] 16  (01/17 1418) BP: (122-159)/(49-61) 122/54 mmHg (01/17 1418) SpO2:  [93 %-99 %] 93 % (01/17 1418) Weight:  [142 lb 6.7 oz (64.6 kg)] 142 lb 6.7 oz (64.6 kg) (01/17 0636)  General: Awake, alert, nad CV: RRR without m/r/g Abd: soft, mildly distended   Lab Results Lab Results  Component Value Date   WBC 21.0* 05/14/2012   HGB 8.9* 05/14/2012   HCT 28.1* 05/14/2012   MCV 85.2 05/14/2012   PLT 355 05/14/2012    Lab Results  Component Value Date   CREATININE 0.64 05/14/2012   BUN 20 05/14/2012   NA 140 05/14/2012   K 3.7 05/14/2012   CL 110 05/14/2012   CO2 19 05/14/2012    Lab Results  Component Value Date   ALT 13 05/03/2012   AST 38* 05/03/2012   ALKPHOS 70 05/03/2012   BILITOT 0.8 05/03/2012      Microbiology: Recent Results (from the past 240 hour(s))  CULTURE, BLOOD (ROUTINE X 2)     Status: Normal   Collection Time   05/05/12  6:00 PM      Component Value Range Status Comment   Specimen Description BLOOD RIGHT HAND   Final    Special Requests BOTTLES DRAWN AEROBIC AND ANAEROBIC 10CC   Final    Culture  Setup Time 05/05/2012 22:35   Final    Culture NO GROWTH 5 DAYS   Final    Report Status 05/11/2012 FINAL   Final   CULTURE, BLOOD (ROUTINE X 2)     Status: Normal   Collection Time   05/05/12  6:30 PM      Component Value Range Status Comment   Specimen Description BLOOD RIGHT ARM   Final    Special Requests BOTTLES DRAWN AEROBIC AND ANAEROBIC 10CC   Final    Culture  Setup Time 05/05/2012 22:35   Final    Culture NO GROWTH 5 DAYS   Final    Report Status 05/11/2012 FINAL   Final   CLOSTRIDIUM DIFFICILE BY PCR     Status: Normal   Collection Time   05/06/12  2:53 PM      Component Value  Range Status Comment   C difficile by pcr NEGATIVE  NEGATIVE Final   CLOSTRIDIUM DIFFICILE BY PCR     Status: Normal   Collection Time   05/13/12  1:30 AM      Component Value Range Status Comment   C difficile by pcr NEGATIVE  NEGATIVE Final     Studies/Results: No results found.  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  Stable.  2) leukocytosis - continues to fluctuate but no fever and no overt signs of infection.   -continue to observe off of antibiotics. -I will sign off, call for any clinical changes   Staci Righter, MD [facility] [phone] pager   05/14/2012, 2:21 PM

## 2012-05-15 LAB — CBC WITH DIFFERENTIAL/PLATELET
Basophils Relative: 0 % (ref 0–1)
Eosinophils Relative: 0 % (ref 0–5)
Hemoglobin: 9 g/dL — ABNORMAL LOW (ref 12.0–15.0)
Lymphs Abs: 2 10*3/uL (ref 0.7–4.0)
MCH: 26.9 pg (ref 26.0–34.0)
MCHC: 31.4 g/dL (ref 30.0–36.0)
MCV: 85.7 fL (ref 78.0–100.0)
Monocytes Absolute: 5.8 10*3/uL — ABNORMAL HIGH (ref 0.1–1.0)
Platelets: 386 10*3/uL (ref 150–400)
RBC: 3.35 MIL/uL — ABNORMAL LOW (ref 3.87–5.11)

## 2012-05-15 LAB — BASIC METABOLIC PANEL
BUN: 28 mg/dL — ABNORMAL HIGH (ref 6–23)
CO2: 20 mEq/L (ref 19–32)
Calcium: 8.3 mg/dL — ABNORMAL LOW (ref 8.4–10.5)
Glucose, Bld: 124 mg/dL — ABNORMAL HIGH (ref 70–99)
Sodium: 139 mEq/L (ref 135–145)

## 2012-05-15 NOTE — Progress Notes (Signed)
[facility] PROGRESS NOTE  [name] AVW:[id number] DOB: August 19, 1920 DOA: 04/30/2012 PCP: No primary provider on file.  Assessment/Plan:  1. SBO (small bowel obstruction): Patient presented with about 24 hours of abdominal pain, and vomiting. Imaging studies demonstrated small bowel obstruction with transition zone in the anterior central pelvis, associated with bowel wall thickening at the point of obstruction. This could represent an adhesion, though subtle mass lesion is not excluded. There was increased stool throughout colon, and a moderate sized hiatal hernia. Dr Girtha Hake provided surgical consultation and has recommended bowel rest and enemas, NG-T and follow up abdominal films. Clinically, patient appears to be making progress, with decreased NG-T output, and decreased bowel caliber on X-Ray.Continue management per surgeon.  NG-T dc'ed  1/9, +BM  resolved, and pt declined surgical intervention  -diet advanced to regular on 1/11 and she has continued to tolerate it well  2. Hyponatremia: Sodium was 121 at presentation. This is likely secondary to dehydration, as well as pre-admission diuretic therapy. Diuretics are on hold. managing with iv NS, with improvement. sodium has normalized.  -Follow recheck.  3. HTN (hypertension): Continue current regimen of atenolol and spironolactone. 4. Painful right foot hematoma: Patient has a hematoma on the dorsum of her right foot about 4 cm in diameter, following a recent fall. Fortunately, no bony injuries on plain films. Clinically, there is no evidence of infection at this time. This is gradually resolving.  5. Acute urinary retention: This occurred overnight on 05/02/12, and was addressed with Foley catheter. Trial of voiding successful. Follow.  6. Leukocytosis: Patient had a high, persistent leukocytosis, although she is afebrile, CXR was devoid of infiltrates and urinalysis was negative. Leukocytosis is starting to trend down, however  fluctuated. Commenced on empiric Zosyn on 05/02/12.  -Blood cultures grew GPR in identified as clostridium perfringens, appreciate ID assistance will, continue zosyn and follow repeat cultures  -Urine culture grew E. Coli.  -still increasing even with treatment as above, discussed with ID  -lactic acid level elevated 1/10- with this there is concern for the possibility of necrotic bowel although pt has no symptoms c/w this at this time, her sbo is resolving. The prior MD discussed this with pt and son and she voices understanding and states that at this age she does not want to have any surgery done. Also discussed pt with Dr Biagio Quint who states since pt does not want surgery will continue conservative management  -recheck lactic acid leval normalized at 1.2 on 1/11 and leukocytosis continues to decreased to15 on 1/12  -wbc back up to 27 1/13( from 15), and pt with increased cough and some dyspnea>>cxr showed bilateral infiltrates and vanc was added >>wbc 16.8 today 1/14>>discussed with ID today as below and will d/c abx and monitor  Repeat chest x-ray with patchy bilateral infiltrates. Per ID continue to monitor off antibiotics. Continue Tessalon Perles. 7. UTI, Ecoli/enterococcus: both sensitive to zosyn, per ID note on 1/10 - to follow and d/c abx if Pt remaining afebrile and hemodynamically stable.  8.Clostridium perfringens Bacteremia - appreciate ID assistance, repeat cultures with no growth>>per ID 1/10 to follow. Antibiotics discontinued per ID. Per ID follow  9. Sore throat/Dysphonia: Patient developed a sore throat and hoarseness, after reinsertion of displaced NG-T on 05/02/11. Throat appeared clear, without hyperemia or exudate. Managed successfuly with with Chloraseptic spray.  -improved 1/9 s/p NGT removal  10. AKI: Had jump in creatinine from 0.93 to 1.72, overnight on 05/02/12. Responded to iv fluids, and creatinine has  normalized at 0.86 as of 05/03/12- and remains wnl.  11.moderate oral and mild  pharyngeal phase dysphagia: Per modified barium swallow 05/11/2012. Continue dysphagia 3 diet with nectar thick liquids thin water between meals after oral care. Speech therapy following. 12. Diarrhea :  -appeared to decreasing follwing d/c of Colace 1/12, patient with persistent loose stool slowly improving. Repeat C. difficile PCR is negative. Continue florastor. Continue Imodium as needed.  -Patient has had C. difficile PCR which came back negative on 1/9, 05/13/12 13. Hypernatremia  Resolved, #11 was likely contributing -changed to hypotonic ivf on 1/12, HCTZ was also decreased(now dc'ed)  14. Hypokalemia  -replaced  15.Lung infiltrates - PNA vs Pulm edema  - discussed with ID/Dr Luciana Axe and he agrees that wbc of 27 on 1/13 was likely erroneous and recommends to d/c abx  - Patient given IV lasix 2 days ago. Chest x-ray with patchy bilateral infiltrates. Monitor off antibiotics. 2-D echo with EF of 50-55%. No wall motion abnormalities. Left atrium was mildly dilated. Mildly to moderately fibrotic mitral valve annulus. Follow. ID ff [id number]. Dysphagia- Moderate oral with mild pharyngeal  - Continue dysphagia diet with Nectar-thick liquid (thin water between meals after oral care)      Code Status: DO NOT RESUSCITATE Family Communication: Updated patient and son and daughter in law at bedside. Disposition Plan: SNF   Consultants: Almond Lint, surgeon.  Dr. Luciana Axe, infectious diseases  Procedures: CXR.  Abdominal X-Ray.  X-Ray right foot.  CT Abdomen/pelvis.      Antibiotics: Zoysn>> stopped 1/14  vanc started 1/13>>1/14    HPI/Subjective: Patient states breathing improved. Patient with improving loose stools. Patient also complaining of right lower back strain. Patient denies any abdominal pain.  Patient tolerating diet.  Objective: Filed Vitals:   05/14/12 1000 05/14/12 1418 05/14/12 2106 05/15/12 0509  BP: 125/50 122/54 135/42 132/50  Pulse: 70 76 78 69  Temp:  97.3 F  (36.3 C) 97.3 F (36.3 C) 98.8 F (37.1 C)  TempSrc:  Oral Oral Oral  Resp:  16 18 18   Height:      Weight:    64.8 kg (142 lb 13.7 oz)  SpO2:  93% 96% 95%    Intake/Output Summary (Last 24 hours) at 05/15/12 1217 Last data filed at 05/15/12 0530  Gross per 24 hour  Intake    123 ml  Output    450 ml  Net   -327 ml   Filed Weights   05/13/12 0500 05/14/12 0636 05/15/12 0509  Weight: 68.8 kg (151 lb 10.8 oz) 64.6 kg (142 lb 6.7 oz) 64.8 kg (142 lb 13.7 oz)    Exam:   General:  NAD  Cardiovascular: RRR  Respiratory: SOME SCATTERED COARSE BS  Abdomen: mILD DISTENSION, NT,+BS  Data Reviewed: Basic Metabolic Panel:  Lab 05/15/12 1610 05/14/12 0445 05/13/12 0440 05/12/12 0450 05/11/12 0505  NA 139 140 141 139 137  K 3.9 3.7 3.9 3.3* 3.7  CL 108 110 110 105 106  CO2 20 19 24 23 22   GLUCOSE 124* 165* 108* 124* 112*  BUN 28* 20 19 15 13   CREATININE 0.73 0.64 0.80 0.81 0.68  CALCIUM 8.3* 7.9* 7.8* 8.2* 8.1*  MG -- -- -- -- --  PHOS -- -- -- -- --   Liver Function Tests: No results found for this basename: AST:5,ALT:5,ALKPHOS:5,BILITOT:5,PROT:5,ALBUMIN:5 in the last 168 hours No results found for this basename: LIPASE:5,AMYLASE:5 in the last 168 hours No results found for this basename: AMMONIA:5 in the last 168  hours CBC:  Lab 05/15/12 0550 05/14/12 0445 05/13/12 0440 05/12/12 0450 05/11/12 0505  WBC 19.9* 21.0* 13.1* 16.5* 16.8*  NEUTROABS 12.1* -- 7.9* -- --  HGB 9.0* 8.9* 8.8* 10.0* 8.8*  HCT 28.7* 28.1* 27.7* 31.7* 27.5*  MCV 85.7 85.2 86.0 85.0 84.1  PLT 386 355 335 323 268   Cardiac Enzymes: No results found for this basename: CKTOTAL:5,CKMB:5,CKMBINDEX:5,TROPONINI:5 in the last 168 hours BNP (last 3 results)  Basename 05/11/12 0505  PROBNP 5916.0*   CBG:  Lab 05/15/12 0748 05/14/12 1826 05/13/12 1739 05/13/12 0735 05/12/12 0731  GLUCAP 98 109* 105* 93 128*    Recent Results (from the past 240 hour(s))  CULTURE, BLOOD (ROUTINE X 2)     Status:  Normal   Collection Time   05/05/12  6:00 PM      Component Value Range Status Comment   Specimen Description BLOOD RIGHT HAND   Final    Special Requests BOTTLES DRAWN AEROBIC AND ANAEROBIC 10CC   Final    Culture  Setup Time 05/05/2012 22:35   Final    Culture NO GROWTH 5 DAYS   Final    Report Status 05/11/2012 FINAL   Final   CULTURE, BLOOD (ROUTINE X 2)     Status: Normal   Collection Time   05/05/12  6:30 PM      Component Value Range Status Comment   Specimen Description BLOOD RIGHT ARM   Final    Special Requests BOTTLES DRAWN AEROBIC AND ANAEROBIC 10CC   Final    Culture  Setup Time 05/05/2012 22:35   Final    Culture NO GROWTH 5 DAYS   Final    Report Status 05/11/2012 FINAL   Final   CLOSTRIDIUM DIFFICILE BY PCR     Status: Normal   Collection Time   05/06/12  2:53 PM      Component Value Range Status Comment   C difficile by pcr NEGATIVE  NEGATIVE Final   CLOSTRIDIUM DIFFICILE BY PCR     Status: Normal   Collection Time   05/13/12  1:30 AM      Component Value Range Status Comment   C difficile by pcr NEGATIVE  NEGATIVE Final      Studies: No results found.  Scheduled Meds:    . atenolol  50 mg Oral BID  . benzonatate  100 mg Oral TID  . lip balm  1 application Topical BID  . loratadine  10 mg Oral Daily  . pantoprazole  40 mg Oral BID  . potassium chloride  20 mEq Oral BID  . saccharomyces boulardii  250 mg Oral BID  . sodium chloride  3 mL Intravenous Q12H  . spironolactone  25 mg Oral Daily   Continuous Infusions:    . sodium chloride 20 mL/hr (05/10/12 1717)    Principal Problem:  *SBO (small bowel obstruction) Active Problems:  Hyponatremia  HTN (hypertension)  Traumatic hematoma of foot  Leukocytosis  Hypokalemia  HAP (hospital-acquired pneumonia)  Diarrhea    Time spent: .> 30 mins    [facility]  [facility] Pager [phone]. If 8PM-8AM, please contact night-coverage at [email or web address], password [facility] 05/15/2012, 12:17 PM  LOS:  15 days

## 2012-05-16 LAB — GLUCOSE, CAPILLARY
Glucose-Capillary: 80 mg/dL (ref 70–99)
Glucose-Capillary: 120 mg/dL — ABNORMAL HIGH (ref 70–99)

## 2012-05-16 LAB — BASIC METABOLIC PANEL
CO2: 19 mEq/L (ref 19–32)
Glucose, Bld: 108 mg/dL — ABNORMAL HIGH (ref 70–99)
Potassium: 4 mEq/L (ref 3.5–5.1)
Sodium: 138 mEq/L (ref 135–145)

## 2012-05-16 NOTE — Progress Notes (Signed)
[facility] PROGRESS NOTE  [name] UKG:[id number] DOB: [birth year] DOA: 04/30/2012 PCP: No primary provider on file.  Assessment/Plan:  1. SBO (small bowel obstruction): Patient presented with about 24 hours of abdominal pain, and vomiting. Imaging studies demonstrated small bowel obstruction with transition zone in the anterior central pelvis, associated with bowel wall thickening at the point of obstruction. This could represent an adhesion, though subtle mass lesion is not excluded. There was increased stool throughout colon, and a moderate sized hiatal hernia. Dr Girtha Hake provided surgical consultation and has recommended bowel rest and enemas, NG-T and follow up abdominal films. Clinically, patient appears to be making progress, with decreased NG-T output, and decreased bowel caliber on X-Ray.Continue management per surgeon.  NG-T dc'ed  1/9, +BM  resolved, and pt declined surgical intervention  -diet advanced to regular on 1/11 and she has continued to tolerate it well  2. Hyponatremia: Sodium was 121 at presentation. This is likely secondary to dehydration, as well as pre-admission diuretic therapy. Diuretics are on hold. managing with iv NS, with improvement. sodium has normalized.  -Follow recheck.  3. HTN (hypertension): Continue current regimen of atenolol and spironolactone. 4. Painful right foot hematoma: Patient has a hematoma on the dorsum of her right foot about 4 cm in diameter, following a recent fall. Fortunately, no bony injuries on plain films. Clinically, there is no evidence of infection at this time. This is gradually resolving.  5. Acute urinary retention: This occurred overnight on 05/02/12, and was addressed with Foley catheter. Trial of voiding successful. Follow.  6. Leukocytosis: Patient had a high, persistent leukocytosis, although she is afebrile, CXR was devoid of infiltrates and urinalysis was negative. Leukocytosis is starting to trend down, however  fluctuated. Commenced on empiric Zosyn on 05/02/12.  -Blood cultures grew GPR in identified as clostridium perfringens, appreciate ID assistance will, continue zosyn and follow repeat cultures  -Urine culture grew E. Coli.  -still increasing even with treatment as above, discussed with ID  -lactic acid level elevated 1/10- with this there is concern for the possibility of necrotic bowel although pt has no symptoms c/w this at this time, her sbo is resolving. The prior MD discussed this with pt and son and she voices understanding and states that at this age she does not want to have any surgery done. Also discussed pt with Dr Biagio Quint who states since pt does not want surgery will continue conservative management  -recheck lactic acid leval normalized at 1.2 on 1/11 and leukocytosis continues to decreased to15 on 1/12  -wbc back up to 27 1/13( from 15), and pt with increased cough and some dyspnea>>cxr showed bilateral infiltrates and vanc was added >>wbc 19.9 yesterday 1/18>>discussed with ID today as below and will d/c abx and monitor  Repeat chest x-ray with patchy bilateral infiltrates. Per ID continue to monitor off antibiotics. Continue Tessalon Perles. 7. UTI, Ecoli/enterococcus: both sensitive to zosyn, per ID note on 1/10 - to follow and d/c abx if Pt remaining afebrile and hemodynamically stable.  8.Clostridium perfringens Bacteremia - appreciate ID assistance, repeat cultures with no growth>>per ID 1/10 to follow. Antibiotics discontinued per ID. Per ID follow  9. Sore throat/Dysphonia: Patient developed a sore throat and hoarseness, after reinsertion of displaced NG-T on 05/02/11. Throat appeared clear, without hyperemia or exudate. Managed successfuly with with Chloraseptic spray.  -improved 1/9 s/p NGT removal  10. AKI: Had jump in creatinine from 0.93 to 1.72, overnight on 05/02/12. Responded to iv fluids, and creatinine has  normalized at 0.86 as of 05/03/12- and remains wnl.  11.moderate oral and  mild pharyngeal phase dysphagia: Per modified barium swallow 05/11/2012. Continue dysphagia 3 diet with nectar thick liquids thin water between meals after oral care. Speech therapy following. 12. Diarrhea :  -appeared to decreasing follwing d/c of Colace 1/12, patient with persistent loose stool slowly improving. Repeat C. difficile PCR is negative. Continue florastor. Continue Imodium as needed.  -Patient has had C. difficile PCR which came back negative on 1/9, 05/13/12 13. Hypernatremia  Resolved, #11 was likely contributing -changed to hypotonic ivf on 1/12, HCTZ was also decreased(now dc'ed)  14. Hypokalemia  -replaced  15.Lung infiltrates - PNA vs Pulm edema  - discussed with ID/Dr Luciana Axe and he agrees that wbc of 27 on 1/13 was likely erroneous and recommends to d/c abx  - Patient given IV lasix 2 days ago. Chest x-ray with patchy bilateral infiltrates. Monitor off antibiotics. 2-D echo with EF of 50-55%. No wall motion abnormalities. Left atrium was mildly dilated. Mildly to moderately fibrotic mitral valve annulus. Follow. ID ff [id number]. Dysphagia- Moderate oral with mild pharyngeal  - Continue dysphagia 3 diet with Nectar-thick liquid (thin water between meals after oral care)      Code Status: DO NOT RESUSCITATE Family Communication: Updated patient and son and daughter in law at bedside. Disposition Plan: SNF   Consultants: Almond Lint, surgeon.  Dr. Luciana Axe, infectious diseases  Procedures: CXR.  Abdominal X-Ray.  X-Ray right foot.  CT Abdomen/pelvis.      Antibiotics: Zoysn>> stopped 1/14  vanc started 1/13>>1/14    HPI/Subjective: Patient states breathing improved. Patient with improving loose stools. Patient also complaining of right lower back strain. Patient denies any abdominal pain.  Patient tolerating diet.  Objective: Filed Vitals:   05/15/12 1350 05/15/12 2102 05/16/12 0511 05/16/12 1327  BP: 139/53 147/51 162/56 138/46  Pulse: 66 68 60 68  Temp: 98.5  F (36.9 C) 98.8 F (37.1 C) 97.8 F (36.6 C) 98 F (36.7 C)  TempSrc: Oral Oral Oral Oral  Resp: 16 18 20 18   Height:      Weight:   66 kg (145 lb 8.1 oz)   SpO2: 100% 100% 98% 99%    Intake/Output Summary (Last 24 hours) at 05/16/12 1532 Last data filed at 05/16/12 1342  Gross per 24 hour  Intake    920 ml  Output    200 ml  Net    720 ml   Filed Weights   05/14/12 0636 05/15/12 0509 05/16/12 0511  Weight: 64.6 kg (142 lb 6.7 oz) 64.8 kg (142 lb 13.7 oz) 66 kg (145 lb 8.1 oz)    Exam:   General:  NAD  Cardiovascular: RRR  Respiratory: SOME SCATTERED COARSE BS  Abdomen: mILD DISTENSION, NT,+BS  Data Reviewed: Basic Metabolic Panel:  Lab 05/16/12 1610 05/15/12 0550 05/14/12 0445 05/13/12 0440 05/12/12 0450  NA 138 139 140 141 139  K 4.0 3.9 3.7 3.9 3.3*  CL 109 108 110 110 105  CO2 19 20 19 24 23   GLUCOSE 108* 124* 165* 108* 124*  BUN 24* 28* 20 19 15   CREATININE 0.64 0.73 0.64 0.80 0.81  CALCIUM 8.3* 8.3* 7.9* 7.8* 8.2*  MG -- -- -- -- --  PHOS -- -- -- -- --   Liver Function Tests: No results found for this basename: AST:5,ALT:5,ALKPHOS:5,BILITOT:5,PROT:5,ALBUMIN:5 in the last 168 hours No results found for this basename: LIPASE:5,AMYLASE:5 in the last 168 hours No results found for this basename:  AMMONIA:5 in the last 168 hours CBC:  Lab 05/15/12 0550 05/14/12 0445 05/13/12 0440 05/12/12 0450 05/11/12 0505  WBC 19.9* 21.0* 13.1* 16.5* 16.8*  NEUTROABS 12.1* -- 7.9* -- --  HGB 9.0* 8.9* 8.8* 10.0* 8.8*  HCT 28.7* 28.1* 27.7* 31.7* 27.5*  MCV 85.7 85.2 86.0 85.0 84.1  PLT 386 355 335 323 268   Cardiac Enzymes: No results found for this basename: CKTOTAL:5,CKMB:5,CKMBINDEX:5,TROPONINI:5 in the last 168 hours BNP (last 3 results)  Basename 05/11/12 0505  PROBNP 5916.0*   CBG:  Lab 05/16/12 0742 05/15/12 1649 05/15/12 0748 05/14/12 1826 05/13/12 1739  GLUCAP 80 105* 98 109* 105*    Recent Results (from the past 240 hour(s))  CLOSTRIDIUM  DIFFICILE BY PCR     Status: Normal   Collection Time   05/13/12  1:30 AM      Component Value Range Status Comment   C difficile by pcr NEGATIVE  NEGATIVE Final      Studies: No results found.  Scheduled Meds:    . atenolol  50 mg Oral BID  . benzonatate  100 mg Oral TID  . lip balm  1 application Topical BID  . loratadine  10 mg Oral Daily  . pantoprazole  40 mg Oral BID  . potassium chloride  20 mEq Oral BID  . saccharomyces boulardii  250 mg Oral BID  . sodium chloride  3 mL Intravenous Q12H  . spironolactone  25 mg Oral Daily   Continuous Infusions:    . sodium chloride 20 mL/hr (05/10/12 1717)    Principal Problem:  *SBO (small bowel obstruction) Active Problems:  Hyponatremia  HTN (hypertension)  Traumatic hematoma of foot  Leukocytosis  Hypokalemia  HAP (hospital-acquired pneumonia)  Diarrhea    Time spent: .> 30 mins    [facility]  [facility] Pager [phone]. If 8PM-8AM, please contact night-coverage at [email or web address], password [facility] 05/16/2012, 3:32 PM  LOS: 16 days

## 2012-05-16 NOTE — Plan of Care (Signed)
Problem: Discharge Progression Outcomes Goal: Tolerating diet Poor appetite, able to tolerate nectar and magic cup

## 2012-05-16 NOTE — Progress Notes (Signed)
Family states patient received living better with heart failure booklet

## 2012-05-17 LAB — GLUCOSE, CAPILLARY: Glucose-Capillary: 112 mg/dL — ABNORMAL HIGH (ref 70–99)

## 2012-05-17 LAB — BASIC METABOLIC PANEL
CO2: 20 mEq/L (ref 19–32)
Chloride: 106 mEq/L (ref 96–112)
Potassium: 4.3 mEq/L (ref 3.5–5.1)
Sodium: 136 mEq/L (ref 135–145)

## 2012-05-17 MED ORDER — HYDROCODONE-ACETAMINOPHEN 5-325 MG PO TABS: 1.0000 | ORAL_TABLET | Freq: Four times a day (QID) | ORAL | Status: DC | PRN

## 2012-05-17 MED ORDER — SACCHAROMYCES BOULARDII 250 MG PO CAPS: 250.0000 mg | ORAL_CAPSULE | Freq: Two times a day (BID) | ORAL | Status: DC

## 2012-05-17 MED ORDER — STARCH (THICKENING) PO POWD: ORAL | Status: DC

## 2012-05-17 MED ORDER — SPIRONOLACTONE 25 MG PO TABS: 25.0000 mg | ORAL_TABLET | Freq: Every day | ORAL | Status: DC

## 2012-05-17 MED ORDER — PANTOPRAZOLE SODIUM 40 MG PO TBEC: 40.0000 mg | DELAYED_RELEASE_TABLET | Freq: Two times a day (BID) | ORAL | Status: DC

## 2012-05-17 MED ORDER — CYCLOBENZAPRINE HCL 5 MG PO TABS: 5.0000 mg | ORAL_TABLET | Freq: Three times a day (TID) | ORAL | Status: DC | PRN

## 2012-05-17 MED ORDER — LOPERAMIDE HCL 2 MG PO CAPS: 2.0000 mg | ORAL_CAPSULE | ORAL | Status: DC | PRN

## 2012-05-17 NOTE — Progress Notes (Signed)
CSW received call from [name] - son is there to complete admission paperwork. [facility] called for transport. Discharge packet is in [place] with AVS, FL2, Prescriptions & chart copy.   Unice Bailey, LCSW [facility] Clinical Social Worker cell #: [phone]

## 2012-05-17 NOTE — Discharge Summary (Signed)
Physician Discharge Summary  BEOLA VASALLO HQI:[id number] DOB: 31-Aug-1920 DOA: 04/30/2012  PCP: No primary provider on file.  Admit date: 04/30/2012 Discharge date: 05/17/2012  Time spent: 70 minutes  Recommendations for Outpatient Follow-up:  1. Patient is to followup with M.D. at the skilled nursing facility. Patient will need a basic metabolic profile done one week post discharge to followup on electrolytes and renal function.  Discharge Diagnoses:  Principal Problem:  *SBO (small bowel obstruction) Active Problems:  Hyponatremia  HTN (hypertension)  Traumatic hematoma of foot  Leukocytosis  Hypokalemia  HAP (hospital-acquired pneumonia)  Diarrhea   Discharge Condition: Stable and improved  Diet recommendation: Dysphagia 3 diet with nectar thick liquids and thin liquids between meals and after oral care.  Filed Weights   05/15/12 0509 05/16/12 0511 05/17/12 0554  Weight: 64.8 kg (142 lb 13.7 oz) 66 kg (145 lb 8.1 oz) 66.9 kg (147 lb 7.8 oz)    History of present illness:  [name] is a 79 y.o. female  has a past medical history of Hypertension and Irregular heart beat.  Presented stating Yesterday afternoon she started to have abdominal pain and nausea with vomiting. Her last BM was this AM. Currently her abdominal pain improved. She has hx of chronic constipations. No fever or chills. No diarrhea. She have had some URI symptoms all last week. She had not have a colonoscopy done in her life.  Surgery has been consulted and will see her in am. He tube has been ordered.    Hospital Course:  1. SBO (small bowel obstruction): Patient presented with about 24 hours of abdominal pain, and vomiting. Imaging studies demonstrated small bowel obstruction with transition zone in the anterior central pelvis, associated with bowel wall thickening at the point of obstruction. This could represent an adhesion, though subtle mass lesion is not excluded. There was increased stool  throughout colon, and a moderate sized hiatal hernia. Dr Girtha Hake provided surgical consultation and had recommended bowel rest and enemas, NG-T and follow up abdominal films. Patient improved clinically during the hospitalization with decreased NG tube output and decreased bowel caliber per x-ray. NG tube was subsequently discontinued on 05/06/2012. Patient was also noted to have a bowel movement then. Patient was subsequently set was started on a diet and patient declined any surgical intervention. Patient tolerated her diet well and her small bowel obstruction had resolved by day of discharge. Patient be discharged in stable and improved condition.  2. Hyponatremia:  On admission patient had presented with a sodium level of 121. It was felt this was likely secondary to hypovolemic hyponatremia secondary to dehydration in the setting of diuretic therapy. Patient's HCTZ was discontinued and her diuretics were held. Patient was hydrated with IV fluids with improvement of her hyponatremia. Patient's hyponatremia had resolved by day of discharge. Patient was discharged in stable and improved condition. 3. HTN (hypertension):  On admission due to patient's hyponatremia patient's HCTZ was discontinued. Patient's ARB was also held. Patient's blood pressure was maintained on atenolol as prolactin. Patient will be discharged on these. If further blood pressure control is needed may consider resuming patient's ARB. Patient be discharged in stable and improved condition. 4. Painful right foot hematoma: Patient has a hematoma on the dorsum of her right foot about 4 cm in diameter, following a recent fall. Fortunately, no bony injuries on plain films. Clinically, there is no evidence of infection at this time. This is gradually resolving. Followup with PCP as outpatient. 5. Acute urinary  retention:  During hospitalization patient was noted to go in acute urinary retention. A Foley catheter was placed. Patient  subsequently underwent a voiding trial which was successful. Patient will be discharged in stable and improved condition. 6. Leukocytosis:  Patient had a high, persistent leukocytosis, although she is afebrile, CXR was devoid of infiltrates and urinalysis was negative. Leukocytosis is starting to trend down, however fluctuated. Patient was started empirically on IV Zosyn on 05/02/2012. Blood cultures which were done grew  GPR as identified as clostridium perfringens. An ID consultation was obtained and patient was seen in consultation with infectious diseases and repeat cultures obtained. Urine cultures also grew an Escherichia coli. Lactic acid level which was obtained did come back elevated however due to patient's resolving small bowel obstruction as well as no symptoms he was monitored. Patient refused any surgical intervention and wanted only conservative treatment. Repeat lactic acid level was obtained which have normalized to 1.2 on 05/08/2012. Patient's leukocytosis fluctuating and went up as high as 27,000. Patient did have some increased cough and some shortness of breath chest x-ray was concerning for bilateral infiltrates. IV vancomycin was added to IV Zosyn and patient was placed on mucolytics and followed. Patient was also given a dose of IV Lasix. C. difficile PCR was also obtained which was negative x2.Patient was seen in consultation per infectious diseases who felt isolated bacteremia was not known to be pathogenic. After approximately 6 days of IV antibiotics it was recommended by infectious diseases to observation off antibiotics. Patient remained afebrile with no overt signs of infection. Patient improved clinically and will be discharged in stable and improved condition. appreciate ID assistance will, continue zosyn and follow repeat cultures   7. UTI, Ecoli/enterococcus: Patient was noted to have a urinary tract infection with cultures growing Escherichia coli/enterococcus. Patient had  been placed empirically on IV Zosyn and bolus bacteria was sensitive to Zosyn. Patient received adequate treatment of approximately 6 days of antibiotic therapy and was recommended per ID to discontinue antibiotics after 6 days as patient had been adequately treated. Patient remained afebrile. Patient will be discharged in stable and improved condition.  8.Clostridium perfringens Bacteremia -  During the patient's workup in the hospitalization blood cultures initially didn't grow Clostridium profundas bacteremia. A ID consultation was obtained and it was felt that patient's as per bacteremia was not known to be pathogenic. Patient had been treated with approximately 6 days of IV antibiotics including vancomycin and Zosyn. Repeat blood cultures were obtained with no growth. Antibiotics were subsequently discontinued per ID and patient remained afebrile and remained in stable and improved condition. 9. Sore throat/Dysphonia: Patient developed a sore throat and hoarseness, after reinsertion of displaced NG-T on 05/02/11. Throat appeared clear, without hyperemia or exudate. Managed successfuly with with Chloraseptic spray.  -improved 1/9 s/p NGT removal  10. AKI: Had jump in creatinine from 0.93 to 1.72, overnight on 05/02/12. Responded to iv fluids, and creatinine has normalized and remains wnl.  11.moderate oral and mild pharyngeal phase dysphagia: Per modified barium swallow 05/11/2012. Continue dysphagia 3 diet with nectar thick liquids thin water between meals after oral care. Speech therapy following.  12. Diarrhea :  The hospitalization after resolution of patient's small bowel obstruction patient was noted to have a diarrhea. Patient was also noted to be on Colace. Patient's Colace was discontinued. C. difficile PCR was obtained which was negative x2. Patient's antibiotics were discontinued and she was monitored. Patient was placed on Imodium as needed. Patient's diarrhea resolved. And patient  was also  slowly noted to have ischemia. Patient was maintained on florastor. Patient be discharged in stable and improved condition. 13. Hypernatremia  Resolved, #11 was likely contributing -changed to hypotonic ivf on 1/12, HCTZ was also discontinued. 14. Hypokalemia  -replaced  15.Lung infiltrates - PNA vs Pulm edema  On admission due to concerns of lung infiltrates of pneumonia versus pulmonary edema patient was empirically started on IV antibiotics of Zosyn. Due to her fluctuating white count antibiotic coverage was initially brought in to include IV vancomycin. Patient was seen in consultation by ID. Patient had received a total about 6-7 days of IV antibiotics. It was recommended to DC antibiotics. Patient was also given a dose of IV Lasix. Patient was monitored off antibiotics and improved clinically.  2-D echo with EF of 50-55%. No wall motion abnormalities. Left atrium was mildly dilated. Mildly to moderately fibrotic mitral valve annulus. Patient improved clinically it was felt no further antibiotic treatment was needed per ID. Patient be discharged in stable and improved condition.       Procedures: CXR.  Abdominal X-Ray.  X-Ray right foot.  CT Abdomen/pelvis.       Consultations: Almond Lint, surgeon.  Dr. Luciana Axe, infectious diseases   Discharge Exam: Filed Vitals:   05/16/12 0511 05/16/12 1327 05/16/12 2100 05/17/12 0554  BP: 162/56 138/46 153/60 158/81  Pulse: 60 68 71 65  Temp: 97.8 F (36.6 C) 98 F (36.7 C) 98.6 F (37 C) 97.8 F (36.6 C)  TempSrc: Oral Oral Oral Oral  Resp: 20 18 20 20   Height:      Weight: 66 kg (145 lb 8.1 oz)   66.9 kg (147 lb 7.8 oz)  SpO2: 98% 99% 99% 99%    General: NAD Cardiovascular: RRR Respiratory: CTAB  Discharge Instructions      Discharge Orders    Future Orders Please Complete By Expires   Diet general      Comments:   Dysphagia 3 diet with nectar thick liquids   Increase activity slowly      Discharge instructions        Comments:   Followup with M.D. at the skilled nursing facility.       Medication List     As of 05/17/2012 10:58 AM    STOP taking these medications         AMLODIPINE BESYLATE PO      DIOVAN PO      spironolactone-hydrochlorothiazide 25-25 MG per tablet   Commonly known as: ALDACTAZIDE      TAKE these medications         ALPRAZolam 0.5 MG tablet   Commonly known as: XANAX   Take 0.25 mg by mouth 2 (two) times daily as needed. anxiety      aspirin 81 MG tablet   Take 81 mg by mouth daily.      ATENOLOL PO   Take 50 mg by mouth 2 (two) times daily.      cetirizine 10 MG tablet   Commonly known as: ZYRTEC   Take 10 mg by mouth daily.      cyclobenzaprine 5 MG tablet   Commonly known as: FLEXERIL   Take 1 tablet (5 mg total) by mouth 3 (three) times daily as needed for muscle spasms.      food thickener Powd   Commonly known as: THICK IT   Neck to pick liquids with patient's dysphagia 3 diet.      GLUCOSAMINE COMPLEX PO   Take 1  tablet by mouth daily.      HYDROcodone-acetaminophen 5-325 MG per tablet   Commonly known as: NORCO/VICODIN   Take 1 tablet by mouth every 6 (six) hours as needed for pain. pain      loperamide 2 MG capsule   Commonly known as: IMODIUM   Take 1 capsule (2 mg total) by mouth as needed for diarrhea or loose stools.      pantoprazole 40 MG tablet   Commonly known as: PROTONIX   Take 1 tablet (40 mg total) by mouth 2 (two) times daily.      saccharomyces boulardii 250 MG capsule   Commonly known as: FLORASTOR   Take 1 capsule (250 mg total) by mouth 2 (two) times daily.      spironolactone 25 MG tablet   Commonly known as: ALDACTONE   Take 1 tablet (25 mg total) by mouth daily.      VITAMIN B 12 PO   Take by mouth.         Follow-up Information    Please follow up. (Followup with M.D. at the skilled nursing facility.)           The results of significant diagnostics from this hospitalization (including imaging,  microbiology, ancillary and laboratory) are listed below for reference.    Significant Diagnostic Studies: Dg Chest 2 View  05/01/2012  *RADIOLOGY REPORT*  Clinical Data: Emesis, question aspiration  CHEST - 2 VIEW  Comparison: Prior radiograph of 05/01/2012  Findings: Nasogastric tube extends into stomach. Enlargement of cardiac silhouette. Calcified left upper lobe nodular density again seen. No new infiltrate or pleural effusion. Atherosclerotic calcification of a mildly tortuous thoracic aorta. Bones demineralized.  IMPRESSION: No new infiltrates identified. Enlargement of cardiac silhouette. Old granulomatous disease.   Original Report Authenticated By: Ulyses Southward, M.D.    Ct Abdomen Pelvis W Contrast  05/01/2012  *RADIOLOGY REPORT*  Clinical Data: Nausea, vomiting, question small bowel obstruction, abnormal radiographs  CT ABDOMEN AND PELVIS WITH CONTRAST  Technique:  Multidetector CT imaging of the abdomen and pelvis was performed following the standard protocol during bolus administration of intravenous contrast. Sagittal and coronal MPR images reconstructed from axial data set.  Contrast: OMNIPAQUE IOHEXOL 300 MG/ML  SOLN Dilute oral contrast.  Comparison: Abdominal radiographs 05/01/2012  Findings: Minimal atelectasis right lung base. Moderate sized hiatal hernia. Small cyst upper pole right kidney. Liver, spleen, pancreas, kidneys, and adrenal glands otherwise unremarkable. Dilatation of stomach and proximal small bowel loops with decompressed distal small bowel loops compatible with small bowel obstruction. Dilatation of small bowel loops terminates in the upper mid pelvis anteriorly image 62. Small bowel stool sign is identified within the dilated distal small bowel loops. Question minimal bowel wall thickening at the point of obstruction. Stool is present throughout the colon and rectum.  No additional bowel wall thickening, free fluid, or free air. Unremarkable bladder, ureters, and  adnexae. Small fat containing lipoleiomyoma within uterus. Normal appendix. No mass, adenopathy or hernia. Bones appear demineralized. Scattered atherosclerotic calcifications. Degenerative disc disease changes lumbar spine with superior endplate compression fracture of T11, age indeterminate.  IMPRESSION: Small bowel obstruction with transition zone in the anterior central pelvis, associated with bowel wall thickening at the point of obstruction. This could represent an adhesion though subtle mass lesion is not excluded. Increased stool throughout colon. Moderate sized hiatal hernia.   Original Report Authenticated By: Ulyses Southward, M.D.    Dg Chest Port 1 View  05/12/2012  *RADIOLOGY REPORT*  Clinical Data:  Pulmonary infiltrates.  PORTABLE CHEST - 1 VIEW  Comparison: 01/13 and 05/01/2012  Findings: There is a faint patchy bilateral pulmonary infiltrates appears slightly more prominent particularly in the left upper and lower lung zones.  Overall heart size and vascularity are normal. Stable pleural calcification and soft tissue density in the left upper lung zone. There is a compression fracture of the superior aspect of T11, age indeterminate.  IMPRESSION: Faint patchy bilateral pulmonary infiltrates appears slightly progressed.   Original Report Authenticated By: Francene Boyers, M.D.    Dg Chest Port 1 View  05/10/2012  *RADIOLOGY REPORT*  Clinical Data: Cough.  PORTABLE CHEST - 1 VIEW  Comparison: 05/01/2012.  Findings: The cardiac silhouette, mediastinal and hilar contours are normal and stable.  There is tortuosity and calcification of the thoracic aorta.  Interval worsening lung aeration with possible asymmetric edema or bilateral infiltrates.  No pleural effusion. The bony thorax is intact  IMPRESSION: Interval worsening lung aeration possibly due to asymmetric edema or bilateral infiltrates.   Original Report Authenticated By: Rudie Meyer, M.D.    Dg Abd 2 Views  05/03/2012  *RADIOLOGY REPORT*   Clinical Data: Small bowel obstruction.  ABDOMEN - 2 VIEW  Comparison: 05/02/2012  Findings: Nasogastric tube positioning stable with the tip extending into the stomach.  Degree of dilatation of small bowel loops is relatively stable with more visible air filled small bowel loops present but no significant increase in maximal small bowel caliber.  There remains stool and air throughout the colon.  No evidence of free air.  IMPRESSION: Relatively stable small bowel dilatation.  No evidence of bowel perforation.   Original Report Authenticated By: Irish Lack, M.D.    Dg Abd 2 Views  05/02/2012  *RADIOLOGY REPORT*  Clinical Data: Abdominal pain, weakness  ABDOMEN - 2 VIEW  Comparison: CT 05/01/2012  Findings: Nasogastric tube is appropriately positioned.  No free air on decubitus imaging.  Slight interval decrease in caliber of multiple dilated small bowel loops over the mid abdomen, maximal diameter now 4.1 cm.  Mild-moderate stool burden throughout nondilated colon.  Leftward curvature of the lumbar spine noted with atheromatous calcification of the aorta.  Retained contrast within the bladder.  IMPRESSION: Interval decrease in caliber of dilated small bowel loops, suggesting improvement in small bowel obstruction.   Original Report Authenticated By: Christiana Pellant, M.D.    Dg Abd Acute W/chest  05/01/2012  *RADIOLOGY REPORT*  Clinical Data: Nausea, vomiting, abdominal pain  ACUTE ABDOMEN SERIES (ABDOMEN 2 VIEW & CHEST 1 VIEW)  Comparison: Chest radiograph 05/28/2006 Correlation:  CT chest 11/10/2005  Findings: Mild enlargement of cardiac silhouette. Atherosclerotic ossification aorta. Mediastinal contours and pulmonary vascularity otherwise normal. Emphysematous and bronchitic changes consistent with COPD. Partially calcified anterior left upper lobe nodule, little changed. No acute infiltrate, pleural effusion, or pneumothorax.  Bones appear demineralized. Dilated air-filled small bowel loops with scattered  air-fluid levels on left lateral decubitus view question small bowel obstruction. Scattered stool present throughout the colon to the rectum. No definite free intraperitoneal air or definite urinary tract calcification.  IMPRESSION: Dilated air-filled loops of small bowel with scattered air-fluid levels on left lateral decubitus view question small bowel obstruction. However, stool is present throughout colon to rectum. Question COPD with old calcified granuloma left upper lobe.   Original Report Authenticated By: Ulyses Southward, M.D.    Dg Abd Portable 1v  05/03/2012  *RADIOLOGY REPORT*  Clinical Data: Evaluate NG tube placement.  PORTABLE ABDOMEN - 1 VIEW  Comparison: Earlier  today at 0850 hours.  Findings: 1043 hours.  Nasogastric tube has been advanced. Terminates at the body of the stomach, with the side port just above the gastroesophageal junction.  Slight improvement in small bowel dilatation. No free intraperitoneal air.  Right hemidiaphragm elevation.  Mild cardiomegaly. T11 compression deformity is incidentally noted.  IMPRESSION: Advancement of nasogastric tube into the body of the stomach, with the side port just below the gastroesophageal junction.   Original Report Authenticated By: Jeronimo Greaves, M.D.    Dg Foot Complete Right  05/01/2012  *RADIOLOGY REPORT*  Clinical Data: Pain, bruising and swelling at dorsal midfoot post fall  RIGHT FOOT COMPLETE - 3+ VIEW  Comparison: None  Findings: Osseous demineralization. Minimal hallux valgus. Degenerative changes first MTP joint. Remaining joint spaces preserved. Small plantar calcaneal spur. Dorsal soft tissue swelling at mid foot. No definite acute fracture, dislocation, or bone destruction. Scattered atherosclerotic calcifications.  IMPRESSION: Osseous demineralization. Hallux valgus with mild degenerative changes first MTP joint. Dorsal soft tissue swelling midfoot without definite acute bony abnormalities.   Original Report Authenticated By: Ulyses Southward,  M.D.    Dg Swallowing Func-speech Pathology  05/12/2012  Patient Information       Patient Name Sex DOB SSN    Shealynn, Saulnier Female 12/06/1920 [id number]       Progress Notes signed by Chales Abrahams, CCC-SLP at 05/12/12 0102     Author: Chales Abrahams, CCC-SLP Service: (none) Author Type: Speech and Language Pathologist    Filed: 05/12/12 0950 Note Time: 05/12/11 1143           Modified Barium Swallow   Name: DORISANN SCHWANKE MRN: [id number] Date of Birth: 1920-06-29   Today's Date: 05/11/2012 Time: 1025-1049 SLP Time Calculation (min): 24 min   Past Medical History:   Past Medical History   Diagnosis  Date   .  Hypertension     .  Irregular heart beat        Past Surgical History:   Past Surgical History   Procedure  Date   .  Cataract extraction        HPI:   SHANTAE VANTOL is a 79 y.o. female  pt is s/p NG for decompression/SBO.  Pt with c/o N/V 2 days prior.  Per RN today, pt has been incontinent of bowel and bladder - is having bowel movevement.   Pt has hx of chronic constipations.  No fever or chills.  No diarrhea. She have had some URI symptoms all last week.  Swallow evaluation was ordered and BSE completed yesterday with concerns for pt aspirating.  Diet was modified to dys3/honey and MBS ordered.  CXR 1/13 showed worsening lung aeration due to asymmetric edema or bilateral infiltrates.  Pt for repeat CXR today.        Assessment / Plan / Recommendation Clinical Impression    Dysphagia Diagnosis: Moderate oral phase dysphagia;Mild pharyngeal phase dysphagia  Clinical impression: Pt presents with moderate oral and mild pharyngeal phase dysphagia with single episode of mild aspiration  with thin liquid (with delayed reflexive cough.)  Trace aspiration occured due to oral residue that spilled into pharynx and accumulated at pyriform sinus without pt awareness- therefore when pt spilled barium into larynx/trachea with CUED swallow.   Further boluses (x9) were not aspirated or deeply penetrated.   Unfortunately pt coughed frequently during testing without barium visualized in larynx/trachea- making clinical follow up difficult.  Delayed oral transit, decr oral coordination resulting in oral residuals with  all boluses.  Consuming intake appears effortful for this pt, evidenced by pt producing a heavy sigh with cracker retained in her mouth.   Due to aspiration episode without clearance, rec consider advancing diet to soft/nectar while acutely ill.  Also recommend allow pt thin water between meals (after oral care) for hydration.  Anticipate pt's swallow to improve consistent with medical  progression.    SLP to follow for education/tolerance.  Thanks for the order.       Treatment Recommendation    Therapy as outlined in treatment plan below       Diet Recommendation  Dysphagia 3 (Mechanical Soft);Nectar-thick liquid (thin water between meals after oral care)    Liquid Administration via: Cup;Straw Medication Administration: Whole meds with puree Supervision: Full supervision/cueing for compensatory strategies;Patient able to self feed Compensations: Slow rate;Small sips/bites (intermittent dry swallow- follow solids with liquids)      Other  Recommendations  Oral Care Recommendations: Oral care BID    Follow Up Recommendations    24 hour supervision/assistance     Frequency and Duration  min 2x/week    2 weeks    Pertinent Vitals/Pain  Afebrile, decreased- productive cough to clear secretions        SLP Swallow Goals Patient will consume recommended diet without observed clinical signs of aspiration with:  (discontinued:  pt with overt coughing without po) Patient will utilize recommended strategies during swallow to increase swallowing safety with: Moderate cueing      General  Date of Onset: 05/11/12  HPI: GETHSEMANE FISCHLER is a 79 y.o. female  pt is s/p NG for decompression/SBO.  Pt with c/o N/V 2 days prior.  Per RN today, pt has been incontinent of bowel and bladder - is having bowel movevement.   Pt has  hx of chronic constipations.  No fever or chills.  No diarrhea. She have had some URI symptoms all last week.  Swallow evaluation was ordered and BSE completed yesterday with concerns for pt aspirating.  Diet was modified to dys3/honey and MBS ordered.  CXR 1/13 showed worsening lung aeration due to asymmetric edema or bilateral infiltrates.  Pt for repeat CXR today.   Type of Study: Modified Barium Swallowing Study Reason for Referral: Objectively evaluate swallowing function Diet Prior to this Study: Dysphagia 3 (soft);Honey-thick liquids Temperature Spikes Noted: No Respiratory Status: Room air History of Recent Intubation: No Behavior/Cognition: Alert;Cooperative;Requires cueing;Distractible Oral Cavity - Dentition: Adequate natural dentition Self-Feeding Abilities: Needs assist;Needs set up Patient Positioning: Upright in chair Baseline Vocal Quality: Clear Volitional Cough: Congested (productive) Volitional Swallow: Able to elicit Anatomy: Within functional limits Pharyngeal Secretions: Not observed secondary MBS      Reason for Referral  Objectively evaluate swallowing function    Oral Phase  Oral Preparation/Oral Phase  Oral Phase: Impaired Oral - Nectar Oral - Nectar Teaspoon: Reduced posterior propulsion;Delayed oral transit;Piecemeal swallowing;Weak lingual manipulation Oral - Nectar Cup: Reduced posterior propulsion;Piecemeal swallowing;Delayed oral transit;Weak lingual manipulation Oral - Thin Oral - Thin Teaspoon: Reduced posterior propulsion;Delayed oral transit;Lingual/palatal residue;Piecemeal swallowing;Weak lingual manipulation Oral - Thin Cup: Reduced posterior propulsion;Delayed oral transit;Lingual/palatal residue;Piecemeal swallowing;Weak lingual manipulation Oral - Thin Straw: Reduced posterior propulsion;Delayed oral transit;Piecemeal swallowing;Lingual/palatal residue;Weak lingual manipulation Oral - Solids Oral - Puree: Reduced posterior propulsion;Lingual/palatal residue;Piecemeal  swallowing;Delayed oral transit;Weak lingual manipulation;Pocketing in anterior sulcus Oral - Regular: Reduced posterior propulsion;Lingual/palatal residue;Piecemeal swallowing;Delayed oral transit;Impaired mastication;Weak lingual manipulation;Pocketing in anterior sulcus Oral Phase - Comment Oral Phase - Comment: pt with pocketing in anterior region with  solids without adequate awareness, dry and liquid swallows aid oral clearance, piecemeal deglutiton noted as oral residuals spill into pharynx after the swallow    Pharyngeal Phase  Pharyngeal Phase  Pharyngeal Phase: Impaired Pharyngeal - Nectar Pharyngeal - Nectar Teaspoon: Premature spillage to valleculae Pharyngeal - Nectar Cup: Premature spillage to valleculae Pharyngeal - Thin Pharyngeal - Thin Teaspoon: Premature spillage to valleculae;Reduced anterior laryngeal mobility;Reduced laryngeal elevation;Reduced airway/laryngeal closure Pharyngeal - Thin Cup: Premature spillage to pyriform sinuses;Trace aspiration;Penetration/Aspiration after swallow;Pharyngeal residue - pyriform sinuses;Penetration/Aspiration during swallow;Reduced anterior laryngeal mobility;Reduced laryngeal elevation;Reduced airway/laryngeal closure (mild asp-trace penetration during the swallow) Penetration/Aspiration details (thin cup): Material enters airway, passes BELOW cords and not ejected out despite cough attempt by patient Pharyngeal - Thin Straw: Premature spillage to valleculae Pharyngeal - Solids Pharyngeal - Puree: Delayed swallow initiation;Premature spillage to valleculae Pharyngeal - Regular: Delayed swallow initiation;Premature spillage to valleculae Pharyngeal Phase - Comment Pharyngeal Comment: mild delayed audible aspiration x1 with delayed cough due to oral residue of thin barium accumulating at pyriform sinus without swallow trigger, cued delayed swallow resulted in mild aspiration   Cervical Esophageal Phase        GO       Cervical Esophageal Phase  Cervical  Esophageal Phase: Impaired Cervical Esophageal Phase - Comment Cervical Esophageal Comment: appearance of prominent cricopharyngeus observed that did not impair barium flow, appeared of delayed distal clearance of liquid barium without pt awareness- that may be consistent with mild motility issues, at completion of MBS esophagus appeared clear, radiologist not present to confirm             Donavan Burnet, MS [facility] [phone]              Microbiology: Recent Results (from the past 240 hour(s))  CLOSTRIDIUM DIFFICILE BY PCR     Status: Normal   Collection Time   05/13/12  1:30 AM      Component Value Range Status Comment   C difficile by pcr NEGATIVE  NEGATIVE Final      Labs: Basic Metabolic Panel:  Lab 05/17/12 2130 05/16/12 0605 05/15/12 0550 05/14/12 0445 05/13/12 0440  NA 136 138 139 140 141  K 4.3 4.0 3.9 3.7 3.9  CL 106 109 108 110 110  CO2 20 19 20 19 24   GLUCOSE 103* 108* 124* 165* 108*  BUN 19 24* 28* 20 19  CREATININE 0.65 0.64 0.73 0.64 0.80  CALCIUM 8.2* 8.3* 8.3* 7.9* 7.8*  MG -- -- -- -- --  PHOS -- -- -- -- --   Liver Function Tests: No results found for this basename: AST:5,ALT:5,ALKPHOS:5,BILITOT:5,PROT:5,ALBUMIN:5 in the last 168 hours No results found for this basename: LIPASE:5,AMYLASE:5 in the last 168 hours No results found for this basename: AMMONIA:5 in the last 168 hours CBC:  Lab 05/15/12 0550 05/14/12 0445 05/13/12 0440 05/12/12 0450 05/11/12 0505  WBC 19.9* 21.0* 13.1* 16.5* 16.8*  NEUTROABS 12.1* -- 7.9* -- --  HGB 9.0* 8.9* 8.8* 10.0* 8.8*  HCT 28.7* 28.1* 27.7* 31.7* 27.5*  MCV 85.7 85.2 86.0 85.0 84.1  PLT 386 355 335 323 268   Cardiac Enzymes: No results found for this basename: CKTOTAL:5,CKMB:5,CKMBINDEX:5,TROPONINI:5 in the last 168 hours BNP: BNP (last 3 results)  Basename 05/11/12 0505  PROBNP 5916.0*   CBG:  Lab 05/17/12 0828 05/16/12 1742 05/16/12 0742 05/15/12 1649 05/15/12 0748  GLUCAP 112* 120* 80 105* 98        Signed:  Caellum Mancil  [facility] 05/17/2012, 10:58 AM

## 2012-05-17 NOTE — Progress Notes (Signed)
Patient has a bed at [facility] today. CSW spoke with patient's son at bedside, [name] - anticipating discharge today. Patient will be transported via [facility].   Clinical Social Work Department CLINICAL SOCIAL WORK PLACEMENT NOTE 05/17/2012  Patient:  [name], [name]  Account Number:  [id number] Admit date:  04/30/2012  Clinical Social Worker:  Orpah Greek  Date/time:  05/13/2012 03:58 PM  Clinical Social Work is seeking post-discharge placement for this patient at the following level of care:   SKILLED NURSING   (*CSW will update this form in Epic as items are completed)   05/06/2012  Patient/family provided with [facility] Department of Clinical Social Work's list of facilities offering this level of care within the geographic area requested by the patient (or if unable, by the patient's family).  05/06/2012  Patient/family informed of their freedom to choose among providers that offer the needed level of care, that participate in Medicare, Medicaid or managed care program needed by the patient, have an available bed and are willing to accept the patient.  05/06/2012  Patient/family informed of [facility]' ownership interest in [facility], as well as of the fact that they are under no obligation to receive care at this facility.  PASARR submitted to EDS on 05/17/2012 PASARR number received from EDS on 05/17/2012  FL2 transmitted to all facilities in geographic area requested by pt/family on  05/06/2012 FL2 transmitted to all facilities within larger geographic area on   Patient informed that his/her managed care company has contracts with or will negotiate with  certain facilities, including the following:     Patient/family informed of bed offers received:  05/13/2012 Patient chooses bed at [facility] Physician recommends and patient chooses bed at    Patient to be transferred to [facility] on   Patient to  be transferred to facility by [facility]  The following physician request were entered in Epic:   Additional Comments:  Unice Bailey, LCSW [facility] Clinical Social Worker cell #: [phone]

## 2012-05-17 NOTE — Progress Notes (Signed)
Physical Therapy Treatment Patient Details Name: [name] MRN: [id number] DOB: [birth year] Today's Date: 05/17/2012 Time: 4098-1191 PT Time Calculation (min): 25 min  PT Assessment / Plan / Recommendation Comments on Treatment Session  Pt required MAX encouragenment to increase self assistance and decrease fear/anxiety as pt repeats "I can't".  Assisted pt OOB to [facility] then amb klimited distance due to weakness. Pt plans to D/C to SNF.    Follow Up Recommendations  SNF     Does the patient have the potential to tolerate intense rehabilitation     Barriers to Discharge        Equipment Recommendations  None recommended by PT    Recommendations for Other Services    Frequency Min 3X/week   Plan Discharge plan remains appropriate    Precautions / Restrictions Precautions Precautions: Fall Precaution Comments: anxiety/fear Restrictions Weight Bearing Restrictions: No Other Position/Activity Restrictions: WBAT  Currently on 2 lts O2  Pertinent Vitals/Pain No c/o pain    Mobility  Bed Mobility Bed Mobility: Supine to Sit Sit to Supine: 3: Mod assist Details for Bed Mobility Assistance: increased time and 50% VC's on proper hand placement Transfers Transfers: Sit to Stand;Stand to Sit Sit to Stand: 4: Min assist;3: Mod assist;From bed;From toilet Stand to Sit: 4: Min assist;3: Mod assist;To chair/3-in-1;To toilet Details for Transfer Assistance: Assisted pt from bed to [facility] to void then to standing to amb. Pt required + 2 assist for safety and hygiene. Ambulation/Gait Ambulation Distance (Feet): 6 Feet Assistive device: Rolling walker Ambulation/Gait Assistance Details: Pt required MAX encouragement to increase amb distance due to fear/anxiety.  Very unsteady gait. Gait Pattern: Step-to pattern;Step-through pattern;Trunk flexed Gait velocity: decreased     PT Goals                                      progressing    Visit Information  Last PT Received On:  05/17/12 Assistance Needed: +2    Subjective Data  Subjective: I can't walk   Cognition       Balance     End of Session PT - End of Session Equipment Utilized During Treatment: Gait belt Activity Tolerance: Patient limited by fatigue Patient left: in chair;with call bell/phone within reach   Felecia Shelling  PTA [facility]  Acute  Rehab Pager     [phone]

## 2012-05-17 NOTE — H&P (Signed)
Report called to Carita Pian at [facility].

## 2012-05-17 NOTE — Progress Notes (Signed)
Patient is set to discharge to [facility] (ph#: [phone]) today once facility calls with the ok. Son to complete admission paperwork @ 1:00 this afternoon. Patient will be transported via [facility].   Clinical Social Work Department CLINICAL SOCIAL WORK PLACEMENT NOTE 05/17/2012  Patient:  [name], [name]  Account Number:  [id number] Admit date:  04/30/2012  Clinical Social Worker:  Orpah Greek  Date/time:  05/13/2012 03:58 PM  Clinical Social Work is seeking post-discharge placement for this patient at the following level of care:   SKILLED NURSING   (*CSW will update this form in Epic as items are completed)   05/06/2012  Patient/family provided with [facility] Department of Clinical Social Work's list of facilities offering this level of care within the geographic area requested by the patient (or if unable, by the patient's family).  05/06/2012  Patient/family informed of their freedom to choose among providers that offer the needed level of care, that participate in Medicare, Medicaid or managed care program needed by the patient, have an available bed and are willing to accept the patient.  05/06/2012  Patient/family informed of [facility]' ownership interest in [facility], as well as of the fact that they are under no obligation to receive care at this facility.  PASARR submitted to EDS on 05/17/2012 PASARR number received from EDS on 05/17/2012  FL2 transmitted to all facilities in geographic area requested by pt/family on  05/06/2012 FL2 transmitted to all facilities within larger geographic area on   Patient informed that his/her managed care company has contracts with or will negotiate with  certain facilities, including the following:     Patient/family informed of bed offers received:  05/13/2012 Patient chooses bed at [facility] Physician recommends and patient chooses bed at    Patient to be transferred to [facility] on  05/17/2012 Patient to be transferred to facility by [facility]  The following physician request were entered in Epic:   Additional Comments:  Unice Bailey, LCSW [facility] Clinical Social Worker cell #: [phone]

## 2012-07-28 ENCOUNTER — Emergency Department (HOSPITAL_COMMUNITY)
Admission: EM | Admit: 2012-07-28 | Discharge: 2012-07-28 | Disposition: A | Discharge status: Home / Self Care | Payer: Medicare Other | Attending: Emergency Medicine | Admitting: Emergency Medicine

## 2012-07-28 ENCOUNTER — Emergency Department (HOSPITAL_COMMUNITY): Payer: Medicare Other

## 2012-07-28 ENCOUNTER — Encounter (HOSPITAL_COMMUNITY): Payer: Self-pay | Admitting: *Deleted

## 2012-07-28 DIAGNOSIS — M25559 Pain in unspecified hip: Secondary | ICD-10-CM | POA: Insufficient documentation

## 2012-07-28 DIAGNOSIS — M25552 Pain in left hip: Secondary | ICD-10-CM

## 2012-07-28 DIAGNOSIS — Z79899 Other long term (current) drug therapy: Secondary | ICD-10-CM | POA: Insufficient documentation

## 2012-07-28 DIAGNOSIS — Z8679 Personal history of other diseases of the circulatory system: Secondary | ICD-10-CM | POA: Insufficient documentation

## 2012-07-28 DIAGNOSIS — I10 Essential (primary) hypertension: Secondary | ICD-10-CM | POA: Insufficient documentation

## 2012-07-28 DIAGNOSIS — Z8719 Personal history of other diseases of the digestive system: Secondary | ICD-10-CM | POA: Insufficient documentation

## 2012-07-28 HISTORY — DX: Gastro-esophageal reflux disease without esophagitis: K21.9

## 2012-07-28 NOTE — ED Notes (Signed)
[facility] called will transport  pt home

## 2012-07-28 NOTE — ED Notes (Signed)
Per ems pt is from home. Pt denies fall. Reports at 0400 left hip pain, reports thinks it is more of a muscle pain, but granddaughter wanted pt to be transported for evaluation.

## 2012-07-28 NOTE — ED Notes (Signed)
Pt ambulated steady on her  feet with walker ambulated only 25 feet deiws dizziness

## 2012-07-28 NOTE — ED Provider Notes (Signed)
[name] is a 79 y.o. female who reported to family members, that she had left hip pain at 4:00 in the morning, today. Later, she took a bath with assistance and again had pain. There has been no clear trauma. She is getting at home Physical therapy. She is debilitated from a recent hospitalization and stay at a rehabilitation facility; and now has been home for several weeks. She is a poor historian. Family members in the room give history.  Elderly patient alert and cooperative. She is able to sit with minimal assistance. There is no lumbar tenderness. There is mild tenderness to palpation of the left hip. There is no pain on left hip passive motion. There is no pelvic tenderness or pelvic instability.  Ambulation trial    Medical screening examination/treatment/procedure(s) were conducted as a shared visit with non-physician practitioner(s) and myself.  I personally evaluated the patient during the encounter:  Flint Melter, MD 07/29/12 [phone]

## 2012-07-28 NOTE — ED Notes (Signed)
[id number]<AV> Expected date:<BR> Expected time:<BR> Means of arrival:<BR> Comments:<BR> ems hip pain

## 2012-07-28 NOTE — ED Provider Notes (Signed)
History     CSN: [id number]  Arrival date & time 07/28/12  1354   First MD Initiated Contact with Patient 07/28/12 1520      Chief Complaint  Patient presents with  . Hip Pain    (Consider location/radiation/quality/duration/timing/severity/associated sxs/prior treatment) HPI Comments: This is an 79 year old female, past medical history remarkable for hypertension, who presents emergency department with chief complaint of left hip pain. Patient states that she's been having left hip pain for several weeks. She states that her hip pain has been worsening. She denies having any fall or traumatic injury.  She has been able to ambulate, although it is painful.  She states that her pain is moderate, and is worsened with movement.  The history is provided by the patient. No language interpreter was used.    Past Medical History  Diagnosis Date  . Hypertension   . Irregular heart beat   . GERD (gastroesophageal reflux disease)     Past Surgical History  Procedure Laterality Date  . Cataract extraction      Family History  Problem Relation Age of Onset  . Heart disease Mother   . Stroke Father     History  Substance Use Topics  . Smoking status: Never Smoker   . Smokeless tobacco: Not on file  . Alcohol Use: No    OB History   Grav Para Term Preterm Abortions TAB SAB Ect Mult Living                  Review of Systems  All other systems reviewed and are negative.    Allergies  Neosporin and Sulfa antibiotics  Home Medications   Current Outpatient Rx  Name  Route  Sig  Dispense  Refill  . ATENOLOL PO   Oral   Take 50 mg by mouth 2 (two) times daily.          . cetirizine (ZYRTEC) 10 MG tablet   Oral   Take 10 mg by mouth daily.         Marland Kitchen HYDROcodone-acetaminophen (NORCO/VICODIN) 5-325 MG per tablet   Oral   Take 1 tablet by mouth every 6 (six) hours as needed for pain. pain   15 tablet   0   . loperamide (IMODIUM) 2 MG capsule   Oral   Take 1  capsule (2 mg total) by mouth as needed for diarrhea or loose stools.   20 capsule   0   . solifenacin (VESICARE) 5 MG tablet   Oral   Take 5 mg by mouth every morning.           BP 150/53  Pulse 63  Temp(Src) 98.4 F (36.9 C) (Oral)  Resp 20  SpO2 98%  Physical Exam  Nursing note and vitals reviewed. Constitutional: She is oriented to person, place, and time. She appears well-developed and well-nourished.  HENT:  Head: Normocephalic and atraumatic.  Eyes: Conjunctivae and EOM are normal. Pupils are equal, round, and reactive to light.  Neck: Normal range of motion. Neck supple.  Cardiovascular: Normal rate and regular rhythm.  Exam reveals no gallop and no friction rub.   No murmur heard. Pulmonary/Chest: Effort normal and breath sounds normal. No respiratory distress. She has no wheezes. She has no rales. She exhibits no tenderness.  Abdominal: Soft. Bowel sounds are normal. She exhibits no distension and no mass. There is no tenderness. There is no rebound and no guarding.  Musculoskeletal: Normal range of motion. She exhibits no edema  and no tenderness.  Left hip full ROM, with some tenderness to palpation, no deformities or abnormalities,   Neurological: She is alert and oriented to person, place, and time.  Skin: Skin is warm and dry.  Psychiatric: She has a normal mood and affect. Her behavior is normal. Judgment and thought content normal.    ED Course  Procedures (including critical care time)  Labs Reviewed - No data to display Dg Hip Complete Left  07/28/2012  *RADIOLOGY REPORT*  Clinical Data: Fall.  Left posterior hip pain.  LEFT HIP - COMPLETE 2+ VIEW  Comparison: None.  Findings: The left hip is located.  No acute bone or soft tissue abnormality is present.  IMPRESSION: Negative left hip radiographs.   Original Report Authenticated By: Marin Roberts, M.D.      1. Hip pain, left       MDM  79 year old female with hip pain.  No MOI, or traumatic  event/fall.  Patient states that she has been having pain for quite some time.  She recently finished some home PT, which may have over-worked her.  Her plain films are negative.  The patient is able to ambulate with a walker, which is her baseline.  There is no redness, swelling, or joint deformity of the hip. Patient states that it feels like a pulled muscle.  I think this is a likely cause, as there is no other apparent injury.  4:29 PM Patient seen by and discussed with Dr. Effie Shy, who agrees with the plan.      Roxy Horseman, PA-C 07/28/12 [phone]

## 2012-09-21 ENCOUNTER — Emergency Department (HOSPITAL_COMMUNITY): Payer: Medicare Other

## 2012-09-21 ENCOUNTER — Emergency Department (HOSPITAL_COMMUNITY)
Admission: EM | Admit: 2012-09-21 | Discharge: 2012-09-21 | Disposition: A | Discharge status: Home / Self Care | Payer: Medicare Other | Attending: Emergency Medicine | Admitting: Emergency Medicine

## 2012-09-21 ENCOUNTER — Encounter (HOSPITAL_COMMUNITY): Payer: Self-pay

## 2012-09-21 DIAGNOSIS — J40 Bronchitis, not specified as acute or chronic: Secondary | ICD-10-CM

## 2012-09-21 DIAGNOSIS — R05 Cough: Secondary | ICD-10-CM | POA: Insufficient documentation

## 2012-09-21 DIAGNOSIS — Z8679 Personal history of other diseases of the circulatory system: Secondary | ICD-10-CM | POA: Insufficient documentation

## 2012-09-21 DIAGNOSIS — F29 Unspecified psychosis not due to a substance or known physiological condition: Secondary | ICD-10-CM | POA: Insufficient documentation

## 2012-09-21 DIAGNOSIS — R233 Spontaneous ecchymoses: Secondary | ICD-10-CM | POA: Insufficient documentation

## 2012-09-21 DIAGNOSIS — T148XXA Other injury of unspecified body region, initial encounter: Secondary | ICD-10-CM

## 2012-09-21 DIAGNOSIS — R059 Cough, unspecified: Secondary | ICD-10-CM | POA: Insufficient documentation

## 2012-09-21 DIAGNOSIS — J209 Acute bronchitis, unspecified: Secondary | ICD-10-CM | POA: Insufficient documentation

## 2012-09-21 DIAGNOSIS — I10 Essential (primary) hypertension: Secondary | ICD-10-CM | POA: Insufficient documentation

## 2012-09-21 DIAGNOSIS — Z8719 Personal history of other diseases of the digestive system: Secondary | ICD-10-CM | POA: Insufficient documentation

## 2012-09-21 DIAGNOSIS — Z79899 Other long term (current) drug therapy: Secondary | ICD-10-CM | POA: Insufficient documentation

## 2012-09-21 DIAGNOSIS — E876 Hypokalemia: Secondary | ICD-10-CM | POA: Insufficient documentation

## 2012-09-21 LAB — URINALYSIS, ROUTINE W REFLEX MICROSCOPIC
Bilirubin Urine: NEGATIVE
Hgb urine dipstick: NEGATIVE
Ketones, ur: NEGATIVE mg/dL
Protein, ur: 30 mg/dL — AB
Urobilinogen, UA: 1 mg/dL (ref 0.0–1.0)

## 2012-09-21 LAB — CBC WITH DIFFERENTIAL/PLATELET
Basophils Relative: 0 % (ref 0–1)
Eosinophils Absolute: 0 10*3/uL (ref 0.0–0.7)
HCT: 30.5 % — ABNORMAL LOW (ref 36.0–46.0)
Hemoglobin: 9.6 g/dL — ABNORMAL LOW (ref 12.0–15.0)
MCH: 27.3 pg (ref 26.0–34.0)
MCHC: 31.5 g/dL (ref 30.0–36.0)
Monocytes Absolute: 2.5 10*3/uL — ABNORMAL HIGH (ref 0.1–1.0)
Monocytes Relative: 26 % — ABNORMAL HIGH (ref 3–12)
Neutro Abs: 4.8 10*3/uL (ref 1.7–7.7)

## 2012-09-21 LAB — COMPREHENSIVE METABOLIC PANEL
Albumin: 3.2 g/dL — ABNORMAL LOW (ref 3.5–5.2)
BUN: 11 mg/dL (ref 6–23)
Chloride: 104 mEq/L (ref 96–112)
Creatinine, Ser: 0.66 mg/dL (ref 0.50–1.10)
GFR calc Af Amer: 87 mL/min — ABNORMAL LOW (ref >90)
GFR calc non Af Amer: 75 mL/min — ABNORMAL LOW (ref >90)
Total Bilirubin: 0.3 mg/dL (ref 0.3–1.2)

## 2012-09-21 LAB — PROTIME-INR
INR: 1.11 (ref 0.00–1.49)
Prothrombin Time: 14.2 seconds (ref 11.6–15.2)

## 2012-09-21 MED ORDER — POTASSIUM CHLORIDE CRYS ER 20 MEQ PO TBCR
40.0000 meq | EXTENDED_RELEASE_TABLET | Freq: Once | ORAL | Status: AC
  Administered 2012-09-21: 40 meq via ORAL
  Filled 2012-09-21: qty 2

## 2012-09-21 MED ORDER — AZITHROMYCIN 250 MG PO TABS: 250.0000 mg | ORAL_TABLET | Freq: Every day | ORAL | Status: DC

## 2012-09-21 MED ORDER — AZITHROMYCIN 250 MG PO TABS
500.0000 mg | ORAL_TABLET | Freq: Once | ORAL | Status: AC
  Administered 2012-09-21: 500 mg via ORAL
  Filled 2012-09-21: qty 2

## 2012-09-21 MED ORDER — BENZONATATE 100 MG PO CAPS: 100.0000 mg | ORAL_CAPSULE | Freq: Three times a day (TID) | ORAL | Status: DC

## 2012-09-21 MED ORDER — SODIUM CHLORIDE 0.9 % IV SOLN
INTRAVENOUS | Status: DC
  Administered 2012-09-21: 13:00:00 via INTRAVENOUS

## 2012-09-21 NOTE — ED Provider Notes (Signed)
History     CSN: [id number]  Arrival date & time 09/21/12  0950   First MD Initiated Contact with Patient 09/21/12 1136      Chief Complaint  Patient presents with  . Cough  . Bleeding/Bruising    (Consider location/radiation/quality/duration/timing/severity/associated sxs/prior treatment) Patient is a 79 y.o. female presenting with cough. The history is provided by the patient and a relative.  Cough Associated symptoms: no chest pain, no chills, no fever, no headaches, no rash and no shortness of breath   pt c/o non productive cough/congestion for the past week. Denies sore throat, nasal congestion or other uri c/o. Denies fever or chills. Today family member noted small areas bruising bil forearms. No trauma or area. No other rash and/or abnormal bruising or bleeding. No anticoagulant use. Pt denies headache or neck pain. No cp or sob. No abd pain. No vomiting or diarrhea. No gu c/o. No recent change in meds or new meds, except mucinex for cough.      Past Medical History  Diagnosis Date  . Hypertension   . Irregular heart beat   . GERD (gastroesophageal reflux disease)     Past Surgical History  Procedure Laterality Date  . Cataract extraction    . Dilatation & currettage/hysteroscopy with resectocope      Family History  Problem Relation Age of Onset  . Heart disease Mother   . Stroke Father     History  Substance Use Topics  . Smoking status: Never Smoker   . Smokeless tobacco: Not on file  . Alcohol Use: No    OB History   Grav Para Term Preterm Abortions TAB SAB Ect Mult Living                  Review of Systems  Constitutional: Negative for fever and chills.  HENT: Negative for nosebleeds, neck pain and neck stiffness.   Eyes: Negative for redness.  Respiratory: Positive for cough. Negative for shortness of breath.   Cardiovascular: Negative for chest pain.  Gastrointestinal: Negative for vomiting and abdominal pain.  Genitourinary: Negative for  hematuria and flank pain.  Musculoskeletal: Negative for back pain.  Skin: Negative for rash.  Neurological: Negative for headaches.  Hematological: Does not bruise/bleed easily.    Allergies  Neosporin and Sulfa antibiotics  Home Medications   Current Outpatient Rx  Name  Route  Sig  Dispense  Refill  . acetaminophen (TYLENOL) 500 MG tablet   Oral   Take 500 mg by mouth every 6 (six) hours as needed for pain (pain).         . ATENOLOL PO   Oral   Take 50 mg by mouth daily.          Marland Kitchen dextromethorphan-guaiFENesin (MUCINEX DM) 30-600 MG per 12 hr tablet   Oral   Take 1 tablet by mouth every 12 (twelve) hours.         . folic acid (FOLVITE) 0.5 MG tablet   Oral   Take 1 mg by mouth daily.         . potassium chloride SA (K-DUR,KLOR-CON) 20 MEQ tablet   Oral   Take 20 mEq by mouth daily.         . solifenacin (VESICARE) 5 MG tablet   Oral   Take 5 mg by mouth every morning.           BP 157/56  Pulse 69  Temp(Src) 97.5 F (36.4 C) (Oral)  Resp 18  Ht  5\' 4"  (1.626 m)  Wt 130 lb (58.968 kg)  BMI 22.3 kg/m2  SpO2 99%  Physical Exam  Nursing note and vitals reviewed. Constitutional: She appears well-developed and well-nourished. No distress.  HENT:  Head: Atraumatic.  Nose: Nose normal.  Mouth/Throat: Oropharynx is clear and moist.  No mm lesions or petechia  Eyes: Conjunctivae are normal. Pupils are equal, round, and reactive to light. No scleral icterus.  Neck: Normal range of motion. Neck supple. No tracheal deviation present.  No stiffness or rigidity  Cardiovascular: Normal rate, regular rhythm, normal heart sounds and intact distal pulses.   Pulmonary/Chest: Effort normal. No respiratory distress.  Occasional non prod cough, mild upper resp congestion.   Abdominal: Soft. Normal appearance and bowel sounds are normal. She exhibits no distension. There is no tenderness.  Genitourinary:  No cva tenderness  Musculoskeletal: She exhibits no  edema and no tenderness.  Neurological: She is alert. No cranial nerve deficit.  Alert, content, mild confusion at times-  Family indicates mental status c/w baseline. Motor intact bil.   Skin: Skin is warm and dry. No rash noted.  Psychiatric: She has a normal mood and affect.    ED Course  Procedures (including critical care time)  Results for orders placed during the hospital encounter of 09/21/12  CBC WITH DIFFERENTIAL      Result Value Range   WBC 9.6  4.0 - 10.5 K/uL   RBC 3.52 (*) 3.87 - 5.11 MIL/uL   Hemoglobin 9.6 (*) 12.0 - 15.0 g/dL   HCT 78.2 (*) 95.6 - 21.3 %   MCV 86.6  78.0 - 100.0 fL   MCH 27.3  26.0 - 34.0 pg   MCHC 31.5  30.0 - 36.0 g/dL   RDW 08.6 (*) 57.8 - 46.9 %   Platelets 272  150 - 400 K/uL   Neutrophils Relative % 50  43 - 77 %   Neutro Abs 4.8  1.7 - 7.7 K/uL   Lymphocytes Relative 23  12 - 46 %   Lymphs Abs 2.2  0.7 - 4.0 K/uL   Monocytes Relative 26 (*) 3 - 12 %   Monocytes Absolute 2.5 (*) 0.1 - 1.0 K/uL   Eosinophils Relative 0  0 - 5 %   Eosinophils Absolute 0.0  0.0 - 0.7 K/uL   Basophils Relative 0  0 - 1 %   Basophils Absolute 0.0  0.0 - 0.1 K/uL  COMPREHENSIVE METABOLIC PANEL      Result Value Range   Sodium 138  135 - 145 mEq/L   Potassium 3.0 (*) 3.5 - 5.1 mEq/L   Chloride 104  96 - 112 mEq/L   CO2 23  19 - 32 mEq/L   Glucose, Bld 104 (*) 70 - 99 mg/dL   BUN 11  6 - 23 mg/dL   Creatinine, Ser 6.29  0.50 - 1.10 mg/dL   Calcium 9.1  8.4 - 52.8 mg/dL   Total Protein 8.2  6.0 - 8.3 g/dL   Albumin 3.2 (*) 3.5 - 5.2 g/dL   AST 17  0 - 37 U/L   ALT 7  0 - 35 U/L   Alkaline Phosphatase 67  39 - 117 U/L   Total Bilirubin 0.3  0.3 - 1.2 mg/dL   GFR calc non Af Amer 75 (*) >90 mL/min   GFR calc Af Amer 87 (*) >90 mL/min  PROTIME-INR      Result Value Range   Prothrombin Time 14.2  11.6 - 15.2 seconds  INR 1.11  0.00 - 1.49  APTT      Result Value Range   aPTT 34  24 - 37 seconds  URINALYSIS, ROUTINE W REFLEX MICROSCOPIC      Result  Value Range   Color, Urine YELLOW  YELLOW   APPearance CLEAR  CLEAR   Specific Gravity, Urine 1.017  1.005 - 1.030   pH 6.5  5.0 - 8.0   Glucose, UA NEGATIVE  NEGATIVE mg/dL   Hgb urine dipstick NEGATIVE  NEGATIVE   Bilirubin Urine NEGATIVE  NEGATIVE   Ketones, ur NEGATIVE  NEGATIVE mg/dL   Protein, ur 30 (*) NEGATIVE mg/dL   Urobilinogen, UA 1.0  0.0 - 1.0 mg/dL   Nitrite NEGATIVE  NEGATIVE   Leukocytes, UA NEGATIVE  NEGATIVE  URINE MICROSCOPIC-ADD ON      Result Value Range   Squamous Epithelial / LPF FEW (*) RARE   WBC, UA 0-2  <3 WBC/hpf   Bacteria, UA FEW (*) RARE   Dg Chest 2 View  09/21/2012   *RADIOLOGY REPORT*  Clinical Data: Cough.  CHEST - 2 VIEW  Comparison: 05/12/2012  Findings: Heart is borderline enlarged.  Patchy scattered bilateral opacities in the mid and lower lungs appear stable since prior study.  This could reflect areas of scarring/fibrosis.  No effusions.  No acute bony abnormality.  IMPRESSION: Stable scattered patchy bilateral mid and lower lung opacities, question scarring/fibrosis.  No real change since prior study.   Original Report Authenticated By: Charlett Nose, M.D.       MDM  Xr. Labs.  Reviewed nursing notes and prior charts for additional history.   Labs c/w baseline, x k low. kcl po. Po fluids.  cxr read by radiologist as being c/w prior xrays. Pt/fam note recent inc cough. Pt stating has cough at baseline, but more lately.  Recheck no increased wob, pulse ox room air 99%, rr 16.  Pt afeb. No chills/sweats.  Given recent inc cough, ?hx bronchitis, will rx abx.  On recheck, a couple v small (1 cm diam) bruises to left forearm, and one (3-4 cm diameter) to right forearm have not change in size since arrival to ed 4 hours earlier.  hgb and plt c/w baseline.  Pt states she feels ready for d/c to home.  rec close pcp f/u.  Pt appears stable for d/c from ed, denies any pain or dyspnea.         Suzi Roots, MD 09/21/12 1410

## 2012-09-21 NOTE — ED Notes (Addendum)
Patient reports that she has had a productive cough with light yellow sputum. Patient's granddaughter reports that the patient has had unusual bruising on bilateral forearms.

## 2012-11-01 ENCOUNTER — Ambulatory Visit (INDEPENDENT_AMBULATORY_CARE_PROVIDER_SITE_OTHER): Payer: Medicare Other | Admitting: Nurse Practitioner

## 2012-11-01 ENCOUNTER — Telehealth: Payer: Self-pay | Admitting: Internal Medicine

## 2012-11-01 ENCOUNTER — Encounter: Payer: Self-pay | Admitting: Nurse Practitioner

## 2012-11-01 VITALS — BP 160/80 | HR 70 | Ht 64.0 in | Wt 130.0 lb

## 2012-11-01 DIAGNOSIS — K6289 Other specified diseases of anus and rectum: Secondary | ICD-10-CM

## 2012-11-01 DIAGNOSIS — R198 Other specified symptoms and signs involving the digestive system and abdomen: Secondary | ICD-10-CM

## 2012-11-01 NOTE — Progress Notes (Signed)
HPI :   [name] Patient is a 79 year old female, new to this practice, here for evaluation of fecal incontinence. Patient had been in relatively good health until she was was hospitalized for several days in January with a distal small bowel obstruction. maging studies demonstrated a transition zone in the anterior central pelvis associated with bowel wall thickening of the point of obstruction. Surgery evaluated her, no surgical intervention was done, patient responded to nasogastric decompression. Her hospital course was complicated by diarrhea following resolution of bowel obstruction. C. difficile was negative x2.  Other complications included clostridium perfringens bacteremia and Escherichia coli urinary tract infection. During her hospital stay patient also developed problems with dysphasia. Chest x-ray 05/10/12 showed interval worsening of lung aeration, possibly from edema or bilateral infiltrates. Modified barium swallow compatible with moderate oral and mild pharyngeal phase dysphagia with a single episode of mild aspiration with thin liquids.  Patient here today with family evaluation of fecal incontinence which started while hospitalized in January. Prior to January patient was continent of stool at home. She has a history of chronic constipation so loose stools are new for her. Patient has to wear a depends diaper now. She does have some mild abdominal discomfort just prior to defecation. No rectal bleeding. She has not changed her medication regimen in any way. No recent dietary changes.  Past Medical History  Diagnosis Date  . Hypertension   . Irregular heart beat   . GERD (gastroesophageal reflux disease)   . Anxiety   . Arthritis   . Congestive heart failure   . Hepatitis A   . Bowel obstruction 04/2012  . Chronic UTI     Family History  Problem Relation Age of Onset  . Heart disease Mother   . Stroke Father   . Colon cancer Neg Hx    History  Substance Use Topics  .  Smoking status: Former Smoker -- 6 years    Types: Cigarettes  . Smokeless tobacco: Never Used  . Alcohol Use: No   Current Outpatient Prescriptions  Medication Sig Dispense Refill  . acetaminophen (TYLENOL) 500 MG tablet Take 500 mg by mouth every 6 (six) hours as needed for pain (pain).      . ATENOLOL PO Take 50 mg by mouth daily.       . folic acid (FOLVITE) 0.5 MG tablet Take 1 mg by mouth daily.      . potassium chloride SA (K-DUR,KLOR-CON) 20 MEQ tablet Take 20 mEq by mouth daily.       No current facility-administered medications for this visit.   Allergies  Allergen Reactions  . Neosporin (Neomycin-Bacitracin Zn-Polymyx)   . Other     Depression medicines: nightmares, rash, vertigo,  Unsure of rx names, has tried 2.  . Sulfa Antibiotics    Review of Systems: Positive for confusion, fatigue, swelling of feet and legs and urinary leakage. All other systems reviewed and negative except where noted in HPI.   Physical Exam: BP 160/80  Pulse 70  Ht 5\' 4"  (1.626 m)  Wt 130 lb (58.968 kg)  BMI 22.3 kg/m2 Constitutional: Pleasant,well-developed, white female in no acute distress. HEENT: Normocephalic and atraumatic. Conjunctivae are normal. No scleral icterus. Neck supple.  Cardiovascular: Normal rate, regular rhythm.  Pulmonary/chest: Effort normal and breath sounds normal. No wheezing, rales or rhonchi. Abdominal: Soft, mildly distended with active bowel sounds. Nontender. Bowel sounds active throughout. There are no masses palpable. No hepatomegaly. Rectal: Markedly abnormal DRE. There appeared to be a large  anorectal mass with luminal narrowing. Area was quite friable . Extremities: no edema Lymphadenopathy: No cervical adenopathy noted. Neurological: Alert and oriented to person place and time. Skin: Skin is warm and dry. No rashes noted. Psychiatric: Normal mood and affect. Behavior is normal.   ASSESSMENT AND PLAN:  1. Abnormal rectal exam. Friable anorectal mass  felt on digital exam. Rule out neoplasm. Patient has never had a full colonoscopy and prefers not to pursue one at her age. We discussed flexible sigmoidoscopy for further evaluation of rectal mass. Patient is agreeable to proceeding. The risks, benefits, and alternatives to flexible sigmoidoscopy with possible biopsy were discussed with the patient and her family and she consents to proceed.   2. Bowel change. Patient incontinent of loose stool since January. He has chronic constipation, this could be overflow diarrhea but bowel changes could certainly be secondary to #1 as well. Further recommendations following flexible sigmoidoscopy  3. History of distal small bowel obstruction January 2014 of unclear etiology. Patient denies any history of abdominal surgeries. Obstruction resolved with conservative measures

## 2012-11-01 NOTE — Telephone Encounter (Signed)
If the patient wants a GI appointment, set her up to see one of our extenders. Thanks

## 2012-11-01 NOTE — Patient Instructions (Addendum)
You have been scheduled for a flexible sigmoidoscopy. Please follow the written instructions given to you at your visit today. If you use inhalers (even only as needed), please bring them with you on the day of your procedure.  You will need to go to the pharmacy and get 1 bottle of Magnesium Citrate and 1 Fleet Enema for Tuesday 7-8  and Wed 7-9, the day of the procedure.

## 2012-11-01 NOTE — Telephone Encounter (Signed)
Pts son states that his mother has a constant leaking of loose stools. Requesting pt be seen. Pt scheduled to see Willette Cluster NP today at 3:30pm. Son will let us know if the appt will not work.

## 2012-11-02 ENCOUNTER — Encounter: Payer: Self-pay | Admitting: Gastroenterology

## 2012-11-02 ENCOUNTER — Encounter: Payer: Self-pay | Admitting: Nurse Practitioner

## 2012-11-02 NOTE — Progress Notes (Signed)
Reviewed and agree with management plan. I obtained a focused history, examined the patient and spoke with her and her family members.  Venita Lick. Russella Dar, MD [facility]

## 2012-11-03 ENCOUNTER — Encounter: Payer: Self-pay | Admitting: Gastroenterology

## 2012-11-03 ENCOUNTER — Other Ambulatory Visit: Payer: Self-pay

## 2012-11-03 ENCOUNTER — Ambulatory Visit (AMBULATORY_SURGERY_CENTER): Payer: Medicare Other | Admitting: Gastroenterology

## 2012-11-03 VITALS — BP 173/60 | HR 63 | Temp 99.6°F | Resp 18 | Ht 64.0 in | Wt 130.0 lb

## 2012-11-03 DIAGNOSIS — K6289 Other specified diseases of anus and rectum: Secondary | ICD-10-CM

## 2012-11-03 DIAGNOSIS — R198 Other specified symptoms and signs involving the digestive system and abdomen: Secondary | ICD-10-CM

## 2012-11-03 DIAGNOSIS — K6389 Other specified diseases of intestine: Secondary | ICD-10-CM

## 2012-11-03 DIAGNOSIS — R159 Full incontinence of feces: Secondary | ICD-10-CM

## 2012-11-03 DIAGNOSIS — R197 Diarrhea, unspecified: Secondary | ICD-10-CM

## 2012-11-03 MED ORDER — SODIUM CHLORIDE 0.9 % IV SOLN: 500.0000 mL | INTRAVENOUS | Status: DC

## 2012-11-03 NOTE — Progress Notes (Signed)
Patient did not have preoperative order for IV antibiotic SSI prophylaxis. (G8918)  Patient did not experience any of the following events: a burn prior to discharge; a fall within the facility; wrong site/side/patient/procedure/implant event; or a hospital transfer or hospital admission upon discharge from the facility. (G8907)  

## 2012-11-03 NOTE — Patient Instructions (Addendum)

## 2012-11-03 NOTE — Progress Notes (Signed)
Karl Bales, RN will notify patient of the CT scan scheduled for 11/05/12 2:00 at [facility]

## 2012-11-03 NOTE — Op Note (Signed)
[facility] [address] [place], [address]   FLEXIBLE SIGMOIDOSCOPY PROCEDURE REPORT  PATIENT: Lacey, Myers  MR#: [id number] BIRTHDATE: August 22, 1920 , 89  yrs. old GENDER: Female ENDOSCOPIST: Meryl Dare, MD, [facility] REFERRED BY: Lupe Carney PROCEDURE DATE:  11/03/2012 PROCEDURE:   Sigmoidoscopy with biopsy ASA CLASS:   Class II INDICATIONS: unexplained diarrhea, rectal mass, fecal incontinence MEDICATIONS: MAC sedation, administered by CRNA and propofol (Diprivan) 50mg  IV DESCRIPTION OF PROCEDURE:   After the risks benefits and alternatives of the procedure were thoroughly explained, informed consent was obtained. DRE revealed a palpable rectal mass with stenosis of the lumen. The [id number]  endoscope was introduced through the anus  and advanced to the sigmoid colon, limited by poor prep.  No adverse events experienced.   The quality of the prep was poor .  The instrument was then slowly withdrawn as the mucosa was fully examined.    COLON FINDINGS: A firm mass was found in the rectum from 5-10 cm with ulceration, stenosis and multilobulated features.  Multiple biopsies of the lesion were performed. The lesion was partially obstructing. The lumen measured 9-10 mm at the strictured segment. The visualized colonic mucosa appeared normal in the sigmoid colon but the majority of the mucosa was obscured by solid stool. Retroflexion was not performed due to a narrow rectal vault.  The scope was then withdrawn from the patient and the procedure terminated.  COMPLICATIONS: There were no complications.  ENDOSCOPIC IMPRESSION: 1.   Mass, partially obstructing in the rectum; multiple biopsies performed 2.   The colonic mucosa appeared normal in the sigmoid colon  RECOMMENDATIONS: 1.  Await biopsy results 2.  Miralax 1-2 times daily 3.  Chest/abd/pelvic CT 4.  Surgical and oncology referrals    eSigned:  Meryl Dare, MD, [facility]  11/03/2012 1:59 PM

## 2012-11-03 NOTE — Progress Notes (Signed)
Blood pressure rechecked 164/68.

## 2012-11-03 NOTE — Progress Notes (Signed)
Called to room to assist during endoscopic procedure.  Patient ID and intended procedure confirmed with present staff. Received instructions for my participation in the procedure from the performing physician.  

## 2012-11-03 NOTE — Progress Notes (Signed)
Pt's had a CMP on 09-21-12, with a BUN and Creatinine WNL.  Pt needs those lab values within 6 weeks, per [facility].  Per Dr. Russella Dar, if ok with radiologist, pt doesn't need another lab draw before CT.  Threasa Beards RN spoke with Misty Stanley at [facility], who states pt doesn't need another lab draw before CT

## 2012-11-04 ENCOUNTER — Telehealth: Payer: Self-pay | Admitting: *Deleted

## 2012-11-04 NOTE — Telephone Encounter (Signed)
  Follow up Call-  Call back number 11/03/2012  Post procedure Call Back phone  # [phone]  Permission to leave phone message Yes     Patient questions:  Do you have a fever, pain , or abdominal swelling? no Pain Score  0 *  Have you tolerated food without any problems? yes  Have you been able to return to your normal activities? yes  Do you have any questions about your discharge instructions: Diet   no Medications  no Follow up visit  no  Do you have questions or concerns about your Care? no  Actions: * If pain score is 4 or above: No action needed, pain <4.

## 2012-11-05 ENCOUNTER — Telehealth: Payer: Self-pay | Admitting: *Deleted

## 2012-11-05 ENCOUNTER — Ambulatory Visit (INDEPENDENT_AMBULATORY_CARE_PROVIDER_SITE_OTHER)
Admission: RE | Admit: 2012-11-05 | Discharge: 2012-11-05 | Disposition: A | Discharge status: Home / Self Care | Payer: Medicare Other | Source: Ambulatory Visit | Attending: Gastroenterology | Admitting: Gastroenterology

## 2012-11-05 DIAGNOSIS — K6289 Other specified diseases of anus and rectum: Secondary | ICD-10-CM

## 2012-11-05 DIAGNOSIS — R911 Solitary pulmonary nodule: Secondary | ICD-10-CM

## 2012-11-05 DIAGNOSIS — K6389 Other specified diseases of intestine: Secondary | ICD-10-CM

## 2012-11-05 MED ORDER — IOHEXOL 300 MG/ML  SOLN
100.0000 mL | Freq: Once | INTRAMUSCULAR | Status: AC | PRN
  Administered 2012-11-05: 100 mL via INTRAVENOUS

## 2012-11-05 MED ORDER — CIPROFLOXACIN HCL 250 MG PO TABS: 250.0000 mg | ORAL_TABLET | Freq: Two times a day (BID) | ORAL | Status: DC

## 2012-11-05 MED ORDER — METRONIDAZOLE 250 MG PO TABS: 250.0000 mg | ORAL_TABLET | Freq: Three times a day (TID) | ORAL | Status: DC

## 2012-11-05 NOTE — Telephone Encounter (Signed)
Per Willette Cluster, NP, Cipro 250 mg BID x 10 days, Flagyl 250 mg TID x 10 days, Miralax BID and referral to surgery: Dr. Michaell Cowing or Dr. Maisie Fus. Spoke with [facility] and scheduled on 11/10/12 at 9:15 for 9:40 AM with Dr. Maisie Fus. Patient and granddaughter given information. Rx's sent to pharmacy.

## 2012-11-06 ENCOUNTER — Telehealth: Payer: Self-pay | Admitting: Nurse Practitioner

## 2012-11-06 NOTE — Telephone Encounter (Signed)
Radiologist called with CTscan results late this afternoon, please see report. 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 The rectal mass biopsies were negative for cancer but mass is partially obstructing so surgical consult is still needed. I called [facility], spoke with Dr. Michaell Cowing. He or Dr. Maisie Fus will work patient in for appointment next week. For now, will treat with Cipro / flagyl. Will begin twice daily Miralax to help with constipation which is probably secondary to the partially obstructing rectal mass.

## 2012-11-08 ENCOUNTER — Encounter: Payer: Self-pay | Admitting: Gastroenterology

## 2012-11-10 ENCOUNTER — Ambulatory Visit (INDEPENDENT_AMBULATORY_CARE_PROVIDER_SITE_OTHER): Payer: Medicare Other | Admitting: General Surgery

## 2012-11-10 ENCOUNTER — Encounter (INDEPENDENT_AMBULATORY_CARE_PROVIDER_SITE_OTHER): Payer: Self-pay | Admitting: General Surgery

## 2012-11-10 VITALS — BP 126/70 | HR 64 | Temp 98.8°F | Resp 15 | Ht 64.75 in | Wt 129.2 lb

## 2012-11-10 DIAGNOSIS — K624 Stenosis of anus and rectum: Secondary | ICD-10-CM

## 2012-11-10 NOTE — Patient Instructions (Signed)
We will schedule you fo a diverting colostomy.  You will meet with the anesthesiologist and ostomy RN prior to surgery

## 2012-11-10 NOTE — Progress Notes (Signed)
Chief Complaint  Patient presents with  . New Evaluation    eval rectovaginal fistula/    HISTORY: [name] is a 79 y.o. female who presents to the office with a distal rectal mass.  This was found after a hospital admission in Jan where she had a distal obstruction.  She reports significant incontinence since this hospitalization.  Workup thus far has included a flexible sigmoidoscopy with biopsies and CT scan.  The CT shows a distal rectal mass with significant inflammation and presacral gas.  She denies any dysuria or gas or stool per vagina.  Her last UA was neg for infection, but her family states that she has had an increase in UTI's since hospitalization.  She does report increase in urinary incontinence as well.        Past Medical History  Diagnosis Date  . Hypertension   . Irregular heart beat   . GERD (gastroesophageal reflux disease)   . Anxiety   . Arthritis   . Congestive heart failure   . Hepatitis A   . Bowel obstruction 04/2012  . Chronic UTI       Past Surgical History  Procedure Laterality Date  . Cataract extraction Bilateral   . Dilatation & currettage/hysteroscopy with resectocope        Current Outpatient Prescriptions  Medication Sig Dispense Refill  . acetaminophen (TYLENOL) 500 MG tablet Take 500 mg by mouth every 6 (six) hours as needed for pain (pain).      . ATENOLOL PO Take 50 mg by mouth daily.       . ciprofloxacin (CIPRO) 250 MG tablet Take 1 tablet (250 mg total) by mouth 2 (two) times daily.  20 tablet  0  . folic acid (FOLVITE) 0.5 MG tablet Take 1 mg by mouth daily.      . metroNIDAZOLE (FLAGYL) 250 MG tablet Take 1 tablet (250 mg total) by mouth 3 (three) times daily.  30 tablet  0  . polyethylene glycol powder (MIRALAX) powder Take 17 g by mouth daily.      . potassium chloride SA (K-DUR,KLOR-CON) 20 MEQ tablet Take 20 mEq by mouth daily.       No current facility-administered medications for this visit.      Allergies  Allergen  Reactions  . Neosporin (Neomycin-Bacitracin Zn-Polymyx)   . Other     Depression medicines: nightmares, rash, vertigo,  Unsure of rx names, has tried 2.  . Sulfa Antibiotics   . Celexa (Citalopram Hydrobromide) Rash      Family History  Problem Relation Age of Onset  . Heart disease Mother   . Stroke Father   . Colon cancer Neg Hx       History   Social History  . Marital Status: Widowed    Spouse Name: N/A    Number of Children: 1  . Years of Education: N/A   Occupational History  . retired    Social History Main Topics  . Smoking status: Former Smoker -- 6 years    Types: Cigarettes    Quit date: 11/11/1938  . Smokeless tobacco: Never Used  . Alcohol Use: No  . Drug Use: No  . Sexually Active: None   Other Topics Concern  . None   Social History Narrative  . None       REVIEW OF SYSTEMS - PERTINENT POSITIVES ONLY: Review of Systems - General ROS: negative for - chills or fever Respiratory ROS: no cough, shortness of breath, or wheezing   Cardiovascular ROS: no chest pain or dyspnea on exertion Gastrointestinal ROS: positive for - change in bowel habits, stool incontinence and rectal pain negative for - nausea/vomiting Genito-Urinary ROS: positive for - incontinence and pelvic pain negative for - dysuria  EXAM: Filed Vitals:   11/10/12 0943  BP: 126/70  Pulse: 64  Temp: 98.8 F (37.1 C)  Resp: 15    Gen:  No acute distress.  Well nourished and well groomed.   Neurological: Alert and oriented to person, place, and time. Coordination normal.  Head: Normocephalic and atraumatic.  Eyes: Conjunctivae are normal. Pupils are equal, round, and reactive to light. No scleral icterus.  Neck: Normal range of motion. Neck supple. No tracheal deviation or thyromegaly present.  No cervical lymphadenopathy. Cardiovascular: Normal rate, regular rhythm, normal heart sounds and intact distal pulses.  Exam reveals no gallop and no friction rub.  No murmur  heard. Respiratory: Effort normal.  No respiratory distress. No chest wall tenderness. Breath sounds normal.  No wheezes, rales or rhonchi.  GI: Soft. Bowel sounds are normal. The abdomen is soft and nontender.  There is no rebound and no guarding.  Musculoskeletal: Normal range of motion. Extremities are nontender.  Skin: Skin is warm and dry. No rash noted. No diaphoresis. No erythema. No pallor. No clubbing, cyanosis, or edema.   Psychiatric: Normal mood and affect. Behavior is normal. Judgment and thought content normal.   Anorectal Exam: distal rectal stricture noted at ~5cm, hard stool accumulated proximally.   LABORATORY RESULTS: Available labs are reviewed  Lab Results  Component Value Date   WBC 9.6 09/21/2012   HGB 9.6* 09/21/2012   HCT 30.5* 09/21/2012   MCV 86.6 09/21/2012   PLT 272 09/21/2012   Lab Results  Component Value Date   CREATININE 0.66 09/21/2012     RADIOLOGY RESULTS:   Images and reports are reviewed. CT ABD IMPRESSION:  Extensive infectious/inflammatory changes in the pelvic floor  associated with circumferential wall thickening in the distal  sigmoid colon and rectum, at approximately the level of the  peritoneal reflection. This is associated with abnormal probable  granulation tissue in the presacral space with enhancement and a  small amount of gas tracking posteriorly from the rectum up into  the presacral soft tissues. There may be some minimal fluid in the  presacral space, but there is no distinct or drainable abscess at  this time. The inflammatory changes also obscure the fat plane  between the upper vagina and the rectum with gas visible in the  vaginal vault. This raises the question of a rectovaginal fistula.  Two subtle enhancing foci in the left liver, not visible on the  previous abdominal or chest CTs. Given the subtle appearance  today, the lack of visualization previously may be secondary to  differential bolus timing. If warranted,  abdominal MRI without and  with contrast could be used to further evaluate.    ASSESSMENT AND PLAN: [name] is a 79 y.o. F with a distal rectal stricture.  Biopsies have been inconclusive, but CT shows a definite inflammatory process in her pelvis with possible RVF.  It is unclear if this is a malignant process or inflammatory, but at her age, I believe they would be treated similarly.  I think she would benefit from a diverting colostomy.  I will wash out the rectum and possible get more biopsies at this time.  I think this will at least calm down the inflammation and will negate her incontinence.    I will have her see anesthesia preop for evaluation for anesthesia due to her advanced age.  I will also have the ostomy RN mark her prior to surgery.   The surgery and anatomy were described to the patient as well as the risks of surgery and the possible complications.  These include: Bleeding, infection and possible wound complications such as hernia, damage to adjacent structures, leak of surgical connections, which can lead to other surgeries, possible need for other procedures, such as abscess drains in radiology, possible prolonged hospital stay, possible constipation from narcotics, prolonged fatigue/weakness or appetite loss, possible early recurrence of cancer, possible complications of their medical problems such as heart disease or arrhythmias or lung problems, death (less than 1%). I believe the patient understands and wishes to proceed with the surgery.     Hasini Peachey C Zanita Millman, MD Colon and Rectal Surgery / General Surgery [facility]      Visit Diagnoses: No diagnosis found.  Primary Care Physician: TRIPP, HENRY, MD    

## 2012-11-22 ENCOUNTER — Encounter (HOSPITAL_COMMUNITY): Payer: Self-pay | Admitting: Pharmacy Technician

## 2012-11-24 ENCOUNTER — Encounter (HOSPITAL_COMMUNITY)
Admission: RE | Admit: 2012-11-24 | Discharge: 2012-11-24 | Disposition: A | Discharge status: Home / Self Care | Payer: Medicare Other | Source: Ambulatory Visit | Attending: General Surgery | Admitting: General Surgery

## 2012-11-24 ENCOUNTER — Other Ambulatory Visit (HOSPITAL_COMMUNITY): Payer: Self-pay | Admitting: General Surgery

## 2012-11-24 ENCOUNTER — Telehealth (INDEPENDENT_AMBULATORY_CARE_PROVIDER_SITE_OTHER): Payer: Self-pay | Admitting: General Surgery

## 2012-11-24 ENCOUNTER — Encounter (HOSPITAL_COMMUNITY): Payer: Self-pay

## 2012-11-24 HISTORY — DX: Spontaneous ecchymoses: R23.3

## 2012-11-24 HISTORY — DX: Other skin changes: R23.8

## 2012-11-24 LAB — COMPREHENSIVE METABOLIC PANEL
ALT: 5 U/L (ref 0–35)
Calcium: 9.5 mg/dL (ref 8.4–10.5)
Creatinine, Ser: 0.8 mg/dL (ref 0.50–1.10)
GFR calc Af Amer: 73 mL/min — ABNORMAL LOW (ref >90)
Glucose, Bld: 99 mg/dL (ref 70–99)
Sodium: 140 mEq/L (ref 135–145)
Total Protein: 8.4 g/dL — ABNORMAL HIGH (ref 6.0–8.3)

## 2012-11-24 LAB — CBC WITH DIFFERENTIAL/PLATELET
Eosinophils Absolute: 0 10*3/uL (ref 0.0–0.7)
Lymphocytes Relative: 40 % (ref 12–46)
Lymphs Abs: 2.1 10*3/uL (ref 0.7–4.0)
MCH: 26.1 pg (ref 26.0–34.0)
Neutrophils Relative %: 36 % — ABNORMAL LOW (ref 43–77)
Platelets: 213 10*3/uL (ref 150–400)
RBC: 4.02 MIL/uL (ref 3.87–5.11)
WBC: 5.3 10*3/uL (ref 4.0–10.5)

## 2012-11-24 LAB — CEA: CEA: 2.3 ng/mL (ref 0.0–5.0)

## 2012-11-24 LAB — ABO/RH: ABO/RH(D): A NEG

## 2012-11-24 NOTE — Progress Notes (Signed)
LOV Dr Donnie Aho 5/13 with eccho8/12 chart,  Chest CT 7/14 EPIC, EKG 1/14 EPIC with EKG

## 2012-11-24 NOTE — Progress Notes (Signed)
Order for potassium placed after speaking with [name] at [facility]/Dr [facility].  [name] stated paient would be notified by them

## 2012-11-24 NOTE — Telephone Encounter (Signed)
Verlon Au from [facility] pre[facility] called to let us know that the patient didn't know anything about a bowel prep and they were unsure if the patient needed one according to Dr. Maisie Fus.  She also wanted to let us know that the pt had a critical lab value of K+ 2.9.  After speaking with Dr. Maisie Fus about this she explained the patient does not need a bowel prep and she want 20 meq Kdur called into the pharmacy for the patient w/ instructions of her to take 2 tabs today and 2 tabs tomorrow with a redraw of K+ level the morning of surgery.  I called the patient and informed her of this information as well as Verlon Au to let her know they need a redraw of the lab.

## 2012-11-24 NOTE — Consult Note (Signed)
WOC ostomy consult for preoperative stoma site selection per Dr. Maisie Fus' request.  Procedure scheduled for Friday. 11/26/12. Patient in wheelchair; is able to walk short distances, but is fatigued today. Son present. Both tearful when discussing surgery.  Patient's granddaughter (son's daughter) is a CNA at [employer] hospital and is currently living with the patient.Patient wearing an adult containment brief for urinary and fecal incontinence. Patients abdomen observed in the seated position with her leaning forward and then back so that I could observe abdominal movement as well as alterations in the potential stomal plane.Findings:  Abdominal skin is flaccid; there are numerous wrinkles and skin folds in the area of the umbilicus. There are no previous scars; patient states she has never had surgery (except for bilateral cataract removal and a D&C procedure approximately 50 years ago). The abdominal rectus muscle is narrow and weak.  Sites (2) selected are on either side of the umbilicus. They are marked with a skin marking pen and covered with a thin film transparent dressing.  Right side:  4cm to the right of the umbilicus and 2.5cm below. Left side:  5cm to the left of the umbilicus and 1.5cm below.  Education provided: Patient and son taught basic A&P and stoma and pouch characteristics.  They were shown a sample ostomy pouch and patient and son both expressed their desire preference to use a disposable (vs a pouch that requires emptying) post operatively if a stoma is created. Neither has even known anyone who had an ostomy, but there are strong preconceived ideas about ostomy products. WOC Nursing Team will follow as needed.  IN the event a stoma is created, please re-consult. Thanks, Ladona Mow, MSN, RN, GNP, [facility], [facility]   [phone])

## 2012-11-24 NOTE — Patient Instructions (Addendum)
20 [name]  11/24/2012   Your procedure is scheduled on:  11/26/12  FRIDAY  Report to [facility] at 0830     AM.  Call this number if you have problems the morning of surgery: [phone]       Remember:   Do not  Drink  Or eat  :After Midnight. Thursday NIGHT   Take these medicines the morning of surgery with A SIP OF WATER: ATENOLOL   .  Contacts, dentures or partial plates can not be worn to surgery  Leave suitcase in the car. After surgery it may be brought to your room.  For patients admitted to the hospital, checkout time is 11:00 AM day of  discharge.             SPECIAL INSTRUCTIONS- SEE [place] PREPARING FOR SURGERY INSTRUCTION SHEET-     DO NOT WEAR JEWELRY, LOTIONS, POWDERS, OR PERFUMES.  WOMEN-- DO NOT SHAVE LEGS OR UNDERARMS FOR 12 HOURS BEFORE SHOWERS. MEN MAY SHAVE FACE.  Patients discharged the day of surgery will not be allowed to drive home. IF going home the day of surgery, you must have a driver and someone to stay with you for the first 24 hours  Name and phone number of your driver:     Admission                                                                   Please read over the following fact sheets that you were given Incentive Spirometry Sheet, Blood Transfusion Sheet  Information                                                                                   Dmarco Baldus  PST 336  [phone]                 FAILURE TO FOLLOW THESE INSTRUCTIONS MAY RESULT IN  CANCELLATION   OF YOUR SURGERY                                                  Patient Signature _____________________________

## 2012-11-24 NOTE — Progress Notes (Signed)
SPOKE WITH KENDALL AT [facility]- NOTIFIED HER OF POTASSIUM OF 2.9- states will notify Dr Maisie Fus

## 2012-11-24 NOTE — Progress Notes (Signed)
Spoke with Dr Marjo Bicker who stated he would see patient morning of surgery.  Spoke with [name] at Dr Maisie Fus office- informed that the patient and son are not aware of any bowel prep if needed. Stated they will call patient if needed. Son stated his mom has been seen at home since her Jan 14 hospitalization by Dr Tripp/[facility].[phone]. Will attempt to get old records

## 2012-11-25 NOTE — Progress Notes (Signed)
Received home care visit from 10/12/12 J Haynie ANP- on chart

## 2012-11-26 ENCOUNTER — Encounter (HOSPITAL_COMMUNITY): Payer: Self-pay | Admitting: Registered Nurse

## 2012-11-26 ENCOUNTER — Inpatient Hospital Stay (HOSPITAL_COMMUNITY)
Admission: RE | Admit: 2012-11-26 | Discharge: 2012-12-01 | DRG: 331 | Disposition: A | Discharge status: Home / Self Care | Payer: Medicare Other | Source: Ambulatory Visit | Attending: General Surgery | Admitting: General Surgery

## 2012-11-26 ENCOUNTER — Inpatient Hospital Stay (HOSPITAL_COMMUNITY): Payer: Medicare Other | Admitting: Registered Nurse

## 2012-11-26 ENCOUNTER — Encounter (HOSPITAL_COMMUNITY)
Admission: RE | Disposition: A | Discharge status: Home / Self Care | Payer: Self-pay | Source: Ambulatory Visit | Attending: General Surgery

## 2012-11-26 ENCOUNTER — Encounter (HOSPITAL_COMMUNITY): Payer: Self-pay

## 2012-11-26 DIAGNOSIS — K5730 Diverticulosis of large intestine without perforation or abscess without bleeding: Secondary | ICD-10-CM | POA: Diagnosis present

## 2012-11-26 DIAGNOSIS — Z792 Long term (current) use of antibiotics: Secondary | ICD-10-CM

## 2012-11-26 DIAGNOSIS — D128 Benign neoplasm of rectum: Secondary | ICD-10-CM

## 2012-11-26 DIAGNOSIS — K624 Stenosis of anus and rectum: Secondary | ICD-10-CM

## 2012-11-26 DIAGNOSIS — Z79899 Other long term (current) drug therapy: Secondary | ICD-10-CM

## 2012-11-26 DIAGNOSIS — K219 Gastro-esophageal reflux disease without esophagitis: Secondary | ICD-10-CM | POA: Diagnosis present

## 2012-11-26 DIAGNOSIS — I509 Heart failure, unspecified: Secondary | ICD-10-CM | POA: Diagnosis present

## 2012-11-26 DIAGNOSIS — D129 Benign neoplasm of anus and anal canal: Secondary | ICD-10-CM

## 2012-11-26 DIAGNOSIS — F411 Generalized anxiety disorder: Secondary | ICD-10-CM | POA: Diagnosis present

## 2012-11-26 DIAGNOSIS — I10 Essential (primary) hypertension: Secondary | ICD-10-CM | POA: Diagnosis present

## 2012-11-26 HISTORY — PX: LAPAROSCOPIC DIVERTED COLOSTOMY: SHX5892

## 2012-11-26 LAB — POTASSIUM: Potassium: 3.2 mEq/L — ABNORMAL LOW (ref 3.5–5.1)

## 2012-11-26 LAB — TYPE AND SCREEN: ABO/RH(D): A NEG

## 2012-11-26 SURGERY — CREATION, COLOSTOMY, DIVERTING, LAPAROSCOPIC
Anesthesia: General | Wound class: Clean Contaminated

## 2012-11-26 MED ORDER — ONDANSETRON HCL 4 MG/2ML IJ SOLN
4.0000 mg | Freq: Four times a day (QID) | INTRAMUSCULAR | Status: DC | PRN
  Administered 2012-11-26 – 2012-11-29 (×8): 4 mg via INTRAVENOUS
  Filled 2012-11-26 (×8): qty 2

## 2012-11-26 MED ORDER — KCL IN DEXTROSE-NACL 20-5-0.45 MEQ/L-%-% IV SOLN
INTRAVENOUS | Status: DC
Start: 2012-11-26 — End: 2012-12-01
  Administered 2012-11-26 – 2012-12-01 (×5): via INTRAVENOUS
  Filled 2012-11-26 (×5): qty 1000

## 2012-11-26 MED ORDER — HEPARIN SODIUM (PORCINE) 5000 UNIT/ML IJ SOLN
5000.0000 [IU] | Freq: Once | INTRAMUSCULAR | Status: AC
  Administered 2012-11-26: 5000 [IU] via SUBCUTANEOUS
  Filled 2012-11-26: qty 1

## 2012-11-26 MED ORDER — LIDOCAINE HCL 4 % MT SOLN
OROMUCOSAL | Status: DC | PRN
  Administered 2012-11-26: 4 mL via TOPICAL

## 2012-11-26 MED ORDER — CARBOXYMETHYLCELLUL-GLYCERIN 0.5-0.9 % OP SOLN: 1.0000 [drp] | Freq: Every day | OPHTHALMIC | Status: DC | PRN

## 2012-11-26 MED ORDER — ONDANSETRON HCL 4 MG PO TABS: 4.0000 mg | ORAL_TABLET | Freq: Four times a day (QID) | ORAL | Status: DC | PRN

## 2012-11-26 MED ORDER — LACTATED RINGERS IR SOLN
Status: DC | PRN
  Administered 2012-11-26: 1000 mL

## 2012-11-26 MED ORDER — HYDROMORPHONE HCL PF 1 MG/ML IJ SOLN
0.2500 mg | INTRAMUSCULAR | Status: DC | PRN
  Administered 2012-11-26 (×2): 0.25 mg via INTRAVENOUS

## 2012-11-26 MED ORDER — ATENOLOL 50 MG PO TABS
50.0000 mg | ORAL_TABLET | Freq: Every day | ORAL | Status: DC
  Administered 2012-11-27 – 2012-12-01 (×5): 50 mg via ORAL
  Filled 2012-11-26 (×6): qty 1

## 2012-11-26 MED ORDER — POTASSIUM GLUCONATE 595 (99 K) MG PO TABS
595.0000 mg | ORAL_TABLET | Freq: Every day | ORAL | Status: DC
  Administered 2012-11-26 – 2012-11-28 (×3): 595 mg via ORAL
  Filled 2012-11-26 (×4): qty 1

## 2012-11-26 MED ORDER — CEFAZOLIN SODIUM-DEXTROSE 2-3 GM-% IV SOLR
2.0000 g | INTRAVENOUS | Status: AC
  Administered 2012-11-26: 2 g via INTRAVENOUS

## 2012-11-26 MED ORDER — GLYCOPYRROLATE 0.2 MG/ML IJ SOLN
INTRAMUSCULAR | Status: DC | PRN
  Administered 2012-11-26: 0.2 mg via INTRAVENOUS
  Administered 2012-11-26: 0.6 mg via INTRAVENOUS

## 2012-11-26 MED ORDER — ROCURONIUM BROMIDE 100 MG/10ML IV SOLN
INTRAVENOUS | Status: DC | PRN
  Administered 2012-11-26: 40 mg via INTRAVENOUS

## 2012-11-26 MED ORDER — DOCUSATE SODIUM 100 MG PO CAPS
100.0000 mg | ORAL_CAPSULE | Freq: Every morning | ORAL | Status: DC
  Administered 2012-11-27 – 2012-12-01 (×5): 100 mg via ORAL
  Filled 2012-11-26 (×5): qty 1

## 2012-11-26 MED ORDER — ALVIMOPAN 12 MG PO CAPS
12.0000 mg | ORAL_CAPSULE | Freq: Two times a day (BID) | ORAL | Status: DC
  Administered 2012-11-27 – 2012-11-28 (×3): 12 mg via ORAL
  Filled 2012-11-26 (×4): qty 1

## 2012-11-26 MED ORDER — BUPIVACAINE-EPINEPHRINE 0.5% -1:200000 IJ SOLN
INTRAMUSCULAR | Status: DC | PRN
  Administered 2012-11-26: 7 mL

## 2012-11-26 MED ORDER — OXYCODONE HCL 5 MG PO TABS: 5.0000 mg | ORAL_TABLET | Freq: Once | ORAL | Status: DC | PRN

## 2012-11-26 MED ORDER — HYDROMORPHONE HCL PF 1 MG/ML IJ SOLN
0.5000 mg | INTRAMUSCULAR | Status: DC | PRN
  Administered 2012-11-26 – 2012-12-01 (×11): 0.5 mg via INTRAVENOUS
  Filled 2012-11-26 (×11): qty 1

## 2012-11-26 MED ORDER — OXYCODONE HCL 5 MG/5ML PO SOLN
5.0000 mg | Freq: Once | ORAL | Status: DC | PRN
  Filled 2012-11-26: qty 5

## 2012-11-26 MED ORDER — ENOXAPARIN SODIUM 40 MG/0.4ML ~~LOC~~ SOLN
40.0000 mg | SUBCUTANEOUS | Status: DC
  Administered 2012-11-27 – 2012-12-01 (×5): 40 mg via SUBCUTANEOUS
  Filled 2012-11-26 (×6): qty 0.4

## 2012-11-26 MED ORDER — MEPERIDINE HCL 50 MG/ML IJ SOLN: 6.2500 mg | INTRAMUSCULAR | Status: DC | PRN

## 2012-11-26 MED ORDER — HYDRALAZINE HCL 20 MG/ML IJ SOLN
INTRAMUSCULAR | Status: DC | PRN
  Administered 2012-11-26 (×3): 5 mg via INTRAVENOUS

## 2012-11-26 MED ORDER — LIDOCAINE HCL (CARDIAC) 20 MG/ML IV SOLN
INTRAVENOUS | Status: DC | PRN
  Administered 2012-11-26: 20 mg via INTRAVENOUS

## 2012-11-26 MED ORDER — PROMETHAZINE HCL 25 MG/ML IJ SOLN: 6.2500 mg | INTRAMUSCULAR | Status: DC | PRN

## 2012-11-26 MED ORDER — PNEUMOCOCCAL VAC POLYVALENT 25 MCG/0.5ML IJ INJ
0.5000 mL | INJECTION | INTRAMUSCULAR | Status: AC
  Filled 2012-11-26 (×2): qty 0.5

## 2012-11-26 MED ORDER — FOLIC ACID 1 MG PO TABS
1.0000 mg | ORAL_TABLET | Freq: Every day | ORAL | Status: DC
  Administered 2012-11-26 – 2012-12-01 (×6): 1 mg via ORAL
  Filled 2012-11-26 (×6): qty 1

## 2012-11-26 MED ORDER — PROPOFOL 10 MG/ML IV BOLUS
INTRAVENOUS | Status: DC | PRN
  Administered 2012-11-26: 110 mg via INTRAVENOUS
  Administered 2012-11-26: 30 mg via INTRAVENOUS

## 2012-11-26 MED ORDER — 0.9 % SODIUM CHLORIDE (POUR BTL) OPTIME
TOPICAL | Status: DC | PRN
  Administered 2012-11-26: 2000 mL

## 2012-11-26 MED ORDER — FENTANYL CITRATE 0.05 MG/ML IJ SOLN
INTRAMUSCULAR | Status: DC | PRN
  Administered 2012-11-26: 50 ug via INTRAVENOUS
  Administered 2012-11-26 (×2): 100 ug via INTRAVENOUS

## 2012-11-26 MED ORDER — ONDANSETRON HCL 4 MG/2ML IJ SOLN
INTRAMUSCULAR | Status: DC | PRN
  Administered 2012-11-26: 4 mg via INTRAVENOUS

## 2012-11-26 MED ORDER — METRONIDAZOLE IN NACL 5-0.79 MG/ML-% IV SOLN
500.0000 mg | INTRAVENOUS | Status: AC
  Administered 2012-11-26: 500 mg via INTRAVENOUS

## 2012-11-26 MED ORDER — ALVIMOPAN 12 MG PO CAPS
12.0000 mg | ORAL_CAPSULE | Freq: Once | ORAL | Status: AC
  Administered 2012-11-26: 12 mg via ORAL
  Filled 2012-11-26: qty 1

## 2012-11-26 MED ORDER — POLYVINYL ALCOHOL 1.4 % OP SOLN
1.0000 [drp] | OPHTHALMIC | Status: DC | PRN
  Administered 2012-11-29: 1 [drp] via OPHTHALMIC
  Filled 2012-11-26: qty 15

## 2012-11-26 MED ORDER — NEOSTIGMINE METHYLSULFATE 1 MG/ML IJ SOLN
INTRAMUSCULAR | Status: DC | PRN
  Administered 2012-11-26: 3 mg via INTRAVENOUS

## 2012-11-26 MED ORDER — HYDROCODONE-ACETAMINOPHEN 5-325 MG PO TABS
1.0000 | ORAL_TABLET | ORAL | Status: DC | PRN
  Administered 2012-11-27: 1 via ORAL
  Administered 2012-11-28 (×3): 2 via ORAL
  Administered 2012-11-29 (×2): 1 via ORAL
  Administered 2012-11-30 (×2): 2 via ORAL
  Administered 2012-11-30: 1 via ORAL
  Administered 2012-12-01: 2 via ORAL
  Filled 2012-11-26 (×2): qty 2
  Filled 2012-11-26: qty 1
  Filled 2012-11-26 (×2): qty 2
  Filled 2012-11-26 (×2): qty 1
  Filled 2012-11-26 (×2): qty 2
  Filled 2012-11-26: qty 1

## 2012-11-26 MED ORDER — LACTATED RINGERS IV SOLN
INTRAVENOUS | Status: DC
  Administered 2012-11-26 (×2): via INTRAVENOUS

## 2012-11-26 SURGICAL SUPPLY — 69 items
APPLIER CLIP 5 13 M/L LIGAMAX5 (MISCELLANEOUS) ×2
BLADE EXTENDED COATED 6.5IN (ELECTRODE) ×2 IMPLANT
BLADE HEX COATED 2.75 (ELECTRODE) ×4 IMPLANT
BLADE SURG SZ10 CARB STEEL (BLADE) ×2 IMPLANT
CABLE HIGH FREQUENCY MONO STRZ (ELECTRODE) ×2 IMPLANT
CANISTER SUCTION 2500CC (MISCELLANEOUS) ×2 IMPLANT
CATH ROBINSON RED A/P 16FR (CATHETERS) ×2 IMPLANT
CELLS DAT CNTRL 66122 CELL SVR (MISCELLANEOUS) IMPLANT
CHLORAPREP W/TINT 26ML (MISCELLANEOUS) ×2 IMPLANT
CLIP APPLIE 5 13 M/L LIGAMAX5 (MISCELLANEOUS) ×1 IMPLANT
CLOTH BEACON ORANGE TIMEOUT ST (SAFETY) ×2 IMPLANT
COVER MAYO STAND STRL (DRAPES) ×4 IMPLANT
DECANTER SPIKE VIAL GLASS SM (MISCELLANEOUS) ×2 IMPLANT
DERMABOND ADVANCED (GAUZE/BANDAGES/DRESSINGS) ×2
DERMABOND ADVANCED .7 DNX12 (GAUZE/BANDAGES/DRESSINGS) ×2 IMPLANT
DRAIN CHANNEL 19F RND (DRAIN) IMPLANT
DRAPE LAPAROSCOPIC ABDOMINAL (DRAPES) ×2 IMPLANT
DRAPE LG THREE QUARTER DISP (DRAPES) IMPLANT
DRAPE UTILITY 15X26 (DRAPE) ×4 IMPLANT
DRAPE WARM FLUID 44X44 (DRAPE) ×2 IMPLANT
ELECT REM PT RETURN 9FT ADLT (ELECTROSURGICAL) ×2
ELECTRODE REM PT RTRN 9FT ADLT (ELECTROSURGICAL) ×1 IMPLANT
EVACUATOR SILICONE 100CC (DRAIN) ×2 IMPLANT
GLOVE BIO SURGEON STRL SZ 6.5 (GLOVE) ×6 IMPLANT
GLOVE BIOGEL PI IND STRL 7.0 (GLOVE) ×2 IMPLANT
GLOVE BIOGEL PI INDICATOR 7.0 (GLOVE) ×2
GOWN BRE IMP PREV XXLGXLNG (GOWN DISPOSABLE) ×4 IMPLANT
GOWN STRL REIN XL XLG (GOWN DISPOSABLE) ×8 IMPLANT
KIT BASIN OR (CUSTOM PROCEDURE TRAY) ×2 IMPLANT
LEGGING LITHOTOMY PAIR STRL (DRAPES) ×2 IMPLANT
LIGASURE IMPACT 36 18CM CVD LR (INSTRUMENTS) IMPLANT
NS IRRIG 1000ML POUR BTL (IV SOLUTION) ×2 IMPLANT
PENCIL BUTTON HOLSTER BLD 10FT (ELECTRODE) ×4 IMPLANT
RTRCTR WOUND ALEXIS 18CM MED (MISCELLANEOUS)
SCISSORS LAP 5X35 DISP (ENDOMECHANICALS) ×2 IMPLANT
SEALER TISSUE G2 CVD JAW 35 (ENDOMECHANICALS) IMPLANT
SEALER TISSUE G2 CVD JAW 45CM (ENDOMECHANICALS)
SET IRRIG TUBING LAPAROSCOPIC (IRRIGATION / IRRIGATOR) ×2 IMPLANT
SLEEVE XCEL OPT CAN 5 100 (ENDOMECHANICALS) ×4 IMPLANT
SOLUTION ANTI FOG 6CC (MISCELLANEOUS) ×2 IMPLANT
SPONGE GAUZE 4X4 12PLY (GAUZE/BANDAGES/DRESSINGS) ×2 IMPLANT
SPONGE LAP 18X18 X RAY DECT (DISPOSABLE) ×4 IMPLANT
STAPLER VISISTAT 35W (STAPLE) ×2 IMPLANT
SUCTION POOLE TIP (SUCTIONS) ×2 IMPLANT
SUT ETHILON 2 0 PS N (SUTURE) ×2 IMPLANT
SUT MNCRL AB 4-0 PS2 18 (SUTURE) ×2 IMPLANT
SUT NOVA NAB DX-16 0-1 5-0 T12 (SUTURE) IMPLANT
SUT PDS AB 1 CTX 36 (SUTURE) ×4 IMPLANT
SUT PDS AB 1 TP1 96 (SUTURE) IMPLANT
SUT PROLENE 2 0 KS (SUTURE) IMPLANT
SUT SILK 2 0 (SUTURE) ×1
SUT SILK 2 0 SH CR/8 (SUTURE) IMPLANT
SUT SILK 2-0 18XBRD TIE 12 (SUTURE) ×1 IMPLANT
SUT SILK 3 0 (SUTURE) ×1
SUT SILK 3 0 SH CR/8 (SUTURE) IMPLANT
SUT SILK 3-0 18XBRD TIE 12 (SUTURE) ×1 IMPLANT
SUT VIC AB 2-0 SH 18 (SUTURE) ×4 IMPLANT
SUT VIC AB 4-0 PS2 27 (SUTURE) ×2 IMPLANT
SUT VICRYL 2 0 18  UND BR (SUTURE)
SUT VICRYL 2 0 18 UND BR (SUTURE) IMPLANT
SYS LAPSCP GELPORT 120MM (MISCELLANEOUS)
SYSTEM LAPSCP GELPORT 120MM (MISCELLANEOUS) IMPLANT
TOWEL OR 17X26 10 PK STRL BLUE (TOWEL DISPOSABLE) ×4 IMPLANT
TRAY FOLEY CATH 14FRSI W/METER (CATHETERS) ×2 IMPLANT
TRAY LAP CHOLE (CUSTOM PROCEDURE TRAY) ×2 IMPLANT
TROCAR BLADELESS OPT 5 100 (ENDOMECHANICALS) ×2 IMPLANT
TROCAR XCEL BLUNT TIP 100MML (ENDOMECHANICALS) ×2 IMPLANT
TUBING FILTER THERMOFLATOR (ELECTROSURGICAL) ×2 IMPLANT
YANKAUER SUCT BULB TIP 10FT TU (MISCELLANEOUS) ×4 IMPLANT

## 2012-11-26 NOTE — Interval H&P Note (Signed)
History and Physical Interval Note:  11/26/2012 11:45 AM  [name]  has presented today for surgery, with the diagnosis of distal rectal stricture  The various methods of treatment have been discussed with the patient and family. After consideration of risks, benefits and other options for treatment, the patient has consented to  Procedure(s): LAPAROSCOPIC COLOSTOMY PLACEMENT RIGID  SIGMOIDOSCOPY (N/A) as a surgical intervention .  The patient's history has been reviewed, patient examined, no change in status, stable for surgery.  I have reviewed the patient's chart and labs.  Questions were answered to the patient's satisfaction.   Her preop labs showed a low K.  We called in oral replacements.  Her recheck today was 3.2.  I think she is in the best condition that is possible and we should proceed with surgery.  Vanita Panda, MD  Colorectal and General Surgery [facility]

## 2012-11-26 NOTE — Anesthesia Postprocedure Evaluation (Signed)
  Anesthesia Post-op Note  Patient: [name]  Procedure(s) Performed: Procedure(s) (LRB): LAPAROSCOPIC COLOSTOMY PLACEMENT RIGID  SIGMOIDOSCOPY (N/A)  Patient Location: PACU  Anesthesia Type: General  Level of Consciousness: awake and alert   Airway and Oxygen Therapy: Patient Spontanous Breathing  Post-op Pain: mild  Post-op Assessment: Post-op Vital signs reviewed, Patient's Cardiovascular Status Stable, Respiratory Function Stable, Patent Airway and No signs of Nausea or vomiting  Last Vitals:  Filed Vitals:   11/26/12 1515  BP: 165/65  Pulse: 66  Temp: 36.6 C  Resp: 14    Post-op Vital Signs: stable   Complications: No apparent anesthesia complications

## 2012-11-26 NOTE — Anesthesia Procedure Notes (Signed)
Procedure Name: Intubation Date/Time: 11/26/2012 12:38 PM Performed by: Leroy Libman L Patient Re-evaluated:Patient Re-evaluated prior to inductionOxygen Delivery Method: Circle system utilized Preoxygenation: Pre-oxygenation with 100% oxygen Intubation Type: IV induction Ventilation: Mask ventilation without difficulty and Oral airway inserted - appropriate to patient size Laryngoscope Size: Hyacinth Meeker and 2 Grade View: Grade I Tube type: Oral Tube size: 7.5 mm Number of attempts: 1 Airway Equipment and Method: Stylet Placement Confirmation: ETT inserted through vocal cords under direct vision,  breath sounds checked- equal and bilateral and positive ETCO2 Secured at: 21 cm Tube secured with: Tape Dental Injury: Teeth and Oropharynx as per pre-operative assessment

## 2012-11-26 NOTE — Transfer of Care (Signed)
Immediate Anesthesia Transfer of Care Note  Patient: [name]  Procedure(s) Performed: Procedure(s): LAPAROSCOPIC COLOSTOMY PLACEMENT RIGID  SIGMOIDOSCOPY (N/A)  Patient Location: PACU  Anesthesia Type:General  Level of Consciousness: awake, alert , patient cooperative and responds to stimulation  Airway & Oxygen Therapy: Patient Spontanous Breathing and Patient connected to face mask oxygen  Post-op Assessment: Report given to PACU RN, Post -op Vital signs reviewed and stable and Patient moving all extremities X 4  Post vital signs: stable  Complications: No apparent anesthesia complications

## 2012-11-26 NOTE — Op Note (Signed)
11/26/2012  2:26 PM  PATIENT:  [name]  79 y.o. female  Patient Care Team: Florentina Jenny, MD as PCP - General (Family Medicine)  PRE-OPERATIVE DIAGNOSIS:  distal rectal stricture  POST-OPERATIVE DIAGNOSIS:  distal rectal stricture  PROCEDURE:  Procedure(s): LAPAROSCOPIC COLOSTOMY PLACEMENT  RIGID  SIGMOIDOSCOPY  SURGEON:  Surgeon(s): Romie Levee, MD  ASSISTANT: none   ANESTHESIA:   general  EBL: 50ml  Total I/O In: 1000 [I.V.:1000] Out: 400 [Urine:350; Blood:50]  Delay start of Pharmacological VTE agent (>24hrs) due to surgical blood loss or risk of bleeding:  no  DRAINS: none   SPECIMEN:  No Specimen  DISPOSITION OF SPECIMEN:  N/A  COUNTS:  YES  PLAN OF CARE: Admit to inpatient   PATIENT DISPOSITION:  PACU - hemodynamically stable.  INDICATION: this is a 79 year old female who has developed a distal rectal stricture. A CT scan reveals significant pelvic inflammation and she is having quite a bit of pelvic pain, incontinence to urine and stool and frequent UTIs. There is concern for a rectovaginal fistula. A long discussion in the office about treatment for this. And we elected to proceed with a diverting colostomy. The risk and benefits were explained to the patient per the OR consent was signed and placed on chart.  OR FINDINGS: significant sigmoid diverticular disease without inflammation. No pelvic masses could be identified. Uterus and ovaries were intact and appeared normal for her age. There were no other abnormalities identified in the patient's abdomen.  DESCRIPTION: The patient was identified in the preoperative holding area and taken to the OR where they were laid supine on the operating room table in lithotomy position. Gen. anesthesia was smoothly induced.  The patient was then prepped and draped in the usual sterile fashion. A surgical timeout was performed indicating the correct patient, procedure, positioning and preoperative antibiotics. SCDs  were noted to be in place and functioning prior to the operation.  I began the procedure by doing a rigid proctoscopy. I was able to visualize the stricture but was unable to traverse this. The rectum was irrigated with saline. A biopsy of the stricture was obtained through the proctoscope. This was sent to pathology for further examination. The stricture was at approximately 5 cm and allowed passage of the tip of my finger only. It did not look distinctly malignant.  After this was completed the patient's abdomen was prepped and draped in usual sterile fashion. She was marked by the ostomy nurse prior to the procedure, and these sites remained intact throughout the prep. After this was completed a incision was made in the left upper quadrant just underneath the last rib using an 11 blade scalpel. I then entered the abdomen using the Optiview trocar. Injury was clean. The abdomen insufflated without difficulty. We then evaluated the abdomen with the laparoscope. The liver appeared normal with no masses. The small and large bowel also appeared normal. I did not run the entire small bowel. I evaluated the pelvis. Her uterus and ovaries were intact and appeared normal for her stated age. There was a large redundant sigmoid with diverticula. The sigmoid was not adherent to the rectum.  After this I placed a second 5 mm trocar in the right lower quadrant under direct visualization. A third trocar was placed suprapubically also under direct visualization. I then mobilized the sigmoid colon. I used a pair scissors to take down the connection to the left lateral sidewall. After this was completed the entire sigmoid colon was very mobile and  had good length. I identified a portion of the sigmoid that would reach to the previously marked ostomy site. I then bluntly dissected underneath the sigmoid colon with my retractor. I then placed a 16 French red rubber catheter into the abdomen. This was brought through the  dissected area and both ends were secured with my grasper. I then terminated into the ostomy site in the left upper quadrant.  A circular incision was made on the skin with the Bovie. This was carried down through the subcutaneous tissues using electrocautery. The fascia was identified and incised in a cruciate manner using the electric artery. The rectus was bluntly split and the peritoneum was entered without difficulty. The ostomy site was stretched approximately 2 fingerbreadth width. I then identified the red rubber catheter and brought this out through the abdominal wall. After this was completed a reinsufflated the abdomen and confirmed the proximal and distal limbs of the colostomy. I irrigated the left lower quadrant. There was good hemostasis. I then desufflated the abdomen and removed the trochar's.  The skin of the incisions was closed using a 4-0 Vicryl suture. Dermabond was used to close the skin. I then used 2-0 Vicryl to secure the red rubber into a loop. I then matured the ostomy in standard [place] fashion. Both limbs of the ostomy were patent. The distal limb was irrigated with some warm saline. An ostomy appliance was placed. The patient was then awakened from anesthesia and sent to the post anesthesia care in stable condition. All counts were correct per operating room staff.

## 2012-11-26 NOTE — Anesthesia Preprocedure Evaluation (Addendum)
Anesthesia Evaluation  Patient identified by MRN, date of birth, ID band Patient awake    Reviewed: Allergy & Precautions, H&P , NPO status , Patient's Chart, lab work & pertinent test results  Airway Mallampati: II TM Distance: >3 FB Neck ROM: Full    Dental  (+) Dental Advisory Given and Teeth Intact   Pulmonary pneumonia -,  breath sounds clear to auscultation        Cardiovascular hypertension, Pt. on medications +CHF negative cardio ROS  + dysrhythmias Atrial Fibrillation Rhythm:Regular Rate:Normal     Neuro/Psych PSYCHIATRIC DISORDERS Anxiety negative neurological ROS     GI/Hepatic Neg liver ROS, GERD-  ,(+) Hepatitis -, A  Endo/Other  negative endocrine ROS  Renal/GU negative Renal ROS     Musculoskeletal negative musculoskeletal ROS (+)   Abdominal   Peds  Hematology negative hematology ROS (+)   Anesthesia Other Findings   Reproductive/Obstetrics negative OB ROS                          Anesthesia Physical Anesthesia Plan  ASA: III  Anesthesia Plan: General   Post-op Pain Management:    Induction: Intravenous  Airway Management Planned: Oral ETT  Additional Equipment:   Intra-op Plan:   Post-operative Plan: Extubation in OR  Informed Consent: I have reviewed the patients History and Physical, chart, labs and discussed the procedure including the risks, benefits and alternatives for the proposed anesthesia with the patient or authorized representative who has indicated his/her understanding and acceptance.   Dental advisory given  Plan Discussed with: CRNA  Anesthesia Plan Comments:         Anesthesia Quick Evaluation

## 2012-11-26 NOTE — Preoperative (Signed)
Beta Blockers   Reason not to administer Beta Blockers:Not Applicable 

## 2012-11-26 NOTE — H&P (View-Only) (Signed)
Chief Complaint  Patient presents with  . New Evaluation    eval rectovaginal fistula/    HISTORY: [name] is a 79 y.o. female who presents to the office with a distal rectal mass.  This was found after a hospital admission in Jan where she had a distal obstruction.  She reports significant incontinence since this hospitalization.  Workup thus far has included a flexible sigmoidoscopy with biopsies and CT scan.  The CT shows a distal rectal mass with significant inflammation and presacral gas.  She denies any dysuria or gas or stool per vagina.  Her last UA was neg for infection, but her family states that she has had an increase in UTI's since hospitalization.  She does report increase in urinary incontinence as well.        Past Medical History  Diagnosis Date  . Hypertension   . Irregular heart beat   . GERD (gastroesophageal reflux disease)   . Anxiety   . Arthritis   . Congestive heart failure   . Hepatitis A   . Bowel obstruction 04/2012  . Chronic UTI       Past Surgical History  Procedure Laterality Date  . Cataract extraction Bilateral   . Dilatation & currettage/hysteroscopy with resectocope        Current Outpatient Prescriptions  Medication Sig Dispense Refill  . acetaminophen (TYLENOL) 500 MG tablet Take 500 mg by mouth every 6 (six) hours as needed for pain (pain).      . ATENOLOL PO Take 50 mg by mouth daily.       . ciprofloxacin (CIPRO) 250 MG tablet Take 1 tablet (250 mg total) by mouth 2 (two) times daily.  20 tablet  0  . folic acid (FOLVITE) 0.5 MG tablet Take 1 mg by mouth daily.      . metroNIDAZOLE (FLAGYL) 250 MG tablet Take 1 tablet (250 mg total) by mouth 3 (three) times daily.  30 tablet  0  . polyethylene glycol powder (MIRALAX) powder Take 17 g by mouth daily.      . potassium chloride SA (K-DUR,KLOR-CON) 20 MEQ tablet Take 20 mEq by mouth daily.       No current facility-administered medications for this visit.      Allergies  Allergen  Reactions  . Neosporin (Neomycin-Bacitracin Zn-Polymyx)   . Other     Depression medicines: nightmares, rash, vertigo,  Unsure of rx names, has tried 2.  . Sulfa Antibiotics   . Celexa (Citalopram Hydrobromide) Rash      Family History  Problem Relation Age of Onset  . Heart disease Mother   . Stroke Father   . Colon cancer Neg Hx       History   Social History  . Marital Status: Widowed    Spouse Name: N/A    Number of Children: 1  . Years of Education: N/A   Occupational History  . retired    Social History Main Topics  . Smoking status: Former Smoker -- 6 years    Types: Cigarettes    Quit date: 11/11/1938  . Smokeless tobacco: Never Used  . Alcohol Use: No  . Drug Use: No  . Sexually Active: None   Other Topics Concern  . None   Social History Narrative  . None       REVIEW OF SYSTEMS - PERTINENT POSITIVES ONLY: Review of Systems - General ROS: negative for - chills or fever Respiratory ROS: no cough, shortness of breath, or wheezing  Cardiovascular ROS: no chest pain or dyspnea on exertion Gastrointestinal ROS: positive for - change in bowel habits, stool incontinence and rectal pain negative for - nausea/vomiting Genito-Urinary ROS: positive for - incontinence and pelvic pain negative for - dysuria  EXAM: Filed Vitals:   11/10/12 0943  BP: 126/70  Pulse: 64  Temp: 98.8 F (37.1 C)  Resp: 15    Gen:  No acute distress.  Well nourished and well groomed.   Neurological: Alert and oriented to person, place, and time. Coordination normal.  Head: Normocephalic and atraumatic.  Eyes: Conjunctivae are normal. Pupils are equal, round, and reactive to light. No scleral icterus.  Neck: Normal range of motion. Neck supple. No tracheal deviation or thyromegaly present.  No cervical lymphadenopathy. Cardiovascular: Normal rate, regular rhythm, normal heart sounds and intact distal pulses.  Exam reveals no gallop and no friction rub.  No murmur  heard. Respiratory: Effort normal.  No respiratory distress. No chest wall tenderness. Breath sounds normal.  No wheezes, rales or rhonchi.  GI: Soft. Bowel sounds are normal. The abdomen is soft and nontender.  There is no rebound and no guarding.  Musculoskeletal: Normal range of motion. Extremities are nontender.  Skin: Skin is warm and dry. No rash noted. No diaphoresis. No erythema. No pallor. No clubbing, cyanosis, or edema.   Psychiatric: Normal mood and affect. Behavior is normal. Judgment and thought content normal.   Anorectal Exam: distal rectal stricture noted at ~5cm, hard stool accumulated proximally.   LABORATORY RESULTS: Available labs are reviewed  Lab Results  Component Value Date   WBC 9.6 09/21/2012   HGB 9.6* 09/21/2012   HCT 30.5* 09/21/2012   MCV 86.6 09/21/2012   PLT 272 09/21/2012   Lab Results  Component Value Date   CREATININE 0.66 09/21/2012     RADIOLOGY RESULTS:   Images and reports are reviewed. CT ABD IMPRESSION:  Extensive infectious/inflammatory changes in the pelvic floor  associated with circumferential wall thickening in the distal  sigmoid colon and rectum, at approximately the level of the  peritoneal reflection. This is associated with abnormal probable  granulation tissue in the presacral space with enhancement and a  small amount of gas tracking posteriorly from the rectum up into  the presacral soft tissues. There may be some minimal fluid in the  presacral space, but there is no distinct or drainable abscess at  this time. The inflammatory changes also obscure the fat plane  between the upper vagina and the rectum with gas visible in the  vaginal vault. This raises the question of a rectovaginal fistula.  Two subtle enhancing foci in the left liver, not visible on the  previous abdominal or chest CTs. Given the subtle appearance  today, the lack of visualization previously may be secondary to  differential bolus timing. If warranted,  abdominal MRI without and  with contrast could be used to further evaluate.    ASSESSMENT AND PLAN: [name] is a 79 y.o. F with a distal rectal stricture.  Biopsies have been inconclusive, but CT shows a definite inflammatory process in her pelvis with possible RVF.  It is unclear if this is a malignant process or inflammatory, but at her age, I believe they would be treated similarly.  I think she would benefit from a diverting colostomy.  I will wash out the rectum and possible get more biopsies at this time.  I think this will at least calm down the inflammation and will negate her incontinence.  I will have her see anesthesia preop for evaluation for anesthesia due to her advanced age.  I will also have the ostomy RN mark her prior to surgery.   The surgery and anatomy were described to the patient as well as the risks of surgery and the possible complications.  These include: Bleeding, infection and possible wound complications such as hernia, damage to adjacent structures, leak of surgical connections, which can lead to other surgeries, possible need for other procedures, such as abscess drains in radiology, possible prolonged hospital stay, possible constipation from narcotics, prolonged fatigue/weakness or appetite loss, possible early recurrence of cancer, possible complications of their medical problems such as heart disease or arrhythmias or lung problems, death (less than 1%). I believe the patient understands and wishes to proceed with the surgery.     Vanita Panda, MD Colon and Rectal Surgery / General Surgery [facility]      Visit Diagnoses: No diagnosis found.  Primary Care Physician: Florentina Jenny, MD

## 2012-11-27 LAB — BASIC METABOLIC PANEL
CO2: 25 mEq/L (ref 19–32)
Calcium: 8.5 mg/dL (ref 8.4–10.5)
Chloride: 101 mEq/L (ref 96–112)
Creatinine, Ser: 0.72 mg/dL (ref 0.50–1.10)
Glucose, Bld: 133 mg/dL — ABNORMAL HIGH (ref 70–99)

## 2012-11-27 LAB — CBC
MCH: 26.1 pg (ref 26.0–34.0)
MCHC: 30.7 g/dL (ref 30.0–36.0)
Platelets: 150 10*3/uL (ref 150–400)
RDW: 17.4 % — ABNORMAL HIGH (ref 11.5–15.5)

## 2012-11-27 LAB — URINE CULTURE

## 2012-11-27 NOTE — Evaluation (Addendum)
Physical Therapy Evaluation Patient Details Name: [name] MRN: [id number] DOB: July 12, 1920 Today's Date: 11/27/2012 Time: 4098-1191 PT Time Calculation (min): 32 min  PT Assessment / Plan / Recommendation History of Present Illness  [name]  has presented with the diagnosis of distal rectal stricture   for treatment, the :s/p colostomy 11/26/12.  Clinical Impression  Pt c/o abd pain. Instructed to place pillow over abd for coughing. Pt will benefit from PT while in acute care. Pt has caregivers but uncertain if family can provide increased care post op. Instructed that they speak to MD if pt may need rehab.    PT Assessment  Patient needs continued PT services    Follow Up Recommendations  Home health PT    Does the patient have the potential to tolerate intense rehabilitation      Barriers to Discharge        Equipment Recommendations  None recommended by PT    Recommendations for Other Services     Frequency Min 3X/week    Precautions / Restrictions Precautions Precautions: Fall Precaution Comments: colostomy,    Pertinent Vitals/Pain Not rated but stated abd pain. Had been medicated.     Mobility  Bed Mobility Bed Mobility: Supine to Sit Supine to Sit: HOB elevated;3: Mod assist Details for Bed Mobility Assistance: bed raised to max height to get to sitting to minimize pain Transfers Transfers: Sit to Stand;Stand to Sit;Stand Pivot Transfers Sit to Stand: From bed;3: Mod assist Stand to Sit: To chair/3-in-1;To bed Stand Pivot Transfers: 3: Mod assist Details for Transfer Assistance: pt able to take several steps to get to recliner. when pt stood , bloody drainage from rectum. RN and aware. Ambulation/Gait Ambulation/Gait Assistance: Not tested (comment)    Exercises     PT Diagnosis: Difficulty walking;Generalized weakness;Acute pain  PT Problem List: Decreased strength;Decreased activity tolerance;Decreased mobility;Decreased knowledge of use of  DME;Decreased safety awareness;Decreased knowledge of precautions;Pain PT Treatment Interventions: DME instruction;Gait training;Functional mobility training;Therapeutic activities;Patient/family education     PT Goals(Current goals can be found in the care plan section) Acute Rehab PT Goals PT Goal Formulation: With patient/family Time For Goal Achievement: 12/11/12 Potential to Achieve Goals: Good  Visit Information  Last PT Received On: 11/27/12 Assistance Needed: +2 History of Present Illness: [name]  has presented today for surgery, with the diagnosis of distal rectal stricture  The various methods of treatment have been discussed with the patient and family. After consideration of risks, benefits and other options for treatment, the patient has consented to  Procedure(s):       Prior Functioning  Home Living Family/patient expects to be discharged to:: Private residence Living Arrangements: Other relatives (granddaughter) Available Help at Discharge: Family Type of Home: House Home Access: Stairs to enter Home Layout: Two level;Able to live on main level with bedroom/bathroom Home Equipment: Wheelchair - manual Prior Function Level of Independence: Needs assistance Gait / Transfers Assistance Needed: always has someone amb. w/ pt,    Cognition  Cognition Arousal/Alertness: Awake/alert Behavior During Therapy: WFL for tasks assessed/performed Overall Cognitive Status: Within Functional Limits for tasks assessed    Extremity/Trunk Assessment Upper Extremity Assessment Upper Extremity Assessment: Generalized weakness Lower Extremity Assessment Lower Extremity Assessment: Generalized weakness Cervical / Trunk Assessment Cervical / Trunk Assessment: Normal (flexed forward,)   Balance    End of Session PT - End of Session Activity Tolerance: Patient limited by fatigue;Patient limited by pain Patient left: in chair;with call bell/phone within reach;with  family/visitor  present Nurse Communication: Mobility status  GP     Rada Hay 11/27/2012, 2:47 PM  Blanchard Kelch PT [phone]

## 2012-11-27 NOTE — Progress Notes (Signed)
1 Day Post-Op  Subjective: Complains of soreness. Has had some nausea as well but medicine has helped. She is tolerating some liquids now  Objective: Vital signs in last 24 hours: Temp:  [97.7 F (36.5 C)-98.7 F (37.1 C)] 98.7 F (37.1 C) (08/02 0513) Pulse Rate:  [61-74] 63 (08/02 0513) Resp:  [13-18] 18 (08/02 0513) BP: (120-181)/(46-72) 120/46 mmHg (08/02 0513) SpO2:  [95 %-100 %] 100 % (08/02 0513) Weight:  [129 lb 13.6 oz (58.9 kg)] 129 lb 13.6 oz (58.9 kg) (08/01 1624) Last BM Date: 11/26/12  Intake/Output from previous day: 08/01 0701 - 08/02 0700 In: 2622.5 [P.O.:240; I.V.:2382.5] Out: 2150 [Urine:2090; Stool:10; Blood:50] Intake/Output this shift:    GI: soft, appropriately tender. ostomy is pink but little output  Lab Results:   Recent Labs  11/24/12 1030 11/27/12 0608  WBC 5.3 14.0*  HGB 10.5* 8.8*  HCT 33.9* 28.7*  PLT 213 150   BMET  Recent Labs  11/24/12 1030 11/26/12 0930 11/27/12 0608  NA 140  --  134*  K 2.9* 3.2* 3.3*  CL 102  --  101  CO2 27  --  25  GLUCOSE 99  --  133*  BUN 13  --  11  CREATININE 0.80  --  0.72  CALCIUM 9.5  --  8.5   PT/INR No results found for this basename: LABPROT, INR,  in the last 72 hours ABG No results found for this basename: PHART, PCO2, PO2, HCO3,  in the last 72 hours  Studies/Results: No results found.  Anti-infectives: Anti-infectives   Start     Dose/Rate Route Frequency Ordered Stop   11/26/12 0839  ceFAZolin (ANCEF) IVPB 2 g/50 mL premix     2 g 100 mL/hr over 30 Minutes Intravenous On call to O.R. 11/26/12 7829 11/26/12 1243   11/26/12 0839  metroNIDAZOLE (FLAGYL) IVPB 500 mg     500 mg 100 mL/hr over 60 Minutes Intravenous On call to O.R. 11/26/12 0839 11/26/12 1253      Assessment/Plan: s/p Procedure(s): LAPAROSCOPIC COLOSTOMY PLACEMENT RIGID  SIGMOIDOSCOPY (N/A) Continue clears for now OOB today  LOS: 1 day    TOTH III,Tessy Pawelski S 11/27/2012

## 2012-11-28 LAB — CBC
MCH: 26.4 pg (ref 26.0–34.0)
Platelets: 147 10*3/uL — ABNORMAL LOW (ref 150–400)
RBC: 3.29 MIL/uL — ABNORMAL LOW (ref 3.87–5.11)
WBC: 5.7 10*3/uL (ref 4.0–10.5)

## 2012-11-28 LAB — BASIC METABOLIC PANEL
Calcium: 8.7 mg/dL (ref 8.4–10.5)
GFR calc Af Amer: 84 mL/min — ABNORMAL LOW (ref >90)
GFR calc non Af Amer: 72 mL/min — ABNORMAL LOW (ref >90)
Glucose, Bld: 108 mg/dL — ABNORMAL HIGH (ref 70–99)
Sodium: 135 mEq/L (ref 135–145)

## 2012-11-28 NOTE — Progress Notes (Signed)
2 Days Post-Op  Subjective: Complains of some sharp pains that come and go  Objective: Vital signs in last 24 hours: Temp:  [97.8 F (36.6 C)-98.7 F (37.1 C)] 98.7 F (37.1 C) (08/03 0647) Pulse Rate:  [55-64] 64 (08/03 0647) Resp:  [18] 18 (08/03 0647) BP: (144-166)/(47-55) 166/54 mmHg (08/03 0647) SpO2:  [100 %] 100 % (08/03 0647) Last BM Date: 11/28/12 (Colostomy)  Intake/Output from previous day: 08/02 0701 - 08/03 0700 In: 2370 [P.O.:1170; I.V.:1200] Out: 1175 [Urine:950; Stool:225] Intake/Output this shift:    GI: soft, appropriately tender. few bs  Lab Results:   Recent Labs  11/27/12 0608 11/28/12 0453  WBC 14.0* 5.7  HGB 8.8* 8.7*  HCT 28.7* 28.3*  PLT 150 147*   BMET  Recent Labs  11/27/12 0608 11/28/12 0453  NA 134* 135  K 3.3* 3.7  CL 101 102  CO2 25 26  GLUCOSE 133* 108*  BUN 11 9  CREATININE 0.72 0.75  CALCIUM 8.5 8.7   PT/INR No results found for this basename: LABPROT, INR,  in the last 72 hours ABG No results found for this basename: PHART, PCO2, PO2, HCO3,  in the last 72 hours  Studies/Results: No results found.  Anti-infectives: Anti-infectives   Start     Dose/Rate Route Frequency Ordered Stop   11/26/12 0839  ceFAZolin (ANCEF) IVPB 2 g/50 mL premix     2 g 100 mL/hr over 30 Minutes Intravenous On call to O.R. 11/26/12 9604 11/26/12 1243   11/26/12 0839  metroNIDAZOLE (FLAGYL) IVPB 500 mg     500 mg 100 mL/hr over 60 Minutes Intravenous On call to O.R. 11/26/12 0839 11/26/12 1253      Assessment/Plan: s/p Procedure(s): LAPAROSCOPIC COLOSTOMY PLACEMENT RIGID  SIGMOIDOSCOPY (N/A) Advance diet OOB  LOS: 2 days    TOTH III,PAUL S 11/28/2012

## 2012-11-28 NOTE — Progress Notes (Signed)
Pharmacy Brief Note - Alvimopan (Entereg)  The standing order set for alvimopan (Entereg) now includes an automatic order to discontinue the drug after the patient has had a bowel movement.  The change was approved by the Pharmacy & Therapeutics Committee and the Medical Executive Committee.    This patient has had a bowel movement documented by nursing.  Therefore, alvimopan has been discontinued.  If there are questions, please contact the pharmacy at [phone].  Thank you- Otho Bellows PharmD Pager [phone] 11/28/2012, 11:02 AM

## 2012-11-29 ENCOUNTER — Encounter (HOSPITAL_COMMUNITY): Payer: Self-pay | Admitting: General Surgery

## 2012-11-29 DIAGNOSIS — K624 Stenosis of anus and rectum: Secondary | ICD-10-CM

## 2012-11-29 MED ORDER — ALVIMOPAN 12 MG PO CAPS: 12.0000 mg | ORAL_CAPSULE | Freq: Two times a day (BID) | ORAL | Status: DC

## 2012-11-29 MED ORDER — ALVIMOPAN 12 MG PO CAPS
12.0000 mg | ORAL_CAPSULE | Freq: Two times a day (BID) | ORAL | Status: DC
  Administered 2012-11-29 – 2012-12-01 (×5): 12 mg via ORAL
  Filled 2012-11-29 (×6): qty 1

## 2012-11-29 NOTE — Progress Notes (Signed)
3 Days Post-Op lap colostomy placement Subjective: Complains of some sharp pains that come and go around her ostomy. No nausea  Objective: Vital signs in last 24 hours: Temp:  [98 F (36.7 C)-98.5 F (36.9 C)] 98.5 F (36.9 C) (08/04 0420) Pulse Rate:  [58-63] 60 (08/04 0420) Resp:  [18-19] 18 (08/04 0420) BP: (126-173)/(40-74) 155/47 mmHg (08/04 0420) SpO2:  [97 %-98 %] 98 % (08/04 0420) Last BM Date: 11/25/12  Intake/Output from previous day: 08/03 0701 - 08/04 0700 In: 1736.7 [P.O.:540; I.V.:1196.7] Out: 1790 [Urine:1650; Stool:140] Intake/Output this shift: Total I/O In: -  Out: 200 [Urine:200] NAD GI: soft, appropriately tender. few bs, distended Ostomy: beefy red, edematous, bloody output in bag  Lab Results:   Recent Labs  11/27/12 0608 11/28/12 0453  WBC 14.0* 5.7  HGB 8.8* 8.7*  HCT 28.7* 28.3*  PLT 150 147*   BMET  Recent Labs  11/27/12 0608 11/28/12 0453  NA 134* 135  K 3.3* 3.7  CL 101 102  CO2 25 26  GLUCOSE 133* 108*  BUN 11 9  CREATININE 0.72 0.75  CALCIUM 8.5 8.7   PT/INR No results found for this basename: LABPROT, INR,  in the last 72 hours ABG No results found for this basename: PHART, PCO2, PO2, HCO3,  in the last 72 hours  Studies/Results: No results found.  Anti-infectives: Anti-infectives   Start     Dose/Rate Route Frequency Ordered Stop   11/26/12 0839  ceFAZolin (ANCEF) IVPB 2 g/50 mL premix     2 g 100 mL/hr over 30 Minutes Intravenous On call to O.R. 11/26/12 1610 11/26/12 1243   11/26/12 0839  metroNIDAZOLE (FLAGYL) IVPB 500 mg     500 mg 100 mL/hr over 60 Minutes Intravenous On call to O.R. 11/26/12 0839 11/26/12 1253      Assessment/Plan: s/p Procedure(s): LAPAROSCOPIC COLOSTOMY PLACEMENT RIGID  SIGMOIDOSCOPY (N/A) Cont diet as tolerated KVO IV Ambulate Ostomy education Will await return of ostomy function prior to d/c  LOS: 3 days    Lacey Brickey C. 11/29/2012

## 2012-11-29 NOTE — Progress Notes (Signed)
Physical Therapy Treatment Patient Details Name: [name] MRN: [id number] DOB: 21-Mar-1921 Today's Date: 11/29/2012 Time: 4098-1191 PT Time Calculation (min): 29 min  PT Assessment / Plan / Recommendation  History of Present Illness [name]  has presented today for surgery, with the diagnosis of distal rectal stricture  . S/P colostomy  PT Comments   Pt much more animated, talkative, pain much less during mobility. Pt ambulated x 70 '. Family confirms DC plan to home. Pt profoundly incontinent of urine with every attempts to stand.  Follow Up Recommendations  Home health PT     Does the patient have the potential to tolerate intense rehabilitation     Barriers to Discharge        Equipment Recommendations  None recommended by PT    Recommendations for Other Services    Frequency Min 3X/week   Progress towards PT Goals Progress towards PT goals: Progressing toward goals  Plan Current plan remains appropriate    Precautions / Restrictions Precautions Precautions: Fall Precaution Comments: colostomy, incontinent of urine profoundly Restrictions Weight Bearing Restrictions: No   Pertinent Vitals/Pain States abd is sore.    Mobility  Bed Mobility Bed Mobility: Rolling Right;Right Sidelying to Sit Rolling Right: 4: Min assist Transfers Sit to Stand: From bed;4: Min assist;From chair/3-in-1;With upper extremity assist;With armrests Stand to Sit: To chair/3-in-1;With armrests;With upper extremity assist Stand Pivot Transfers: 3: Mod assist Details for Transfer Assistance: pt incontinent of urine upon standing every time. Ambulation/Gait Ambulation/Gait Assistance: 4: Min assist Ambulation Distance (Feet): 70 Feet Assistive device: Rolling walker Ambulation/Gait Assistance Details: pt was steady while ambulating with RW. cues to look up . Gait Pattern: Step-through pattern    Exercises     PT Diagnosis:    PT Problem List:   PT Treatment Interventions:      PT Goals (current goals can now be found in the care plan section)    Visit Information  Last PT Received On: 11/29/12 Assistance Needed: +1 History of Present Illness: [name]  has presented today for surgery, with the diagnosis of distal rectal stricture  . After consideration of risks, benefits and other options for treatment, the    Subjective Data      Cognition  Cognition Arousal/Alertness: Awake/alert Behavior During Therapy: WFL for tasks assessed/performed Overall Cognitive Status: Within Functional Limits for tasks assessed    Balance  Balance Balance Assessed: Yes Static Standing Balance Static Standing - Balance Support: Bilateral upper extremity supported Static Standing - Level of Assistance: 4: Min assist Static Standing - Comment/# of Minutes: standing at RW for periwash.  End of Session PT - End of Session Activity Tolerance: Patient tolerated treatment well Patient left: in chair;with call bell/phone within reach;with family/visitor present Nurse Communication: Mobility status   GP     Rada Hay 11/29/2012, 9:57 AM Blanchard Kelch PT [phone]

## 2012-11-29 NOTE — Consult Note (Addendum)
WOC ostomy consult  Stoma type/location:  Pt received colostomy to left lower quad during surgery on 8/1 Stomal assessment/size: Stoma red and viable, above skin level, 1 3/4 inches, red rubber rod intact to stoma. Peristomal assessment: Skin intact to surrounding area. Output 50cc reddish brown stool, no stool or flatus at this time. Ostomy pouching: 1pc Education provided: Son at bedside for teaching session.  Demonstrated pouch application.  Red rubber rod will create difficult pouching situation until removal.  One piece pouch applied.  Pt and son able to open to empty and close with velcro. Discussed pouching supplies and routines.  Pt has arthritis and does not think she should use a two piece pouching system.  Supplies ordered to room for staff nurses.  Educational book given to family member.  They state her granddaughter is a CNA and will assist with pouching activities. WOC team can follow for further teaching sessions. Cammie Mcgee MSN, RN, CWOCN, [place], CNS [phone]

## 2012-11-29 NOTE — Progress Notes (Signed)
MD -- need telemetry order if pt is to remain on telemetry. Thank  You.

## 2012-11-29 NOTE — Evaluation (Signed)
Occupational Therapy Evaluation Patient Details Name: [name] MRN: [id number] DOB: 10-27-1920 Today's Date: 11/29/2012 Time: 4098-1191 OT Time Calculation (min): 34 min  OT Assessment / Plan / Recommendation History of present illness [name]  Was admitted  for surgery, with the diagnosis of distal rectal stricture  She is s/p colostomy   Clinical Impression   Pt was admitted for rectal stricture and is s/p colostomy.  She will benefit from skilled OT to increase safety and independence with adls.  Goals are supervision to min A level for toileting and grooming.  Pt had assist with adls prior to admission.      OT Assessment  Patient needs continued OT Services    Follow Up Recommendations  Home health OT;Supervision/Assistance - 24 hour    Barriers to Discharge      Equipment Recommendations  None recommended by OT    Recommendations for Other Services    Frequency  Min 2X/week    Precautions / Restrictions Precautions Precautions: Fall Precaution Comments: colostomy, incontinent of urine profoundly Restrictions Weight Bearing Restrictions: No   Pertinent Vitals/Pain Had intermittent pain in L side.  Repositioned in chair    ADL  Grooming: Set up Where Assessed - Grooming: Unsupported sitting Upper Body Bathing: Supervision/safety Where Assessed - Upper Body Bathing: Unsupported sitting Lower Body Bathing: Maximal assistance Where Assessed - Lower Body Bathing: Supported sit to stand Upper Body Dressing: Minimal assistance Where Assessed - Upper Body Dressing: Unsupported sitting Lower Body Dressing: +1 Total assistance Where Assessed - Lower Body Dressing: Supported sit to stand Toilet Transfer: Minimal assistance Toilet Transfer Method: Surveyor, minerals: Comfort height toilet Toileting - Clothing Manipulation and Hygiene: +1 Total assistance Where Assessed - Engineer, mining and Hygiene: Sit to stand from 3-in-1  or toilet Equipment Used: Rolling walker Transfers/Ambulation Related to ADLs: Pt ambulated  out to hall with min A ADL Comments: Pt had several episodes of urine incontinence when moving/trying to clean her up from sitting on commode.  She normally wears depends type of garments.  Used mesh underwear and pads.      OT Diagnosis: Generalized weakness  OT Problem List: Decreased strength;Decreased activity tolerance;Impaired balance (sitting and/or standing);Pain OT Treatment Interventions: Self-care/ADL training;DME and/or AE instruction;Patient/family education;Therapeutic activities;Balance training   OT Goals(Current goals can be found in the care plan section) Acute Rehab OT Goals Patient Stated Goal: none stated but agreeable to therapy OT Goal Formulation: With patient Time For Goal Achievement: 12/13/12 Potential to Achieve Goals: Good ADL Goals Pt Will Perform Grooming: with supervision;standing Pt Will Transfer to Toilet: with min guard assist;ambulating;bedside commode Pt Will Perform Toileting - Clothing Manipulation and hygiene: with min assist;sit to/from stand  Visit Information  Last OT Received On: 11/29/12 Assistance Needed: +1 PT/OT Co-Evaluation/Treatment: Yes History of Present Illness: [name]  has presented today for surgery, with the diagnosis of distal rectal stricture  . After consideration of risks, benefits and other options for treatment, the       Prior Functioning     Home Living Family/patient expects to be discharged to:: Private residence Living Arrangements: Other relatives Home Equipment: Wheelchair - manual Additional Comments: will have 24/7 Prior Function Level of Independence: Needs assistance Gait / Transfers Assistance Needed: always has someone amb. w/ pt, Communication Communication: No difficulties Dominant Hand: Right         Vision/Perception     Cognition  Cognition Arousal/Alertness: Awake/alert Behavior  During Therapy: WFL for tasks assessed/performed  Overall Cognitive Status: Within Functional Limits for tasks assessed    Extremity/Trunk Assessment Upper Extremity Assessment Upper Extremity Assessment: Generalized weakness Cervical / Trunk Assessment Cervical / Trunk Assessment: Normal (flexed trunk; s/p colostomy)     Mobility Bed Mobility Bed Mobility: Rolling Right;Right Sidelying to Sit Rolling Right: 4: Min assist Supine to Sit: 4: Min assist Details for Bed Mobility Assistance: assist for trunk Transfers Sit to Stand: From bed;4: Min assist;From chair/3-in-1;With upper extremity assist;With armrests Stand to Sit: To chair/3-in-1;With armrests;With upper extremity assist Details for Transfer Assistance: pt incontinent of urine upon standing every time.     Exercise     Balance Balance Balance Assessed: Yes Static Standing Balance Static Standing - Balance Support: Bilateral upper extremity supported Static Standing - Level of Assistance: 4: Min assist Static Standing - Comment/# of Minutes: standing at RW for periwash.   End of Session OT - End of Session Activity Tolerance: Patient tolerated treatment well Patient left: in chair;with call bell/phone within reach;with family/visitor present  GO     Adewale Pucillo 11/29/2012, 10:18 AM [name], OTR/L [phone] 11/29/2012

## 2012-11-29 NOTE — Progress Notes (Signed)
When reading progress notes, pharmacy had d/c'd the Entereg due to an incorrect documentation of pt having a BM on 11/28/12 in under assessment tab, but it was old bloody drainage from drain in colostomy, not stool. Pharmacist was notified at 03:00am, action taken to continue Entereg 14days post-op or until the pt has a BM via her colostomy which ever comes first.

## 2012-11-30 NOTE — Progress Notes (Signed)
4 Days Post-Op lap colostomy placement Subjective: Complains of some sharp pains that come and go around her ostomy and lower abd.  Controlled with narcotics.  No nausea  Objective: Vital signs in last 24 hours: Temp:  [98.1 F (36.7 C)-98.4 F (36.9 C)] 98.4 F (36.9 C) (08/05 0447) Pulse Rate:  [58-61] 58 (08/05 0447) Resp:  [18-19] 18 (08/05 0447) BP: (151-172)/(55-63) 151/63 mmHg (08/05 0447) SpO2:  [96 %-100 %] 96 % (08/05 0447) Last BM Date: 11/29/12  Intake/Output from previous day: 08/04 0701 - 08/05 0700 In: 990 [P.O.:480; I.V.:510] Out: 1855 [Urine:1800; Stool:55] Intake/Output this shift:   NAD GI: soft, appropriately tender. few bs, distended Ostomy: beefy red, edematous, bloody output and air in bag  Lab Results:   Recent Labs  11/28/12 0453  WBC 5.7  HGB 8.7*  HCT 28.3*  PLT 147*   BMET  Recent Labs  11/28/12 0453  NA 135  K 3.7  CL 102  CO2 26  GLUCOSE 108*  BUN 9  CREATININE 0.75  CALCIUM 8.7   PT/INR No results found for this basename: LABPROT, INR,  in the last 72 hours ABG No results found for this basename: PHART, PCO2, PO2, HCO3,  in the last 72 hours  Studies/Results: No results found.  Anti-infectives: Anti-infectives   Start     Dose/Rate Route Frequency Ordered Stop   11/26/12 0839  ceFAZolin (ANCEF) IVPB 2 g/50 mL premix     2 g 100 mL/hr over 30 Minutes Intravenous On call to O.R. 11/26/12 4098 11/26/12 1243   11/26/12 0839  metroNIDAZOLE (FLAGYL) IVPB 500 mg     500 mg 100 mL/hr over 60 Minutes Intravenous On call to O.R. 11/26/12 0839 11/26/12 1253      Assessment/Plan: s/p Procedure(s): LAPAROSCOPIC COLOSTOMY PLACEMENT RIGID  SIGMOIDOSCOPY (N/A) Cont diet as tolerated Ambulate Ostomy education No need for tele at this time Hopefully, d/c tom  LOS: 4 days    Lacey Adami C. 11/30/2012

## 2012-11-30 NOTE — Progress Notes (Signed)
Occupational Therapy Treatment Patient Details Name: [name] MRN: [id number] DOB: 1920/06/20 Today's Date: 11/30/2012 Time: 3875-6433 OT Time Calculation (min): 19 min  OT Assessment / Plan / Recommendation  History of present illness pt is s/p colostomy for rectal stricture   OT comments  Pt is making good gains in OT.  She was continent today and used commode twice during this session.    Follow Up Recommendations  Home health OT;Supervision/Assistance - 24 hour    Barriers to Discharge       Equipment Recommendations  None recommended by OT    Recommendations for Other Services    Frequency Min 2X/week   Progress towards OT Goals Progress towards OT goals: Progressing toward goals  Plan      Precautions / Restrictions Precautions Precautions: Fall Precaution Comments: colostomy, incontinent of urine profoundly Restrictions Weight Bearing Restrictions: No   Pertinent Vitals/Pain No pain    ADL  Grooming: Wash/dry hands;Supervision/safety Where Assessed - Grooming: Supported standing Toilet Transfer: Min Pension scheme manager Method: Sit to stand;Stand Wellsite geologist: Comfort height toilet;Bedside commode;Grab bars Toileting - Clothing Manipulation and Hygiene: Minimal assistance Where Assessed - Toileting Clothing Manipulation and Hygiene: Sit on 3-in-1 or toilet;Lean right and/or left Equipment Used: Rolling walker Transfers/Ambulation Related to ADLs: sat on 3:1 then ambulated to bathroom with min guard to wash hands.  Pt had to use commode again, so sat on comfort height commode ADL Comments: no incontinence of urine today    OT Diagnosis:    OT Problem List:   OT Treatment Interventions:     OT Goals(current goals can now be found in the care plan section)    Visit Information  Last OT Received On: 11/30/12 Assistance Needed: +1 History of Present Illness: pt is s/p colostomy for rectal stricture    Subjective Data      Prior  Functioning       Cognition  Cognition Arousal/Alertness: Awake/alert Behavior During Therapy: WFL for tasks assessed/performed Overall Cognitive Status: Within Functional Limits for tasks assessed    Mobility  Bed Mobility Rolling Right: 4: Min guard;With rail Transfers Sit to Stand: 4: Min guard;From bed;From chair/3-in-1;With upper extremity assist Details for Transfer Assistance: min A to get up from comfort height commode    Exercises      Balance     End of Session OT - End of Session Activity Tolerance: Patient tolerated treatment well Patient left: in bed;with call bell/phone within reach;with bed alarm set  GO     Bence Trapp 11/30/2012, 4:30 PM [name], OTR/L [phone] 11/30/2012

## 2012-12-01 MED ORDER — POLYETHYLENE GLYCOL 3350 17 GM/SCOOP PO POWD: 8.5000 g | Freq: Two times a day (BID) | ORAL | Status: DC

## 2012-12-01 MED ORDER — POLYETHYLENE GLYCOL 3350 17 G PO PACK
17.0000 g | PACK | Freq: Two times a day (BID) | ORAL | Status: DC
  Administered 2012-12-01: 17 g via ORAL
  Filled 2012-12-01 (×2): qty 1

## 2012-12-01 MED ORDER — HYDROCODONE-ACETAMINOPHEN 5-325 MG PO TABS
1.0000 | ORAL_TABLET | ORAL | Status: DC | PRN
Start: 2012-12-01 — End: 2012-12-18

## 2012-12-01 NOTE — Discharge Summary (Signed)
Physician Discharge Summary  Patient ID: [name] MRN: [id number] DOB/AGE: [birth year] 77 y.o.  Admit date: 11/26/2012 Discharge date: 12/01/2012  Admission Diagnoses: Rectal stricture  Discharge Diagnoses:  Principal Problem:   Rectal stricture   Discharged Condition: good  Hospital Course: The patient was admitted after a loop colostomy placement.  Her diet was advanced slowly.  She began to ambulate.  Her ostomy is patent and functioning.  Consults: EST  Significant Diagnostic Studies: labs: cbc, chemistry Urine culture: neg  Treatments: IV hydration and analgesia: acetaminophen w/ codeine  Discharge Exam: Blood pressure 169/50, pulse 58, temperature 98.2 F (36.8 C), temperature source Oral, resp. rate 18, height 5\' 4"  (1.626 m), weight 129 lb 13.6 oz (58.9 kg), SpO2 94.00%. General appearance: alert and cooperative GI: soft, non-tender; bowel sounds normal; no masses,  no organomegaly Incision/Wound: clean, dry, intact  Disposition: 01-Home or Self Care   Future Appointments Provider Department Dept Phone   12/29/2012 3:35 PM Romie Levee, MD [facility], [place] [phone]       Medication List    STOP taking these medications       potassium chloride SA 20 MEQ tablet  Commonly known as:  K-DUR,KLOR-CON      TAKE these medications       acetaminophen 500 MG tablet  Commonly known as:  TYLENOL  Take 500 mg by mouth every 6 (six) hours as needed for pain (pain).     atenolol 50 MG tablet  Commonly known as:  TENORMIN  Take 50 mg by mouth daily after breakfast.     docusate sodium 100 MG capsule  Commonly known as:  COLACE  Take 100 mg by mouth every morning.     folic acid 1 MG tablet  Commonly known as:  FOLVITE  Take 1 mg by mouth daily.     HYDROcodone-acetaminophen 5-325 MG per tablet  Commonly known as:  NORCO/VICODIN  Take 1-2 tablets by mouth every 4 (four) hours as needed.     OPTIVE 0.5-0.9 % Soln  Generic drug:   Carboxymethylcellul-Glycerin  Apply 1 drop to eye daily as needed (for dry eyes).     potassium gluconate 595 MG Tabs tablet  Take 595 mg by mouth daily.           Follow-up Information   Follow up with Vanita Panda., MD. Schedule an appointment as soon as possible for a visit in 2 weeks.   Contact information:   [address] [place] [address] [phone]       Signed: Vanita Panda 12/01/2012, 12:14 PM

## 2012-12-01 NOTE — Consult Note (Signed)
WOC ostomy consult  Stoma type/location: LMQ Colostomy Stomal assessment/size: 2 inch round, budded moist and functioning stoma Peristomal assessment: intact, clear Treatment options for stomal/peristomal skin: None.  i will add a skin barrier ring today. Output soft brown stool Ostomy pouching: 1pc. Cut-to-fit pouching system with a skin barrier ring added for extra wear time and skin protection.  Education provided: Son and patient observed pouch removal, sizing of stoma and application of new pouch with addition of skin barrier ring.  Both are asking appropriate questions and seem satisfied with responses.  Patient provided with two week's worth of supplies and another two week's worth will be sent to her home via [employer] post-acute care sampling program per their request. Son is provided with my contact information in the event they need additional support post discharge.  Note:  Red robinson catheter rod is removed today without incident.  Patient has teaching booklet provided earlier this week by my partner.  Patient is ready for discharge to home in the care of competent caregivers (granddaughter who is a Public relations account executive and son who lives next door) with follow up by her physician.  Son will likely purchase ostomy supplies from a neighborhood supplier [employer]). Thanks, Ladona Mow, MSN, RN, [facility], CWOCN [phone])

## 2012-12-02 ENCOUNTER — Telehealth (INDEPENDENT_AMBULATORY_CARE_PROVIDER_SITE_OTHER): Payer: Self-pay

## 2012-12-02 NOTE — Telephone Encounter (Signed)
Message copied by Ivory Broad on Thu Dec 02, 2012  4:39 PM ------      Message from: Isaias Sakai K      Created: Thu Dec 02, 2012 12:17 PM      Regarding: Dr Maisie Fus      Contact: [phone]       Patient need 1st po for sx on 11/26/12. Would like late afternoon. Cell# [phone]. ------

## 2012-12-02 NOTE — Telephone Encounter (Signed)
I called and spoke to the son and scheduled an appointment for next week 12/10/2012 at 1pm.

## 2012-12-09 ENCOUNTER — Other Ambulatory Visit (INDEPENDENT_AMBULATORY_CARE_PROVIDER_SITE_OTHER): Payer: Self-pay | Admitting: General Surgery

## 2012-12-09 ENCOUNTER — Telehealth (INDEPENDENT_AMBULATORY_CARE_PROVIDER_SITE_OTHER): Payer: Self-pay | Admitting: General Surgery

## 2012-12-09 MED ORDER — AMOXICILLIN-POT CLAVULANATE 875-125 MG PO TABS: 1.0000 | ORAL_TABLET | Freq: Two times a day (BID) | ORAL | Status: DC

## 2012-12-09 NOTE — Telephone Encounter (Signed)
Pt's son called to report mother is overall doing fairly well, but has developed an area surrounding her stoma that is red, hard and sore to the touch.  She is running a low-grade fever.  Paged and updated Dr. Maisie Fus; pt has appt tomorrow for her first postop.  Dr. Maisie Fus stated to be sure they bring a complete set of ostomy supplies, as she will do a thorough exam and decide at that time if any further treatment is indicated.  Called son and passed this information to him as well.  He understands.

## 2012-12-09 NOTE — Telephone Encounter (Signed)
Her daughter called and stated that [name] had a temperature of 100.1. She has a firm area near her colostomy it appears to be a little swollen now. She had called earlier this morning. She is due to see Dr. Maisie Fus for her postoperative visit tomorrow. It sounds like she may be be developing infection. I called her in some Augmentin 875 mg by mouth every 12 hours. I told her she could try to put an ice pack over the area for comfort and to keep her appointment with Dr. Maisie Fus tomorrow.

## 2012-12-10 ENCOUNTER — Encounter (INDEPENDENT_AMBULATORY_CARE_PROVIDER_SITE_OTHER): Payer: Self-pay | Admitting: General Surgery

## 2012-12-10 ENCOUNTER — Ambulatory Visit (INDEPENDENT_AMBULATORY_CARE_PROVIDER_SITE_OTHER): Payer: Medicare Other | Admitting: General Surgery

## 2012-12-10 VITALS — BP 120/62 | HR 66 | Temp 98.2°F | Resp 12 | Wt 125.5 lb

## 2012-12-10 DIAGNOSIS — L02219 Cutaneous abscess of trunk, unspecified: Secondary | ICD-10-CM

## 2012-12-10 MED ORDER — FLUCONAZOLE 100 MG PO TABS: 100.0000 mg | ORAL_TABLET | Freq: Every day | ORAL | Status: DC

## 2012-12-10 NOTE — Patient Instructions (Signed)
Remove packing with next ostomy bag change.  Call office if not improved by [facility]

## 2012-12-10 NOTE — Progress Notes (Signed)
[name] is a 79 y.o. female who is status post a loop colosotomy placement on 8/1.  Her ostomy is functioning.  She is mobilizing well.  She has been having low grade fevers and worsening pain around her ostomy.  She was started on Augmentin last night by the on call MD  Objective: Filed Vitals:   12/10/12 1257  BP: 120/62  Pulse: 66  Temp: 98.2 F (36.8 C)  Resp: 12    General appearance: alert and appears stated age GI: soft, non-tender; bowel sounds normal; no masses,  no organomegaly Ostomy: peristomal fluid collection, separation of inferior edge of ostomy border  Incision: healing well  Purulence drained from around ostomy.  Sterile culture obtained.  Cavity packed with iodoform gauze and ostomy appliance replaced Assessment: s/p  Patient Active Problem List   Diagnosis Date Noted  . Rectal stricture 11/29/2012  . Rectal mass 11/01/2012  . Diarrhea 05/13/2012  . HAP (hospital-acquired pneumonia) 05/10/2012  . Hypokalemia 05/05/2012  . Leukocytosis 05/02/2012  . SBO (small bowel obstruction) 05/01/2012  . Hyponatremia 05/01/2012  . HTN (hypertension) 05/01/2012  . Traumatic hematoma of foot 05/01/2012    Plan: Peristomal infection with cellulitis.  Remove packing with next ostomy change.  Will continue augmentin and add diflucan.  RTO in 1 week.      Vanita Panda, MD [facility], [place] [phone]   12/10/2012 1:44 PM

## 2012-12-12 ENCOUNTER — Other Ambulatory Visit (INDEPENDENT_AMBULATORY_CARE_PROVIDER_SITE_OTHER): Payer: Self-pay | Admitting: General Surgery

## 2012-12-12 ENCOUNTER — Telehealth (INDEPENDENT_AMBULATORY_CARE_PROVIDER_SITE_OTHER): Payer: Self-pay | Admitting: Surgery

## 2012-12-12 DIAGNOSIS — L02219 Cutaneous abscess of trunk, unspecified: Secondary | ICD-10-CM

## 2012-12-12 LAB — WOUND CULTURE: Gram Stain: NONE SEEN

## 2012-12-12 MED ORDER — CLINDAMYCIN HCL 150 MG PO CAPS: 300.0000 mg | ORAL_CAPSULE | Freq: Three times a day (TID) | ORAL | Status: AC

## 2012-12-12 NOTE — Telephone Encounter (Signed)
Patient seen by Dr. Maisie Fus on Friday with peristomal infection.  Still having pain on Sunday with pain around to flank.  No fever.  On Augmentin and Diflucan.  Please call Monday AM.  If no better, bring to office for Dr. Maisie Fus or Urgent Office MD to evaluate.  I told son to bring to ER if symptoms worsen today or if she develops a fever.  tmg  Velora Heckler, MD, [facility] Office: [phone]

## 2012-12-13 ENCOUNTER — Observation Stay (HOSPITAL_COMMUNITY): Payer: Medicare Other

## 2012-12-13 ENCOUNTER — Inpatient Hospital Stay (HOSPITAL_COMMUNITY)
Admission: AD | Admit: 2012-12-13 | Discharge: 2012-12-17 | DRG: 394 | Disposition: A | Discharge status: Home Health | Payer: Medicare Other | Source: Ambulatory Visit | Attending: General Surgery | Admitting: General Surgery

## 2012-12-13 ENCOUNTER — Encounter (INDEPENDENT_AMBULATORY_CARE_PROVIDER_SITE_OTHER): Payer: Medicare Other | Admitting: General Surgery

## 2012-12-13 ENCOUNTER — Other Ambulatory Visit (INDEPENDENT_AMBULATORY_CARE_PROVIDER_SITE_OTHER): Payer: Self-pay | Admitting: General Surgery

## 2012-12-13 ENCOUNTER — Encounter (HOSPITAL_COMMUNITY): Payer: Self-pay | Admitting: *Deleted

## 2012-12-13 ENCOUNTER — Telehealth (INDEPENDENT_AMBULATORY_CARE_PROVIDER_SITE_OTHER): Payer: Self-pay | Admitting: General Surgery

## 2012-12-13 DIAGNOSIS — I509 Heart failure, unspecified: Secondary | ICD-10-CM | POA: Diagnosis present

## 2012-12-13 DIAGNOSIS — I10 Essential (primary) hypertension: Secondary | ICD-10-CM | POA: Diagnosis present

## 2012-12-13 DIAGNOSIS — F411 Generalized anxiety disorder: Secondary | ICD-10-CM | POA: Diagnosis present

## 2012-12-13 DIAGNOSIS — K9402 Colostomy infection: Principal | ICD-10-CM | POA: Diagnosis present

## 2012-12-13 DIAGNOSIS — I4891 Unspecified atrial fibrillation: Secondary | ICD-10-CM | POA: Diagnosis present

## 2012-12-13 DIAGNOSIS — L02219 Cutaneous abscess of trunk, unspecified: Secondary | ICD-10-CM | POA: Diagnosis present

## 2012-12-13 DIAGNOSIS — IMO0002 Reserved for concepts with insufficient information to code with codable children: Principal | ICD-10-CM | POA: Diagnosis present

## 2012-12-13 DIAGNOSIS — K219 Gastro-esophageal reflux disease without esophagitis: Secondary | ICD-10-CM | POA: Diagnosis present

## 2012-12-13 DIAGNOSIS — A4902 Methicillin resistant Staphylococcus aureus infection, unspecified site: Secondary | ICD-10-CM | POA: Diagnosis present

## 2012-12-13 DIAGNOSIS — M129 Arthropathy, unspecified: Secondary | ICD-10-CM | POA: Diagnosis present

## 2012-12-13 DIAGNOSIS — Z87891 Personal history of nicotine dependence: Secondary | ICD-10-CM

## 2012-12-13 DIAGNOSIS — L02211 Cutaneous abscess of abdominal wall: Secondary | ICD-10-CM

## 2012-12-13 LAB — BASIC METABOLIC PANEL WITH GFR
BUN: 13 mg/dL (ref 6–23)
CO2: 23 meq/L (ref 19–32)
Calcium: 9.2 mg/dL (ref 8.4–10.5)
Chloride: 100 meq/L (ref 96–112)
Creatinine, Ser: 0.61 mg/dL (ref 0.50–1.10)
GFR calc Af Amer: 90 mL/min — ABNORMAL LOW
GFR calc non Af Amer: 77 mL/min — ABNORMAL LOW
Glucose, Bld: 116 mg/dL — ABNORMAL HIGH (ref 70–99)
Potassium: 3 meq/L — ABNORMAL LOW (ref 3.5–5.1)
Sodium: 136 meq/L (ref 135–145)

## 2012-12-13 LAB — CBC
HCT: 30.8 % — ABNORMAL LOW (ref 36.0–46.0)
Hemoglobin: 9.5 g/dL — ABNORMAL LOW (ref 12.0–15.0)
MCH: 26.2 pg (ref 26.0–34.0)
MCHC: 30.8 g/dL (ref 30.0–36.0)
MCV: 85.1 fL (ref 78.0–100.0)
Platelets: 290 K/uL (ref 150–400)
RBC: 3.62 MIL/uL — ABNORMAL LOW (ref 3.87–5.11)
RDW: 16.4 % — ABNORMAL HIGH (ref 11.5–15.5)
WBC: 6.4 K/uL (ref 4.0–10.5)

## 2012-12-13 MED ORDER — OXYCODONE HCL 5 MG PO TABS
5.0000 mg | ORAL_TABLET | ORAL | Status: DC | PRN
  Administered 2012-12-13 – 2012-12-16 (×5): 5 mg via ORAL
  Filled 2012-12-13 (×5): qty 1

## 2012-12-13 MED ORDER — IOHEXOL 300 MG/ML  SOLN
100.0000 mL | Freq: Once | INTRAMUSCULAR | Status: AC | PRN
  Administered 2012-12-13: 80 mL via INTRAVENOUS

## 2012-12-13 MED ORDER — ENOXAPARIN SODIUM 40 MG/0.4ML ~~LOC~~ SOLN
40.0000 mg | SUBCUTANEOUS | Status: DC
  Administered 2012-12-13 – 2012-12-16 (×4): 40 mg via SUBCUTANEOUS
  Filled 2012-12-13 (×5): qty 0.4

## 2012-12-13 MED ORDER — VANCOMYCIN HCL 10 G IV SOLR
1250.0000 mg | Freq: Once | INTRAVENOUS | Status: AC
  Administered 2012-12-13: 1250 mg via INTRAVENOUS
  Filled 2012-12-13: qty 1250

## 2012-12-13 MED ORDER — CLINDAMYCIN PHOSPHATE 600 MG/50ML IV SOLN
600.0000 mg | Freq: Three times a day (TID) | INTRAVENOUS | Status: DC
Start: 2012-12-13 — End: 2012-12-13
  Filled 2012-12-13 (×2): qty 50

## 2012-12-13 MED ORDER — ONDANSETRON HCL 4 MG/2ML IJ SOLN
4.0000 mg | Freq: Four times a day (QID) | INTRAMUSCULAR | Status: DC | PRN
  Filled 2012-12-13: qty 2

## 2012-12-13 MED ORDER — HYDROMORPHONE HCL PF 1 MG/ML IJ SOLN
0.5000 mg | INTRAMUSCULAR | Status: DC | PRN
  Administered 2012-12-14: 0.5 mg via INTRAVENOUS
  Filled 2012-12-13: qty 1

## 2012-12-13 MED ORDER — VANCOMYCIN HCL IN DEXTROSE 750-5 MG/150ML-% IV SOLN
750.0000 mg | INTRAVENOUS | Status: DC
  Administered 2012-12-14 – 2012-12-15 (×2): 750 mg via INTRAVENOUS
  Filled 2012-12-13 (×3): qty 150

## 2012-12-13 MED ORDER — SODIUM CHLORIDE 0.9 % IV SOLN
INTRAVENOUS | Status: DC
  Administered 2012-12-13 – 2012-12-14 (×2): via INTRAVENOUS

## 2012-12-13 NOTE — Progress Notes (Signed)
ANTIBIOTIC CONSULT NOTE - INITIAL  Pharmacy Consult for Vancomycin Indication: abscess, cellulitis, MRSA on culture  Allergies  Allergen Reactions  . Neosporin [Neomycin-Bacitracin Zn-Polymyx]   . Other     Depression medicines: nightmares, rash, vertigo,  Unsure of rx names, has tried 2.  . Celexa [Citalopram Hydrobromide] Rash  . Sulfa Antibiotics Rash    Patient Measurements: Height: 5\' 4"  (162.6 cm) Weight: 125 lb 8 oz (56.926 kg) IBW/kg (Calculated) : 54.7 Adjusted Body Weight:   Vital Signs: Temp: 98.2 F (36.8 C) (08/18 1358) Temp src: Oral (08/18 1358) BP: 173/54 mmHg (08/18 1358) Pulse Rate: 62 (08/18 1358) Intake/Output from previous day:   Intake/Output from this shift:    Labs: No results found for this basename: WBC, HGB, PLT, LABCREA, CREATININE,  in the last 72 hours Estimated Creatinine Clearance: 40.4 ml/min (by C-G formula based on Cr of 0.75). No results found for this basename: VANCOTROUGH, Leodis Binet, VANCORANDOM, GENTTROUGH, GENTPEAK, GENTRANDOM, TOBRATROUGH, TOBRAPEAK, TOBRARND, AMIKACINPEAK, AMIKACINTROU, AMIKACIN,  in the last 72 hours   Microbiology: Recent Results (from the past 720 hour(s))  URINE CULTURE     Status: None   Collection Time    11/26/12  1:06 PM      Result Value Range Status   Specimen Description URINE, CATHETERIZED   Final   Special Requests NONE   Final   Culture  Setup Time 11/26/2012 19:50   Final   Colony Count NO GROWTH   Final   Culture NO GROWTH   Final   Report Status 11/27/2012 FINAL   Final  WOUND CULTURE     Status: None   Collection Time    12/10/12  1:30 PM      Result Value Range Status   Culture     Final   Value: Moderate METHICILLIN RESISTANT STAPHYLOCOCCUS AUREUS   Gram Stain No WBC Seen   Final   Gram Stain Rare Squamous Epithelial Cells Present   Final   Gram Stain Few Gram Positive Cocci In Pairs In Clusters   Final   Organism ID, Bacteria METHICILLIN RESISTANT STAPHYLOCOCCUS AUREUS   Final   Comment: Rifampin and Gentamicin should not be used as     single drugs for treatment of Staph infections.     This organism DOES NOT demonstrate inducible     Clindamycin resistance in vitro.    Medical History: Past Medical History  Diagnosis Date  . Hypertension   . Irregular heart beat     atrial fib per Dr Donnie Aho OV 5/13  . GERD (gastroesophageal reflux disease)   . Anxiety   . Arthritis   . Congestive heart failure   . Hepatitis A   . Bowel obstruction 04/2012  . Chronic UTI   . Bruises easily    Assessment: 89 yoF s/p loop colostomy placement 8/1 now with peristomal abscess and cellulitis.  Ostomy drained 8/15, pt started on oral antibiotics over the weekend without improvement, and is now admitted to [facility]  Pharmacy consulted to dose Vancomycin.    8/18 >> vancomycin >>    Tm24h: 98.2 WBC: CBC ordered Renal: BMET ordered, last SCr 0.75, CrCl 40 ml/min  8/15 Wound culture:  MRSA  Goal of Therapy:  Vancomycin trough level 15-20 mcg/ml  Plan:  1.  Vancomycin 1250mg  IV x 1, then 750mg  IV q 24 hours 2.  F/u renal function, vancomycin trough at steady state  Haynes Hoehn, PharmD 12/13/2012, 3:17 PM  Pager: [phone]

## 2012-12-13 NOTE — Progress Notes (Signed)
Son was notified.

## 2012-12-13 NOTE — Progress Notes (Signed)
Patient's BP running high tonight; 175/57.  Notified MD on call, no new orders at this time but will continue to monitor patient and notify MD if there is a significant increase in BP.

## 2012-12-13 NOTE — H&P (Signed)
No chief complaint on file.   HISTORY:  [name] is a 79 y.o. female who is s/p lap ostomy placement.  This was drained in the office on Fri and she was placed on PO antibiotics.  She did not get better over the weekend.  Her culture taken in the office showed MRSA.  It was decided to direct admit her.    Past Medical History  Diagnosis Date  . Hypertension   . Irregular heart beat     atrial fib per Dr Donnie Aho OV 5/13  . GERD (gastroesophageal reflux disease)   . Anxiety   . Arthritis   . Congestive heart failure   . Hepatitis A   . Bowel obstruction 04/2012  . Chronic UTI   . Bruises easily        Past Surgical History  Procedure Laterality Date  . Cataract extraction Bilateral   . Dilatation & currettage/hysteroscopy with resectocope    . Laparoscopic diverted colostomy N/A 11/26/2012    Procedure: LAPAROSCOPIC COLOSTOMY PLACEMENT RIGID  SIGMOIDOSCOPY;  Surgeon: Romie Levee, MD;  Location: [facility];  Service: General;  Laterality: N/A;      Current Facility-Administered Medications  Medication Dose Route Frequency Provider Last Rate Last Dose  . 0.9 %  sodium chloride infusion   Intravenous Continuous Romie Levee, MD 75 mL/hr at 12/13/12 1426    . enoxaparin (LOVENOX) injection 40 mg  40 mg Subcutaneous Q24H Romie Levee, MD      . HYDROmorphone (DILAUDID) injection 0.5 mg  0.5 mg Intravenous Q3H PRN Romie Levee, MD      . ondansetron [facility]) injection 4 mg  4 mg Intravenous Q6H PRN Romie Levee, MD      . oxyCODONE (Oxy IR/ROXICODONE) immediate release tablet 5 mg  5 mg Oral Q4H PRN Romie Levee, MD         Allergies  Allergen Reactions  . Neosporin [Neomycin-Bacitracin Zn-Polymyx]   . Other     Depression medicines: nightmares, rash, vertigo,  Unsure of rx names, has tried 2.  . Celexa [Citalopram Hydrobromide] Rash  . Sulfa Antibiotics Rash      Family History  Problem Relation Age of Onset  . Heart disease Mother   . Stroke Father   . Colon cancer  Neg Hx       History   Social History  . Marital Status: Widowed    Spouse Name: N/A    Number of Children: 1  . Years of Education: N/A   Occupational History  . retired    Social History Main Topics  . Smoking status: Former Smoker -- 6 years    Types: Cigarettes    Quit date: 11/11/1938  . Smokeless tobacco: Never Used  . Alcohol Use: No  . Drug Use: No  . Sexual Activity: None   Other Topics Concern  . None   Social History Narrative  . None       REVIEW OF SYSTEMS - PERTINENT POSITIVES ONLY: Review of Systems - General ROS: negative for - chills or fever Hematological and Lymphatic ROS: negative for - bleeding problems or blood clots Respiratory ROS: no cough, shortness of breath, or wheezing Cardiovascular ROS: no chest pain or dyspnea on exertion Gastrointestinal ROS: no abdominal pain, change in bowel habits, or black or bloody stools Genito-Urinary ROS: no dysuria, trouble voiding, or hematuria  EXAM: Filed Vitals:   12/13/12 1358  BP: 173/54  Pulse: 62  Temp: 98.2 F (36.8 C)  Resp: 16  General appearance: alert and cooperative Resp: clear to auscultation bilaterally Cardio: regular rate and rhythm GI: normal findings: soft, non-tender L flank abscess  LABORATORY RESULTS: Available labs are reviewed  Will repeat CBC today  ASSESSMENT AND PLAN: [name] is a 79 y.o. F with a peristomal abscess and cellulitis tracking to L flank.  Cultures taken in the office on Fri show MRSA.  I will admit for observation.  I will place on Vancomycin per pharmacy.  I have ordered an US guided drain to be placed in her abscess by radiology.      Vanita Panda, MD Colon and Rectal Surgery / General Surgery [facility]      Visit Diagnoses: No diagnosis found.  Primary Care Physician: Benita Stabile, MD

## 2012-12-13 NOTE — Telephone Encounter (Signed)
Pt still getting worse.  Will direct admit and start IV antibiotics.

## 2012-12-13 NOTE — Progress Notes (Signed)
IR asked to eval for perc drain of (L)lateral abdomen and flank abscess.  Pt s/p recent diverting colostomy for rectal stricture. She has developed MRSA+ abscess adjacent to ostomy and now cellulitis/abscess appears to be tracking laterally from ostomy.  On exam, large area of indurated/erythematous skin from lateral periostomal region, tracking slightly inferiorly and laterally towards flank. Palpable fluctuance c/w fluid collection.  Discussed with attending IR MD, Dr. Deanne Coffer who recommends better definition of abscess cavity by means of CT scan, have ordered CT for tonight. Then will move forward with perc drain either by CT or US guidance tomorrow.

## 2012-12-14 ENCOUNTER — Encounter (HOSPITAL_COMMUNITY): Payer: Self-pay | Admitting: Radiology

## 2012-12-14 ENCOUNTER — Inpatient Hospital Stay (HOSPITAL_COMMUNITY): Payer: Medicare Other

## 2012-12-14 LAB — CBC
Hemoglobin: 8.3 g/dL — ABNORMAL LOW (ref 12.0–15.0)
MCV: 84.3 fL (ref 78.0–100.0)
Platelets: 260 10*3/uL (ref 150–400)
RBC: 3.18 MIL/uL — ABNORMAL LOW (ref 3.87–5.11)
WBC: 6.2 10*3/uL (ref 4.0–10.5)

## 2012-12-14 LAB — BASIC METABOLIC PANEL
Calcium: 8.4 mg/dL (ref 8.4–10.5)
GFR calc Af Amer: >90 mL/min (ref >90)
GFR calc non Af Amer: 78 mL/min — ABNORMAL LOW (ref >90)
Potassium: 2.6 mEq/L — CL (ref 3.5–5.1)
Sodium: 137 mEq/L (ref 135–145)

## 2012-12-14 LAB — PROTIME-INR
INR: 1.19 (ref 0.00–1.49)
Prothrombin Time: 14.8 seconds (ref 11.6–15.2)

## 2012-12-14 MED ORDER — POLYETHYLENE GLYCOL 3350 17 G PO PACK
17.0000 g | PACK | Freq: Every day | ORAL | Status: DC
  Administered 2012-12-14 – 2012-12-15 (×2): 17 g via ORAL
  Filled 2012-12-14 (×2): qty 2

## 2012-12-14 MED ORDER — CARBOXYMETHYLCELLUL-GLYCERIN 0.5-0.9 % OP SOLN: 1.0000 [drp] | Freq: Every day | OPHTHALMIC | Status: DC | PRN

## 2012-12-14 MED ORDER — FENTANYL CITRATE 0.05 MG/ML IJ SOLN
INTRAMUSCULAR | Status: AC | PRN
  Administered 2012-12-14 (×2): 50 ug via INTRAVENOUS

## 2012-12-14 MED ORDER — POLYVINYL ALCOHOL 1.4 % OP SOLN
1.0000 [drp] | Freq: Every day | OPHTHALMIC | Status: DC | PRN
  Administered 2012-12-15: 1 [drp] via OPHTHALMIC
  Filled 2012-12-14: qty 15

## 2012-12-14 MED ORDER — ATENOLOL 50 MG PO TABS
50.0000 mg | ORAL_TABLET | Freq: Every day | ORAL | Status: DC
  Administered 2012-12-14 – 2012-12-17 (×4): 50 mg via ORAL
  Filled 2012-12-14 (×4): qty 1

## 2012-12-14 MED ORDER — POTASSIUM CHLORIDE 10 MEQ/100ML IV SOLN
10.0000 meq | INTRAVENOUS | Status: AC
  Administered 2012-12-14 (×4): 10 meq via INTRAVENOUS
  Filled 2012-12-14 (×4): qty 100

## 2012-12-14 MED ORDER — POLYETHYLENE GLYCOL 3350 17 GM/SCOOP PO POWD
8.5000 g | Freq: Every day | ORAL | Status: DC
  Filled 2012-12-14: qty 255

## 2012-12-14 MED ORDER — POTASSIUM GLUCONATE 595 (99 K) MG PO TABS
595.0000 mg | ORAL_TABLET | Freq: Every day | ORAL | Status: DC
  Administered 2012-12-14 – 2012-12-17 (×4): 595 mg via ORAL
  Filled 2012-12-14 (×4): qty 1

## 2012-12-14 MED ORDER — FOLIC ACID 1 MG PO TABS
1.0000 mg | ORAL_TABLET | Freq: Every day | ORAL | Status: DC
  Administered 2012-12-14 – 2012-12-17 (×4): 1 mg via ORAL
  Filled 2012-12-14 (×4): qty 1

## 2012-12-14 NOTE — Procedures (Signed)
Interventional Radiology Procedure Note  Procedure:  Superficial ill-defined parastomal abscess. Drain placed under US guidance.   Only 5 mL frank pus aspirated.  Drain connected to bulb suction.  Complications: None Recommendations: - Monitor output - FLush BID - Continue Abx - Culture sent and is pending - Fluid collection very complex and ill-defined.  Not sure if perc drainage will be sufficient.   Signed,  Sterling Big, MD Vascular & Interventional Radiologist [facility] Radiology

## 2012-12-14 NOTE — H&P (Signed)
HISTORY:  [name] is a 79 y.o. female who is s/p lap ostomy placement.  This was drained in the office on Fri and she was placed on PO antibiotics.  She did not get better over the weekend.  Her culture taken in the office showed MRSA.   She has been admitted for IV abx and IR is requested to perc drain abscess collection CT performed confirms (L)abdominal wall abscess with no deep component associated.  Past Medical History  Diagnosis Date  . Hypertension   . Irregular heart beat     atrial fib per Dr Donnie Aho OV 5/13  . GERD (gastroesophageal reflux disease)   . Anxiety   . Arthritis   . Congestive heart failure   . Hepatitis A   . Bowel obstruction 04/2012  . Chronic UTI   . Bruises easily        Past Surgical History  Procedure Laterality Date  . Cataract extraction Bilateral   . Dilatation & currettage/hysteroscopy with resectocope    . Laparoscopic diverted colostomy N/A 11/26/2012    Procedure: LAPAROSCOPIC COLOSTOMY PLACEMENT RIGID  SIGMOIDOSCOPY;  Surgeon: Romie Levee, MD;  Location: [facility];  Service: General;  Laterality: N/A;      Current Facility-Administered Medications  Medication Dose Route Frequency Provider Last Rate Last Dose  . 0.9 %  sodium chloride infusion   Intravenous Continuous Romie Levee, MD 20 mL/hr at 12/14/12 0820 20 mL/hr at 12/14/12 0820  . enoxaparin (LOVENOX) injection 40 mg  40 mg Subcutaneous Q24H Romie Levee, MD   40 mg at 12/13/12 1631  . HYDROmorphone (DILAUDID) injection 0.5 mg  0.5 mg Intravenous Q3H PRN Romie Levee, MD      . ondansetron [facility]) injection 4 mg  4 mg Intravenous Q6H PRN Romie Levee, MD      . oxyCODONE (Oxy IR/ROXICODONE) immediate release tablet 5 mg  5 mg Oral Q4H PRN Romie Levee, MD   5 mg at 12/13/12 2250  . potassium chloride 10 mEq in 100 mL IVPB  10 mEq Intravenous Q1 Hr x 4 Romie Levee, MD   10 mEq at 12/14/12 0820  . vancomycin (VANCOCIN) IVPB 750 mg/150 ml premix  750 mg Intravenous Q24H  Colleen E Summe, RPH         Allergies  Allergen Reactions  . Neosporin [Neomycin-Bacitracin Zn-Polymyx]   . Other     Depression medicines: nightmares, rash, vertigo,  Unsure of rx names, has tried 2.  . Celexa [Citalopram Hydrobromide] Rash  . Sulfa Antibiotics Rash      Family History  Problem Relation Age of Onset  . Heart disease Mother   . Stroke Father   . Colon cancer Neg Hx       History   Social History  . Marital Status: Widowed    Spouse Name: N/A    Number of Children: 1  . Years of Education: N/A   Occupational History  . retired    Social History Main Topics  . Smoking status: Former Smoker -- 6 years    Types: Cigarettes    Quit date: 11/11/1938  . Smokeless tobacco: Never Used  . Alcohol Use: No  . Drug Use: No  . Sexual Activity: None   Other Topics Concern  . None   Social History Narrative  . None       REVIEW OF SYSTEMS - PERTINENT POSITIVES ONLY: Review of Systems - General ROS: negative for - chills or fever Hematological and Lymphatic ROS: negative  for - bleeding problems or blood clots Respiratory ROS: no cough, shortness of breath, or wheezing Cardiovascular ROS: no chest pain or dyspnea on exertion Gastrointestinal ROS: no abdominal pain, change in bowel habits, or black or bloody stools Genito-Urinary ROS: no dysuria, trouble voiding, or hematuria  EXAM: Filed Vitals:   12/14/12 0631  BP: 155/56  Pulse: 56  Temp: 98.6 F (37 C)  Resp: 16    General appearance: alert and cooperative Resp: clear to auscultation bilaterally Cardio: regular rate and rhythm GI: normal findings: soft, non-tender L abd and flank erythema and induration, tender  CBC    Component Value Date/Time   WBC 6.2 12/14/2012 0351   RBC 3.18* 12/14/2012 0351   HGB 8.3* 12/14/2012 0351   HCT 26.8* 12/14/2012 0351   PLT 260 12/14/2012 0351   MCV 84.3 12/14/2012 0351   MCH 26.1 12/14/2012 0351   MCHC 31.0 12/14/2012 0351   RDW 16.4* 12/14/2012 0351    LYMPHSABS 2.1 11/24/2012 1030   MONOABS 1.3* 11/24/2012 1030   EOSABS 0.0 11/24/2012 1030   BASOSABS 0.0 11/24/2012 1030      ASSESSMENT AND PLAN: (L) perastomal and abdominal wall abscess Reviewed imaging, seems amenable to perc drain Discussed procedure with pt and family, risks, and care of drain after. Labs reviewed. Consent signed in chart     Brayton El PA-C Interventional Radiology 12/14/2012 9:23 AM

## 2012-12-14 NOTE — Progress Notes (Signed)
Subjective: Pt feels about the same.    Objective: Vital signs in last 24 hours: Temp:  [98.2 F (36.8 C)-98.6 F (37 C)] 98.6 F (37 C) (08/19 0631) Pulse Rate:  [56-62] 56 (08/19 0631) Resp:  [16] 16 (08/19 0631) BP: (155-175)/(54-59) 155/56 mmHg (08/19 0631) SpO2:  [97 %-100 %] 97 % (08/19 0631) Weight:  [125 lb 8 oz (56.926 kg)] 125 lb 8 oz (56.926 kg) (08/18 1358) Last BM Date: 12/13/12  Intake/Output from previous day: 08/18 0701 - 08/19 0700 In: 930 [P.O.:360; I.V.:320; IV Piggyback:250] Out: 1150 [Urine:1050; Stool:100] Intake/Output this shift:    General appearance: alert and cooperative GI: soft, non-tender; bowel sounds normal; no masses,  no organomegaly L flank erythema looks slightly better  Lab Results:  Results for orders placed during the hospital encounter of 12/13/12 (from the past 24 hour(s))  BASIC METABOLIC PANEL     Status: Abnormal   Collection Time    12/13/12  3:08 PM      Result Value Range   Sodium 136  135 - 145 mEq/L   Potassium 3.0 (*) 3.5 - 5.1 mEq/L   Chloride 100  96 - 112 mEq/L   CO2 23  19 - 32 mEq/L   Glucose, Bld 116 (*) 70 - 99 mg/dL   BUN 13  6 - 23 mg/dL   Creatinine, Ser 0.98  0.50 - 1.10 mg/dL   Calcium 9.2  8.4 - 11.9 mg/dL   GFR calc non Af Amer 77 (*) >90 mL/min   GFR calc Af Amer 90 (*) >90 mL/min  CBC     Status: Abnormal   Collection Time    12/13/12  3:08 PM      Result Value Range   WBC 6.4  4.0 - 10.5 K/uL   RBC 3.62 (*) 3.87 - 5.11 MIL/uL   Hemoglobin 9.5 (*) 12.0 - 15.0 g/dL   HCT 14.7 (*) 82.9 - 56.2 %   MCV 85.1  78.0 - 100.0 fL   MCH 26.2  26.0 - 34.0 pg   MCHC 30.8  30.0 - 36.0 g/dL   RDW 13.0 (*) 86.5 - 78.4 %   Platelets 290  150 - 400 K/uL  BASIC METABOLIC PANEL     Status: Abnormal   Collection Time    12/14/12  3:51 AM      Result Value Range   Sodium 137  135 - 145 mEq/L   Potassium 2.6 (*) 3.5 - 5.1 mEq/L   Chloride 103  96 - 112 mEq/L   CO2 25  19 - 32 mEq/L   Glucose, Bld 99  70 - 99  mg/dL   BUN 9  6 - 23 mg/dL   Creatinine, Ser 6.96  0.50 - 1.10 mg/dL   Calcium 8.4  8.4 - 29.5 mg/dL   GFR calc non Af Amer 78 (*) >90 mL/min   GFR calc Af Amer >90  >90 mL/min  CBC     Status: Abnormal   Collection Time    12/14/12  3:51 AM      Result Value Range   WBC 6.2  4.0 - 10.5 K/uL   RBC 3.18 (*) 3.87 - 5.11 MIL/uL   Hemoglobin 8.3 (*) 12.0 - 15.0 g/dL   HCT 28.4 (*) 13.2 - 44.0 %   MCV 84.3  78.0 - 100.0 fL   MCH 26.1  26.0 - 34.0 pg   MCHC 31.0  30.0 - 36.0 g/dL   RDW 10.2 (*) 72.5 -  15.5 %   Platelets 260  150 - 400 K/uL  PROTIME-INR     Status: None   Collection Time    12/14/12  3:51 AM      Result Value Range   Prothrombin Time 14.8  11.6 - 15.2 seconds   INR 1.19  0.00 - 1.49     Studies/Results Radiology     MEDS, Scheduled . enoxaparin (LOVENOX) injection  40 mg Subcutaneous Q24H  . potassium chloride  10 mEq Intravenous Q1 Hr x 4  . vancomycin  750 mg Intravenous Q24H     Assessment: <principal problem not specified> abscess  Plan: To IR for image guided drain placement. Cont Vanc KVO fluids Will restart diet after procedure   LOS: 1 day    Vanita Panda, MD [facility], [place] [phone]   12/14/2012 7:44 AM

## 2012-12-14 NOTE — H&P (Signed)
Agree with PA note.  Signed,  Heath K. McCullough, MD Vascular & Interventional Radiology [facility]  

## 2012-12-14 NOTE — Care Management Note (Addendum)
    Page 1 of 2   12/17/2012     12:10:31 PM   CARE MANAGEMENT NOTE 12/17/2012  Patient:  [name], [name]   Account Number:  [id number]  Date Initiated:  12/14/2012  Documentation initiated by:  [facility]  Subjective/Objective Assessment:   ADMITTED W/L FLANK PERIOSTOMAL ABSCESS.READMIT 8/1-12/01/12.     Action/Plan:   FROM HOME.HAS PCP,PHARMACY.   Anticipated DC Date:  12/17/2012   Anticipated DC Plan:  HOME W HOME HEALTH SERVICES      DC Planning Services  CM consult      Choice offered to / List presented to:  C-4 Adult Children        HH arranged  HH-1 RN      [facility] agency  [facility]   Status of service:  Completed, signed off Medicare Important Message given?   (If response is "NO", the following Medicare IM given date fields will be blank) Date Medicare IM given:   Date Additional Medicare IM given:    Discharge Disposition:  HOME W HOME HEALTH SERVICES  Per UR Regulation:  Reviewed for med. necessity/level of care/duration of stay  If discussed at Long Length of Stay Meetings, dates discussed:    Comments:  12/17/12 Dian Minahan RN,BSN NCM [phone] [facility] CHOSEN FOR HH, [facility] REP AWARE OF D/C TODAY HHRN ORDERED.PT/OT-HH,BUT D/C SUMMARY ALREADY PUT IN.TC DR. THOMAS OFFICE TEL#[phone] SPOKE TO NURSE BARBARA INFORMED OF PT/OT-HH.SINCE DR.ALICIA THOMAS UNAVAILABLE & NOT TO HOLD UP D/C,[facility] CAN CALL OFFICE ONCE HHRN HAS MADE HOME VISIT,& ASK FOR HHPT/OT.[facility] KRISTEN UPDATED.  12/16/12 Eniola Cerullo RN,BSN NCM [phone] SPOKE TO PATIENT'S SON JOSEPH C#[phone] ABOUT [facility]HC-USED [facility] IN PAST,TC [facility] TEL#[phone],THEY DO NOT HAVE HHRN STAFFING UNTIL NEXT WEEK AFTER 12/24/12,LEFT VM W/SON OFFERING TO CHOOSE ANOTHER HHC AGENCY W/MY CALL BACK TEL#.WILL NEED HHRN SPECIFIC HHRN ORDER FOR ABSCESS DRAIN MGMNT/FLUSH/MONITORING/COLOSTOMY CARE.  POD#2 PERCUTANEOUS DRAIN-ABSCESS.WILL PROVIDE HHC AGENCY LIST.HHRN ORDERED FOR ABSCESS DRAIN  MGMNT/MONITORING/COLOSTOMY INSTRUCTION.RECOMMEND PT/OT CONS.  12/14/12 Vikram Tillett RN,BSN NCM [phone] IR FOR PERCUTANEOUS DRAIN.

## 2012-12-15 ENCOUNTER — Encounter (INDEPENDENT_AMBULATORY_CARE_PROVIDER_SITE_OTHER): Payer: Medicare Other | Admitting: General Surgery

## 2012-12-15 MED ORDER — POLYVINYL ALCOHOL 1.4 % OP SOLN
1.0000 [drp] | Freq: Four times a day (QID) | OPHTHALMIC | Status: DC
  Administered 2012-12-15 – 2012-12-17 (×9): 1 [drp] via OPHTHALMIC

## 2012-12-15 NOTE — Progress Notes (Signed)
Subjective: Pt feeling a little better today ; still has some left abd soreness  Objective: Vital signs in last 24 hours: Temp:  [98 F (36.7 C)-98.6 F (37 C)] 98.1 F (36.7 C) (08/20 0611) Pulse Rate:  [52-75] 62 (08/20 0931) Resp:  [14-20] 18 (08/20 0611) BP: (133-206)/(46-143) 179/67 mmHg (08/20 0611) SpO2:  [97 %-100 %] 100 % (08/20 0611) Last BM Date: 12/13/12  Intake/Output from previous day: 08/19 0701 - 08/20 0700 In: 958.3 [P.O.:250; I.V.:553.3; IV Piggyback:150] Out: 1115 [Urine:900; Drains:15; Stool:200] Intake/Output this shift: Total I/O In: 200 [P.O.:200] Out: 300 [Urine:200; Stool:100]  Left abd drain intact, last recorded output 15 cc's reddish-beige fluid, cx's pend  Lab Results:   Recent Labs  12/13/12 1508 12/14/12 0351  WBC 6.4 6.2  HGB 9.5* 8.3*  HCT 30.8* 26.8*  PLT 290 260   BMET  Recent Labs  12/13/12 1508 12/14/12 0351  NA 136 137  K 3.0* 2.6*  CL 100 103  CO2 23 25  GLUCOSE 116* 99  BUN 13 9  CREATININE 0.61 0.59  CALCIUM 9.2 8.4   PT/INR  Recent Labs  12/14/12 0351  LABPROT 14.8  INR 1.19   Results for orders placed during the hospital encounter of 12/13/12  CULTURE, ROUTINE-ABSCESS     Status: None   Collection Time    12/14/12  4:33 PM      Result Value Range Status   Specimen Description ABSCESS   Final   Special Requests NONE SUPERFICIAL PARASTOMAL   Final   Gram Stain     Final   Value: ABUNDANT WBC PRESENT,BOTH PMN AND MONONUCLEAR     NO SQUAMOUS EPITHELIAL CELLS SEEN     ABUNDANT GRAM POSITIVE COCCI IN CLUSTERS     Performed at [facility]   Culture     Final   Value: NO GROWTH 1 DAY     Performed at [facility]   Report Status PENDING   Incomplete    ABG No results found for this basename: PHART, PCO2, PO2, HCO3,  in the last 72 hours  Studies/Results: Ct Abdomen Pelvis W Contrast  12/13/2012   *RADIOLOGY REPORT*  Clinical Data:  Patient status post diverting colostomy  placement, now a question of parastomal abscess.  CT ABDOMEN AND PELVIS WITH CONTRAST  Technique:  Multidetector CT imaging of the abdomen and pelvis was performed following the standard protocol during bolus administration of intravenous contrast.  Contrast: 80mL OMNIPAQUE IOHEXOL 300 MG/ML  SOLN  Comparison: CT 11/05/2012  Findings: Limited visualization of the lower thorax demonstrates minimal dependent atelectasis and/or scarring.  No pleural effusion.  Heart is enlarged.  Small hiatal hernia.  Redemonstrated multiple enhancing lesions within the left and right hepatic lobes.  Reference lesion within the medial left hepatic lobe measures 1.8 cm, previously 1.7 cm (image 21; series 3). There are additional enhancing lesions within the left hepatic lobe (image 17 and image 19) each measuring 1.2 cm.  An additional subtle enhancing lesion is identified within the right hepatic lobe (image 21; series 3) measuring 1.0 cm.  Portal vein is patent. Gallbladder is unremarkable.  The spleen, pancreas and bilateral adrenal glands are unremarkable. The kidneys enhance symmetrically with contrast.  Scattered calcified atherosclerotic plaque involving the abdominal aorta.  Mild circumferential wall thickening of the urinary bladder, grossly similar to prior examination. Uterus is grossly unremarkable.  The bilateral ovaries are prominent with the right ovary measuring 3.6 cm and the left ovary measuring 2.6 cm, these  contain multiple small cysts/follicles.  Overall these are grossly similar compared to the recent prior examinations.  Redemonstrated circumferential wall thickening of the rectum with perirectal fat stranding and presacral soft tissue.  Overall this appearance is somewhat improved when compared to most recent prior examination.  No definite presacral gas is identified on current examination.  Extensive descending and sigmoid colonic diverticulosis.  Interval placement of a left lower quadrant diverting  colostomy. Within the adjacent subcutaneous fat there is a 7.2 x 2.5 cm rim enhancing fluid collection most compatible with an abscess. No evidence for intra abdominal extension.  No evidence for bowel obstruction.  Marked lower lumbar spine degenerative change without aggressive appearing osseous lesions.Unchanged anterior height loss of the T11 vertebral body with lower lumbar spine degenerative change.  IMPRESSION: 1. Interval placement of a left lower quadrant diverting colostomy. There is a large 7.2 cm rim enhancing abscess within the surrounding soft tissues. No evidence for intra-abdominal extension.  2.  Persistent but slightly improved circumferential rectal wall thickening, perirectal fat stranding presacral soft tissue thickening.  While this may be secondary to an infectious/inflammatory process, malignancy is not excluded.  3.  Multiple nonspecific enhancing lesions within the liver.  These are incompletely characterized on single phase contrast enhanced CT.  Consider further evaluation with abdominal MRI.  4. Redemonstrated circumferential bladder wall thickening. Recommend correlation with urinalysis to exclude cystitis.  5. Prominent ovaries bilaterally containing small follicles/cystic structures.  Attention on follow-up.  Discussed with Dr. Andrey Campanile at 5:55 pm on December 13, 2012.   Original Report Authenticated By: Annia Belt, M.D   [place] Image Guided Fluid Drain By Catheter  12/15/2012   *RADIOLOGY REPORT*  ULTRASOUND GUIDED ABSCESS DRAIN PLACEMENT  Date: 12/14/2012  Clinical History: 79 year old female with left lower quadrant superficial parastomal cellulitis / abscess.  Procedures Performed: 1. Ultrasound guided abscess drain placement  Interventional Radiologist:  Sterling Big, MD  Sedation: 100 mcg Fentanyl administered intravenously  Sedation Time: 20 minutes  Fluoroscopy time: None  Contrast volume: None  PROCEDURE/FINDINGS:   Informed consent was obtained from the patient following  explanation of the procedure, risks, benefits and alternatives. The patient understands, agrees and consents for the procedure. All questions were addressed. A time out was performed.  Maximal barrier sterile technique utilized including caps, mask, sterile gowns, sterile gloves, large sterile drape, hand hygiene, and betadine skin prep.  The indurated region left lateral wall of the colostomy was interrogated with ultrasound.  There is an ill-defined superficial subcutaneous fluid collection.  Multiple small ribbons of complex fluid interdigitate between layers of adipose tissue.  The area is tender to the touch.  Local anesthesia was attained by infiltration of 1% lidocaine.  Under direct sonographic guidance, a 5-French Yueh catheter was advanced into the complex fluid collection.  A 0.035-inch Amplatz wire was carefully advanced into the ill-defined complex fluid collection.  The skin tract was serially dilated to 10-French and a Cook 10-French all-purpose drainage catheter was advanced over the wire and formed under ultrasound guidance.  A total of 5 ml of thick, frankly purulent material was successfully aspirated.  The catheter was then secured to this skin with an O Prolene suture and connected to bulb suction drainage.  IMPRESSION:  1.  Sonographic evaluation demonstrates an ill-defined highly complex left parastomal superficial collection.  There are small ribbons of complex fluid interdigitating between layers of adipose tissue.  Sonographically, the abscess appears significantly less well-organized than on the recent CT scan of the abdomen and  pelvis.  2.  Technically successful placement of a 10-French drainage catheter into the complex fluid collection.  A total of 5 ml of thick and frankly purulent material was successfully aspirated and sent for culture.  3.  Management:  Maintain drain to the bulb suction.  Hopefully, the collection will continue to organize and liquefy and evacuate through the  drain.  However, I am uncertain if this percutaneous drainage catheter will provide adequate drainage for this heavily loculated and complex superficial abscess.  Signed,  Sterling Big, MD Vascular & Interventional Radiologist [facility]   Original Report Authenticated By: Malachy Moan, M.D.    Anti-infectives: Anti-infectives   Start     Dose/Rate Route Frequency Ordered Stop   12/14/12 1600  vancomycin (VANCOCIN) IVPB 750 mg/150 ml premix     750 mg 150 mL/hr over 60 Minutes Intravenous Every 24 hours 12/13/12 1518     12/13/12 1600  vancomycin (VANCOCIN) 1,250 mg in sodium chloride 0.9 % 250 mL IVPB     1,250 mg 166.7 mL/hr over 90 Minutes Intravenous  Once 12/13/12 1518 12/13/12 1801   12/13/12 1430  clindamycin (CLEOCIN) IVPB 600 mg  Status:  Discontinued     600 mg 100 mL/hr over 30 Minutes Intravenous 3 times per day 12/13/12 1402 12/13/12 1451      Assessment/Plan: S/p left abd/parastomal abscess drainage 8/19; check final cx's, monitor labs, cont drain NS flushes; check f/u CT within 1 week of placement  LOS: 2 days    ALLRED,D [facility] 12/15/2012

## 2012-12-15 NOTE — Progress Notes (Signed)
  Subjective: Pt feels better  Objective: Vital signs in last 24 hours: Temp:  [98 F (36.7 C)-98.6 F (37 C)] 98.1 F (36.7 C) (08/20 0611) Pulse Rate:  [52-75] 62 (08/20 0931) Resp:  [14-20] 18 (08/20 0611) BP: (133-206)/(46-143) 179/67 mmHg (08/20 0611) SpO2:  [97 %-100 %] 100 % (08/20 0611) Last BM Date: 12/13/12  Intake/Output from previous day: 08/19 0701 - 08/20 0700 In: 958.3 [P.O.:250; I.V.:553.3; IV Piggyback:150] Out: 1115 [Urine:900; Drains:15; Stool:200] Intake/Output this shift: Total I/O In: 200 [P.O.:200] Out: 300 [Urine:200; Stool:100]  General appearance: alert and cooperative GI: soft, non-tender; bowel sounds normal; ostomy functioning L flank erythema looks much better, purulent drainage noted in JP  Lab Results:  Results for orders placed during the hospital encounter of 12/13/12 (from the past 24 hour(s))  CULTURE, ROUTINE-ABSCESS     Status: None   Collection Time    12/14/12  4:33 PM      Result Value Range   Specimen Description ABSCESS     Special Requests NONE SUPERFICIAL PARASTOMAL     Gram Stain       Value: ABUNDANT WBC PRESENT,BOTH PMN AND MONONUCLEAR     NO SQUAMOUS EPITHELIAL CELLS SEEN     ABUNDANT GRAM POSITIVE COCCI IN CLUSTERS     Performed at [facility]   Culture       Value: NO GROWTH 1 DAY     Performed at [facility]   Report Status PENDING       Studies/Results Radiology     MEDS, Scheduled . atenolol  50 mg Oral Daily  . enoxaparin (LOVENOX) injection  40 mg Subcutaneous Q24H  . folic acid  1 mg Oral Daily  . polyethylene glycol  17-34 g Oral Daily  . potassium gluconate  595 mg Oral Daily  . vancomycin  750 mg Intravenous Q24H     Assessment: <principal problem not specified> abscess  Plan: Cont Vanc for 24 more hours, then will switch to PO abx SLIV Cont reg diet  Most likely will d/c with drain on Fri.  Family requesting home health assistance  LOS: 2 days    Vanita Panda,  MD [facility], [place] [phone]   12/15/2012 10:07 AM

## 2012-12-16 MED ORDER — CLINDAMYCIN HCL 300 MG PO CAPS
300.0000 mg | ORAL_CAPSULE | Freq: Three times a day (TID) | ORAL | Status: DC
  Administered 2012-12-16 – 2012-12-17 (×3): 300 mg via ORAL
  Filled 2012-12-16 (×6): qty 1

## 2012-12-16 MED ORDER — ONDANSETRON HCL 4 MG PO TABS
4.0000 mg | ORAL_TABLET | Freq: Three times a day (TID) | ORAL | Status: DC | PRN
  Administered 2012-12-16: 4 mg via ORAL
  Filled 2012-12-16: qty 1

## 2012-12-16 NOTE — Progress Notes (Signed)
  Subjective: Pt feels better, c/o some L arm soreness around IV site  Objective: Vital signs in last 24 hours: Temp:  [97.9 F (36.6 C)-98.3 F (36.8 C)] 97.9 F (36.6 C) (08/20 2145) Pulse Rate:  [56-62] 62 (08/21 0428) Resp:  [18-20] 18 (08/21 0428) BP: (150-157)/(59-67) 154/66 mmHg (08/21 0428) SpO2:  [98 %-100 %] 98 % (08/21 0428) Weight:  [124 lb 8 oz (56.473 kg)] 124 lb 8 oz (56.473 kg) (08/21 0428) Last BM Date: 12/13/12  Intake/Output from previous day: 08/20 0701 - 08/21 0700 In: 1082.5 [P.O.:920; IV Piggyback:150] Out: 790 [Urine:480; Drains:10; Stool:300] Intake/Output this shift:    General appearance: alert and cooperative GI: soft, non-tender; bowel sounds normal; ostomy functioning L flank erythema still looks much better, min purulent drainage noted in JP  Lab Results:  No results found for this or any previous visit (from the past 24 hour(s)).   Studies/Results Radiology     MEDS, Scheduled . atenolol  50 mg Oral Daily  . enoxaparin (LOVENOX) injection  40 mg Subcutaneous Q24H  . folic acid  1 mg Oral Daily  . polyvinyl alcohol  1 drop Both Eyes QID  . potassium gluconate  595 mg Oral Daily  . vancomycin  750 mg Intravenous Q24H     Assessment: <principal problem not specified> abscess  Plan: will switch to PO abx Remove IV, warm compresses to L arm Cont reg diet  Most likely will d/c with drain on Fri.  Family requesting home health assistance  LOS: 3 days    Vanita Panda, MD [facility], [place] [phone]   12/16/2012 10:26 AM

## 2012-12-16 NOTE — Progress Notes (Signed)
Subjective: Patient feeling better today, denies any fever or chills. She does c/o left arm IV site being painful with redness and swelling.  Objective: Physical Exam: BP 154/66  Pulse 62  Temp(Src) 97.9 F (36.6 C) (Oral)  Resp 18  Ht 5\' 4"  (1.626 m)  Wt 124 lb 8 oz (56.473 kg)  BMI 21.36 kg/m2  SpO2 98%  Left abd drain C/D/I, no signs of bleeding, infection. Output 10 cc's reddish-beige fluid. LUE- IV site edematous with surrounding erythema.   Labs: CBC  Recent Labs  12/13/12 1508 12/14/12 0351  WBC 6.4 6.2  HGB 9.5* 8.3*  HCT 30.8* 26.8*  PLT 290 260   BMET  Recent Labs  12/13/12 1508 12/14/12 0351  NA 136 137  K 3.0* 2.6*  CL 100 103  CO2 23 25  GLUCOSE 116* 99  BUN 13 9  CREATININE 0.61 0.59  CALCIUM 9.2 8.4   LFT No results found for this basename: PROT, ALBUMIN, AST, ALT, ALKPHOS, BILITOT, BILIDIR, IBILI, LIPASE,  in the last 72 hours PT/INR  Recent Labs  12/14/12 0351  LABPROT 14.8  INR 1.19     Studies/Results: [place] Image Guided Fluid Drain By Catheter  12/15/2012   *RADIOLOGY REPORT*  ULTRASOUND GUIDED ABSCESS DRAIN PLACEMENT  Date: 12/14/2012  Clinical History: 79 year old female with left lower quadrant superficial parastomal cellulitis / abscess.  Procedures Performed: 1. Ultrasound guided abscess drain placement  Interventional Radiologist:  Sterling Big, MD  Sedation: 100 mcg Fentanyl administered intravenously  Sedation Time: 20 minutes  Fluoroscopy time: None  Contrast volume: None  PROCEDURE/FINDINGS:   Informed consent was obtained from the patient following explanation of the procedure, risks, benefits and alternatives. The patient understands, agrees and consents for the procedure. All questions were addressed. A time out was performed.  Maximal barrier sterile technique utilized including caps, mask, sterile gowns, sterile gloves, large sterile drape, hand hygiene, and betadine skin prep.  The indurated region left lateral wall of  the colostomy was interrogated with ultrasound.  There is an ill-defined superficial subcutaneous fluid collection.  Multiple small ribbons of complex fluid interdigitate between layers of adipose tissue.  The area is tender to the touch.  Local anesthesia was attained by infiltration of 1% lidocaine.  Under direct sonographic guidance, a 5-French Yueh catheter was advanced into the complex fluid collection.  A 0.035-inch Amplatz wire was carefully advanced into the ill-defined complex fluid collection.  The skin tract was serially dilated to 10-French and a Cook 10-French all-purpose drainage catheter was advanced over the wire and formed under ultrasound guidance.  A total of 5 ml of thick, frankly purulent material was successfully aspirated.  The catheter was then secured to this skin with an O Prolene suture and connected to bulb suction drainage.  IMPRESSION:  1.  Sonographic evaluation demonstrates an ill-defined highly complex left parastomal superficial collection.  There are small ribbons of complex fluid interdigitating between layers of adipose tissue.  Sonographically, the abscess appears significantly less well-organized than on the recent CT scan of the abdomen and pelvis.  2.  Technically successful placement of a 10-French drainage catheter into the complex fluid collection.  A total of 5 ml of thick and frankly purulent material was successfully aspirated and sent for culture.  3.  Management:  Maintain drain to the bulb suction.  Hopefully, the collection will continue to organize and liquefy and evacuate through the drain.  However, I am uncertain if this percutaneous drainage catheter will provide adequate drainage  for this heavily loculated and complex superficial abscess.  Signed,  Sterling Big, MD Vascular & Interventional Radiologist [facility] Radiology   Original Report Authenticated By: Malachy Moan, M.D.    Assessment/Plan: Parastomal abscess s/p drain placed 8/19 Output  10cc, Cx gram (+) cocci in clusters, afebrile, Wbc 6.2, on antibiotics. Per surgery note, most likely discharge Friday with drain. Remove IV LUE, warm compress to affected area.   LOS: 3 days    Cloretta Ned 12/16/2012 10:58 AM

## 2012-12-17 LAB — CULTURE, ROUTINE-ABSCESS

## 2012-12-17 NOTE — Evaluation (Signed)
Occupational Therapy Evaluation Patient Details Name: [name] MRN: [id number] DOB: Sep 24, 1920 Today's Date: 12/17/2012 Time: 4098-1191 OT Time Calculation (min): 31 min  OT Assessment / Plan / Recommendation History of present illness Pt was recently admitted for colostomy and  is now readmitted for lap ostomy for blank periostomal abscess   Clinical Impression   This 79 year old female was admitted for the above procedure.  She will benefit from skilled OT to increase activity tolerance for adls and transfers to 3:1 commode.  She was recently hospitalized earlier this month and still required some adl assistance at home.      OT Assessment  Patient needs continued OT Services    Follow Up Recommendations  Home health OT;Supervision/Assistance - 24 hour    Barriers to Discharge      Equipment Recommendations  None recommended by OT    Recommendations for Other Services    Frequency  Min 2X/week    Precautions / Restrictions Precautions Precautions: Fall Precaution Comments: colostomy, incontinent of urine ; jp drain Restrictions Weight Bearing Restrictions: No   Pertinent Vitals/Pain No c/o pain    ADL  Grooming: Wash/dry hands;Set up Where Assessed - Grooming: Unsupported sitting Upper Body Bathing: Supervision/safety Where Assessed - Upper Body Bathing: Unsupported sitting Lower Body Bathing: Maximal assistance Where Assessed - Lower Body Bathing: Supported sit to stand Upper Body Dressing: Minimal assistance Where Assessed - Upper Body Dressing: Unsupported sitting Lower Body Dressing: +1 Total assistance Where Assessed - Lower Body Dressing: Supported sit to stand Toilet Transfer: Hydrographic surveyor Method: Surveyor, minerals: Materials engineer and Hygiene: Moderate assistance Where Assessed - Toileting Clothing Manipulation and Hygiene: Sit to stand from 3-in-1 or toilet Equipment Used: Rolling  walker Transfers/Ambulation Related to ADLs: spt to 3:1 then walked 3 feet to recliner ADL Comments: Pt slept well but feels more deconditioned than usual.      OT Diagnosis: Generalized weakness  OT Problem List: Decreased strength;Decreased activity tolerance;Impaired balance (sitting and/or standing) OT Treatment Interventions: Self-care/ADL training;DME and/or AE instruction;Patient/family education;Therapeutic activities;Balance training   OT Goals(Current goals can be found in the care plan section) Acute Rehab OT Goals Patient Stated Goal: hopes to go home today OT Goal Formulation: With patient Time For Goal Achievement: 12/31/12 Potential to Achieve Goals: Good ADL Goals Pt Will Perform Grooming: with supervision;standing Pt Will Transfer to Toilet: with supervision;stand pivot transfer;bedside commode Pt Will Perform Toileting - Clothing Manipulation and hygiene: with supervision;sit to/from stand;sitting/lateral leans  Visit Information  Last OT Received On: 12/17/12 Assistance Needed: +1        Prior Functioning     Home Living Family/patient expects to be discharged to:: Private residence Living Arrangements: Other relatives (granddaughter:  works at [employer]) Available Help at Discharge: Family Type of Home: House Home Access: Stairs to enter Home Layout: Two level;Able to live on main level with bedroom/bathroom Home Equipment: Bedside commode;Shower seat;Wheelchair - [employer] - 2 wheels Additional Comments: by herself some Prior Function Level of Independence: Needs assistance Comments: help with adls.  She spends up to 4 hours alone Communication Communication: No difficulties         Vision/Perception     Cognition  Cognition Arousal/Alertness: Awake/alert Behavior During Therapy: WFL for tasks assessed/performed Overall Cognitive Status: Within Functional Limits for tasks assessed    Extremity/Trunk Assessment Upper Extremity  Assessment Upper Extremity Assessment: Overall WFL for tasks assessed     Mobility Bed Mobility Rolling Left:  5: Supervision;With rail Left Sidelying to Sit: 4: Min assist Details for Bed Mobility Assistance: assist for trunk Transfers Sit to Stand: 4: Min guard;From bed;From chair/3-in-1 Stand to Sit: 5: Supervision;To chair/3-in-1 Details for Transfer Assistance: steadying assist     Exercise     Balance     End of Session OT - End of Session Activity Tolerance: Patient tolerated treatment well Patient left: in chair;with call bell/phone within reach;with chair alarm set  GO     [name] 12/17/2012, 9:40 AM [name], OTR/L [phone] 12/17/2012

## 2012-12-17 NOTE — Progress Notes (Signed)
[facility]  Patient Status: New  [facility] is providing the following services: RN  If patient discharges after hours, please call [phone].   Lanae Crumbly 12/17/2012, 10:02 AM

## 2012-12-17 NOTE — Discharge Summary (Signed)
Physician Discharge Summary  Patient ID: [name] MRN: [id number] DOB/AGE: [birth year] 79 y.o.  Admit date: 12/13/2012 Discharge date: 12/17/2012  Admission Diagnoses: abscess   Discharge Diagnoses: same Active Problems:   * No active hospital problems. *   Discharged Condition: good  Hospital Course: The patient was admitted with a peristomal abscess.  Cultures showed MRSA.  She was placed on IV Vancomycin and a drain was placed in IR.  Her erythema resolved.  She was switched to PO clindamycin.  She will be discharged with her drain and a course of PO Clindamycin  Consults: IR  Significant Diagnostic Studies: labs: cbc, chemistry and radiology: CT scan: abscess, peristomal  Treatments: IV hydration, antibiotics: vancomycin, analgesia: acetaminophen w/ codeine and IR drain placement  Discharge Exam: Blood pressure 165/65, pulse 61, temperature 97.6 F (36.4 C), temperature source Oral, resp. rate 18, height 5\' 4"  (1.626 m), weight 125 lb 9.6 oz (56.972 kg), SpO2 100.00%. General appearance: alert and cooperative GI: soft, non-tender; bowel sounds normal; no masses,  no organomegaly Incision/Wound: drain with min purulent output, erythema resolving, mild induration on lateral edge of stoma  Disposition: 01-Home or Self Care   Future Appointments Provider Department Dept Phone   12/29/2012 3:35 PM Romie Levee, MD [facility], [place] [phone]       Medication List    ASK your doctor about these medications       amoxicillin-clavulanate 875-125 MG per tablet  Commonly known as:  AUGMENTIN  Take 1 tablet by mouth 2 (two) times daily. Take 1 tab BID x 7 days, started 8/14     atenolol 50 MG tablet  Commonly known as:  TENORMIN  Take 50 mg by mouth daily after breakfast.     clindamycin 150 MG capsule  Commonly known as:  CLEOCIN  Take 2 capsules (300 mg total) by mouth 3 (three) times daily.     fluconazole 100 MG tablet  Commonly known as:   DIFLUCAN  Take 1 tablet (100 mg total) by mouth daily.     folic acid 1 MG tablet  Commonly known as:  FOLVITE  Take 1 mg by mouth daily.     HYDROcodone-acetaminophen 5-325 MG per tablet  Commonly known as:  NORCO/VICODIN  Take 1-2 tablets by mouth every 4 (four) hours as needed.     OPTIVE 0.5-0.9 % Soln  Generic drug:  Carboxymethylcellul-Glycerin  Apply 1 drop to eye daily as needed (for dry eyes).     polyethylene glycol powder powder  Commonly known as:  GLYCOLAX/MIRALAX  Take 8.5-34 g by mouth daily. To correct constipation.  Adjust dose over 1-2 months.  Goal = ~1 bowel movement / day     potassium gluconate 595 MG Tabs tablet  Take 595 mg by mouth daily.           Follow-up Information   Follow up with Vanita Panda., MD In 5 days. (Wed 8/27 at 4:30)    Specialty:  General Surgery   Contact information:   [address], [address] [place] [address] [phone]       Signed: Vanita Panda 12/17/2012, 8:14 AM

## 2012-12-17 NOTE — Evaluation (Signed)
Physical Therapy Evaluation Patient Details Name: [name] MRN: [id number] DOB: 01-Jun-1920 Today's Date: 12/17/2012 Time: 1115-1130 PT Time Calculation (min): 15 min  PT Assessment / Plan / Recommendation History of Present Illness  Pt was recently admitted for colostomy adn is now readmitted for lap ostomy for blank periostomal abscess  Clinical Impression  Pt presents with some deconditioning.      PT Assessment  Patient needs continued PT services    Follow Up Recommendations  Home health PT    Does the patient have the potential to tolerate intense rehabilitation      Barriers to Discharge        Equipment Recommendations  None recommended by PT    Recommendations for Other Services     Frequency Min 3X/week    Precautions / Restrictions Precautions Precautions: Fall Precaution Comments: colostomy, incontinent of urine profoundly; jp drain Restrictions Weight Bearing Restrictions: No   Pertinent Vitals/Pain C/o some pain in right hip when lying on it      Mobility  Bed Mobility Bed Mobility: Rolling Right;Right Sidelying to Sit Rolling Right: 4: Min guard;With rail Rolling Left: 5: Supervision;With rail Right Sidelying to Sit: 5: Supervision Left Sidelying to Sit: 4: Min assist Supine to Sit: 5: Supervision Details for Bed Mobility Assistance: safety Transfers Transfers: Sit to Stand;Stand to Sit Sit to Stand: 4: Min guard;From bed;From chair/3-in-1 Stand to Sit: 5: Supervision;To chair/3-in-1 Details for Transfer Assistance: steadying assist pt reports her legs "feel shaky" today Ambulation/Gait Ambulation/Gait Assistance: 4: Min assist Ambulation Distance (Feet): 50 Feet Assistive device: Rolling walker Ambulation/Gait Assistance Details: safety Gait Pattern: Step-through pattern Gait velocity: decreased General Gait Details: Pt reports she feels weaker and shaky today.  She needed some encouragement and safety assist    Exercises Other  Exercises Other Exercises: static standing for endurance and core activation.  Pt lmited by fatigue   PT Diagnosis: Difficulty walking;Generalized weakness  PT Problem List: Decreased strength;Decreased activity tolerance;Decreased mobility;Decreased safety awareness PT Treatment Interventions: DME instruction;Gait training;Functional mobility training;Therapeutic activities;Patient/family education     PT Goals(Current goals can be found in the care plan section) Acute Rehab PT Goals Patient Stated Goal: to get stronger PT Goal Formulation: With patient Time For Goal Achievement: 12/31/12 Potential to Achieve Goals: Good  Visit Information  Last PT Received On: 12/17/12 Assistance Needed: +1 History of Present Illness: Pt was recently admitted for colostomy adn is now readmitted for lap ostomy for blank periostomal abscess       Prior Functioning  Home Living Family/patient expects to be discharged to:: Private residence Living Arrangements: Other relatives (granddaughter:  works at [employer]) Available Help at Discharge: Family Type of Home: House Home Access: Stairs to enter Home Layout: Two level;Able to live on main level with bedroom/bathroom Home Equipment: Bedside commode;Shower seat;Wheelchair - [employer] - 2 wheels Additional Comments: by herself some Prior Function Level of Independence: Needs assistance Gait / Transfers Assistance Needed: always has someone amb. w/ pt, Comments: help with adls.  She spends up to 4 hours alone Communication Communication: No difficulties    Cognition  Cognition Arousal/Alertness: Awake/alert Behavior During Therapy: WFL for tasks assessed/performed Overall Cognitive Status: Within Functional Limits for tasks assessed    Extremity/Trunk Assessment Upper Extremity Assessment Upper Extremity Assessment: Overall WFL for tasks assessed   Balance Balance Balance Assessed: Yes Static Sitting Balance Static Sitting - Balance Support:  Feet supported;No upper extremity supported Static Sitting - Level of Assistance: 7: Independent Static Standing Balance Static Standing -  Balance Support: Bilateral upper extremity supported Static Standing - Level of Assistance: 4: Min assist;5: Stand by assistance  End of Session PT - End of Session Activity Tolerance: Patient tolerated treatment well Patient left: in bed;with call bell/phone within reach;with bed alarm set Nurse Communication: Mobility status  GP    Bayard Hugger. Manson Passey, [place] [phone] 12/17/2012, 11:44 AM

## 2012-12-18 ENCOUNTER — Other Ambulatory Visit (INDEPENDENT_AMBULATORY_CARE_PROVIDER_SITE_OTHER): Payer: Self-pay | Admitting: Surgery

## 2012-12-18 ENCOUNTER — Telehealth (INDEPENDENT_AMBULATORY_CARE_PROVIDER_SITE_OTHER): Payer: Self-pay | Admitting: Surgery

## 2012-12-18 MED ORDER — HYDROCODONE-ACETAMINOPHEN 5-325 MG PO TABS: 1.0000 | ORAL_TABLET | ORAL | Status: DC | PRN

## 2012-12-18 NOTE — Telephone Encounter (Signed)
Home health  Called asking for pain med refill for pt for wound care.  Sent to pharmacy.

## 2012-12-22 ENCOUNTER — Encounter (INDEPENDENT_AMBULATORY_CARE_PROVIDER_SITE_OTHER): Payer: Self-pay | Admitting: General Surgery

## 2012-12-22 ENCOUNTER — Ambulatory Visit (INDEPENDENT_AMBULATORY_CARE_PROVIDER_SITE_OTHER): Payer: Medicare Other | Admitting: General Surgery

## 2012-12-22 VITALS — BP 142/70 | HR 64 | Temp 98.1°F | Resp 16 | Ht 64.0 in | Wt 124.0 lb

## 2012-12-22 DIAGNOSIS — Z9889 Other specified postprocedural states: Secondary | ICD-10-CM

## 2012-12-22 NOTE — Progress Notes (Signed)
[name] is a 79 y.o. female who is status post a drain placement in a peristomal abscess.  She is having some trouble with GI distress with the Clindamycin but is overall feeling better.  They are flushing the drain and basically just getting out what they put in.    Objective: Filed Vitals:   12/22/12 1639  BP: 142/70  Pulse: 64  Temp: 98.1 F (36.7 C)  Resp: 16    General appearance: alert and cooperative GI: normal findings: soft, non-tender and ostomy, less edematous  Incision: slight clear drainage present   Assessment: s/p  Patient Active Problem List   Diagnosis Date Noted  . Rectal stricture 11/29/2012  . Rectal mass 11/01/2012  . Diarrhea 05/13/2012  . HAP (hospital-acquired pneumonia) 05/10/2012  . Hypokalemia 05/05/2012  . Leukocytosis 05/02/2012  . SBO (small bowel obstruction) 05/01/2012  . Hyponatremia 05/01/2012  . HTN (hypertension) 05/01/2012  . Traumatic hematoma of foot 05/01/2012    Plan: Recovering from MRSA abscess.  Drain removed.  Continue complete course of antibiotics.  Return to the office in 2-3 weeks.    Vanita Panda, MD [facility], [place] [phone]   12/22/2012 5:10 PM

## 2012-12-22 NOTE — Patient Instructions (Signed)
Continue antibiotics for full course.  Call the office if any symptoms return

## 2012-12-23 ENCOUNTER — Telehealth (INDEPENDENT_AMBULATORY_CARE_PROVIDER_SITE_OTHER): Payer: Self-pay

## 2012-12-23 NOTE — Telephone Encounter (Signed)
Message copied by Ivory Broad on Thu Dec 23, 2012  5:05 PM ------      Message from: Larry Sierras      Created: Wed Dec 22, 2012  5:03 PM      Regarding: APPT      Contact: [phone]       CALL FOR 2 WK RECK APPT FROM 12-22-12/GM ------

## 2012-12-23 NOTE — Telephone Encounter (Signed)
I called and left the patient's son a message to call regarding follow up.  She has an appointment next week which we can leave there or push out 2 weeks.  Dr Maisie Fus is not here the 2nd week of September.

## 2012-12-29 ENCOUNTER — Encounter (INDEPENDENT_AMBULATORY_CARE_PROVIDER_SITE_OTHER): Payer: Medicare Other | Admitting: General Surgery

## 2013-01-10 ENCOUNTER — Ambulatory Visit (INDEPENDENT_AMBULATORY_CARE_PROVIDER_SITE_OTHER): Payer: Medicare Other | Admitting: General Surgery

## 2013-01-10 ENCOUNTER — Encounter (INDEPENDENT_AMBULATORY_CARE_PROVIDER_SITE_OTHER): Payer: Self-pay | Admitting: General Surgery

## 2013-01-10 VITALS — BP 124/70 | HR 68 | Resp 14 | Ht 64.0 in | Wt 125.4 lb

## 2013-01-10 DIAGNOSIS — Z9889 Other specified postprocedural states: Secondary | ICD-10-CM

## 2013-01-10 NOTE — Patient Instructions (Signed)
Call the office as needed 

## 2013-01-10 NOTE — Progress Notes (Signed)
[name] is a 79 y.o. female who is status post a drain placement in a peristomal abscess. She is feeling better.  She is sore from falling in her bathroom a couple days ago.  Her urinary symptoms are a little better now Objective:  Filed Vitals:   01/10/13 1419  BP: 124/70  Pulse: 68  Resp: 14   General appearance: alert and cooperative  GI: normal findings: soft, non-tender and ostomy, less edematous  Incision: slight clear drainage present  Assessment:  s/p  Patient Active Problem List    Diagnosis  Date Noted   .  Rectal stricture  11/29/2012   .  Rectal mass  11/01/2012   .  Diarrhea  05/13/2012   .  HAP (hospital-acquired pneumonia)  05/10/2012   .  Hypokalemia  05/05/2012   .  Leukocytosis  05/02/2012   .  SBO (small bowel obstruction)  05/01/2012   .  Hyponatremia  05/01/2012   .  HTN (hypertension)  05/01/2012   .  Traumatic hematoma of foot  05/01/2012   Plan:  Recovering from MRSA abscess. Doing well. Return to the office prn.  Vanita Panda, MD  [facility], [place]  [phone]

## 2013-04-03 ENCOUNTER — Inpatient Hospital Stay (HOSPITAL_COMMUNITY)
Admission: EM | Admit: 2013-04-03 | Discharge: 2013-04-06 | DRG: 292 | Disposition: A | Discharge status: Home Health | Payer: Medicare Other | Attending: Internal Medicine | Admitting: Internal Medicine

## 2013-04-03 ENCOUNTER — Other Ambulatory Visit: Payer: Self-pay

## 2013-04-03 ENCOUNTER — Encounter (HOSPITAL_COMMUNITY): Payer: Self-pay | Admitting: Emergency Medicine

## 2013-04-03 ENCOUNTER — Emergency Department (HOSPITAL_COMMUNITY): Payer: Medicare Other

## 2013-04-03 DIAGNOSIS — I5033 Acute on chronic diastolic (congestive) heart failure: Secondary | ICD-10-CM

## 2013-04-03 DIAGNOSIS — Y95 Nosocomial condition: Secondary | ICD-10-CM

## 2013-04-03 DIAGNOSIS — IMO0002 Reserved for concepts with insufficient information to code with codable children: Secondary | ICD-10-CM

## 2013-04-03 DIAGNOSIS — K6289 Other specified diseases of anus and rectum: Secondary | ICD-10-CM

## 2013-04-03 DIAGNOSIS — D509 Iron deficiency anemia, unspecified: Secondary | ICD-10-CM

## 2013-04-03 DIAGNOSIS — K219 Gastro-esophageal reflux disease without esophagitis: Secondary | ICD-10-CM | POA: Diagnosis present

## 2013-04-03 DIAGNOSIS — I059 Rheumatic mitral valve disease, unspecified: Secondary | ICD-10-CM | POA: Diagnosis present

## 2013-04-03 DIAGNOSIS — D529 Folate deficiency anemia, unspecified: Secondary | ICD-10-CM | POA: Diagnosis present

## 2013-04-03 DIAGNOSIS — I509 Heart failure, unspecified: Secondary | ICD-10-CM

## 2013-04-03 DIAGNOSIS — K56609 Unspecified intestinal obstruction, unspecified as to partial versus complete obstruction: Secondary | ICD-10-CM

## 2013-04-03 DIAGNOSIS — I10 Essential (primary) hypertension: Secondary | ICD-10-CM

## 2013-04-03 DIAGNOSIS — Z87440 Personal history of urinary (tract) infections: Secondary | ICD-10-CM

## 2013-04-03 DIAGNOSIS — R197 Diarrhea, unspecified: Secondary | ICD-10-CM

## 2013-04-03 DIAGNOSIS — Z66 Do not resuscitate: Secondary | ICD-10-CM | POA: Diagnosis present

## 2013-04-03 DIAGNOSIS — N184 Chronic kidney disease, stage 4 (severe): Secondary | ICD-10-CM | POA: Diagnosis present

## 2013-04-03 DIAGNOSIS — B159 Hepatitis A without hepatic coma: Secondary | ICD-10-CM | POA: Diagnosis present

## 2013-04-03 DIAGNOSIS — Z79899 Other long term (current) drug therapy: Secondary | ICD-10-CM

## 2013-04-03 DIAGNOSIS — N183 Chronic kidney disease, stage 3 unspecified: Secondary | ICD-10-CM

## 2013-04-03 DIAGNOSIS — Z87891 Personal history of nicotine dependence: Secondary | ICD-10-CM

## 2013-04-03 DIAGNOSIS — F411 Generalized anxiety disorder: Secondary | ICD-10-CM | POA: Diagnosis present

## 2013-04-03 DIAGNOSIS — T502X5A Adverse effect of carbonic-anhydrase inhibitors, benzothiadiazides and other diuretics, initial encounter: Secondary | ICD-10-CM | POA: Diagnosis present

## 2013-04-03 DIAGNOSIS — D696 Thrombocytopenia, unspecified: Secondary | ICD-10-CM

## 2013-04-03 DIAGNOSIS — N3289 Other specified disorders of bladder: Secondary | ICD-10-CM | POA: Diagnosis present

## 2013-04-03 DIAGNOSIS — J189 Pneumonia, unspecified organism: Secondary | ICD-10-CM

## 2013-04-03 DIAGNOSIS — I498 Other specified cardiac arrhythmias: Secondary | ICD-10-CM | POA: Diagnosis present

## 2013-04-03 DIAGNOSIS — Z8249 Family history of ischemic heart disease and other diseases of the circulatory system: Secondary | ICD-10-CM

## 2013-04-03 DIAGNOSIS — R0602 Shortness of breath: Secondary | ICD-10-CM

## 2013-04-03 DIAGNOSIS — Z8614 Personal history of Methicillin resistant Staphylococcus aureus infection: Secondary | ICD-10-CM

## 2013-04-03 DIAGNOSIS — I129 Hypertensive chronic kidney disease with stage 1 through stage 4 chronic kidney disease, or unspecified chronic kidney disease: Secondary | ICD-10-CM | POA: Diagnosis present

## 2013-04-03 DIAGNOSIS — K624 Stenosis of anus and rectum: Secondary | ICD-10-CM

## 2013-04-03 DIAGNOSIS — Z823 Family history of stroke: Secondary | ICD-10-CM

## 2013-04-03 DIAGNOSIS — E441 Mild protein-calorie malnutrition: Secondary | ICD-10-CM | POA: Diagnosis present

## 2013-04-03 DIAGNOSIS — E876 Hypokalemia: Secondary | ICD-10-CM

## 2013-04-03 DIAGNOSIS — D72829 Elevated white blood cell count, unspecified: Secondary | ICD-10-CM

## 2013-04-03 DIAGNOSIS — R001 Bradycardia, unspecified: Secondary | ICD-10-CM

## 2013-04-03 DIAGNOSIS — I4891 Unspecified atrial fibrillation: Secondary | ICD-10-CM | POA: Diagnosis present

## 2013-04-03 DIAGNOSIS — I5043 Acute on chronic combined systolic (congestive) and diastolic (congestive) heart failure: Principal | ICD-10-CM

## 2013-04-03 DIAGNOSIS — J019 Acute sinusitis, unspecified: Secondary | ICD-10-CM

## 2013-04-03 DIAGNOSIS — E871 Hypo-osmolality and hyponatremia: Secondary | ICD-10-CM

## 2013-04-03 HISTORY — DX: Chronic combined systolic (congestive) and diastolic (congestive) heart failure: I50.42

## 2013-04-03 HISTORY — DX: Paroxysmal atrial fibrillation: I48.0

## 2013-04-03 LAB — CBC WITH DIFFERENTIAL/PLATELET
Basophils Relative: 0 % (ref 0–1)
Eosinophils Relative: 0 % (ref 0–5)
Hemoglobin: 9.3 g/dL — ABNORMAL LOW (ref 12.0–15.0)
Lymphs Abs: 1.7 10*3/uL (ref 0.7–4.0)
MCH: 26.3 pg (ref 26.0–34.0)
MCV: 85.6 fL (ref 78.0–100.0)
Monocytes Absolute: 0.9 10*3/uL (ref 0.1–1.0)
Monocytes Relative: 19 % — ABNORMAL HIGH (ref 3–12)
Neutrophils Relative %: 43 % (ref 43–77)
RBC: 3.53 MIL/uL — ABNORMAL LOW (ref 3.87–5.11)
WBC: 4.5 10*3/uL (ref 4.0–10.5)

## 2013-04-03 LAB — COMPREHENSIVE METABOLIC PANEL
AST: 33 U/L (ref 0–37)
Albumin: 3.8 g/dL (ref 3.5–5.2)
Alkaline Phosphatase: 61 U/L (ref 39–117)
CO2: 23 mEq/L (ref 19–32)
Chloride: 105 mEq/L (ref 96–112)
Creatinine, Ser: 0.98 mg/dL (ref 0.50–1.10)
GFR calc non Af Amer: 49 mL/min — ABNORMAL LOW (ref >90)
Potassium: 3 mEq/L — ABNORMAL LOW (ref 3.5–5.1)
Total Bilirubin: 0.7 mg/dL (ref 0.3–1.2)

## 2013-04-03 LAB — PRO B NATRIURETIC PEPTIDE: Pro B Natriuretic peptide (BNP): 13219 pg/mL — ABNORMAL HIGH (ref 0–450)

## 2013-04-03 LAB — MAGNESIUM: Magnesium: 2.2 mg/dL (ref 1.5–2.5)

## 2013-04-03 MED ORDER — ALPRAZOLAM 0.25 MG PO TABS
0.1250 mg | ORAL_TABLET | Freq: Three times a day (TID) | ORAL | Status: DC | PRN
  Administered 2013-04-03 – 2013-04-05 (×2): 0.125 mg via ORAL
  Filled 2013-04-03 (×2): qty 1

## 2013-04-03 MED ORDER — AMOXICILLIN-POT CLAVULANATE 500-125 MG PO TABS
1.0000 | ORAL_TABLET | Freq: Two times a day (BID) | ORAL | Status: DC
  Administered 2013-04-03 – 2013-04-06 (×7): 500 mg via ORAL
  Filled 2013-04-03 (×8): qty 1

## 2013-04-03 MED ORDER — POTASSIUM CHLORIDE CRYS ER 20 MEQ PO TBCR
40.0000 meq | EXTENDED_RELEASE_TABLET | Freq: Once | ORAL | Status: AC
  Administered 2013-04-03: 40 meq via ORAL
  Filled 2013-04-03: qty 2

## 2013-04-03 MED ORDER — POTASSIUM CHLORIDE CRYS ER 20 MEQ PO TBCR
40.0000 meq | EXTENDED_RELEASE_TABLET | Freq: Every day | ORAL | Status: DC
  Administered 2013-04-03: 40 meq via ORAL
  Filled 2013-04-03 (×2): qty 2

## 2013-04-03 MED ORDER — IRON 325 (65 FE) MG PO TABS: 325.0000 mg | ORAL_TABLET | Freq: Every day | ORAL | Status: DC

## 2013-04-03 MED ORDER — FUROSEMIDE 10 MG/ML IJ SOLN
40.0000 mg | Freq: Once | INTRAMUSCULAR | Status: AC
  Administered 2013-04-03: 40 mg via INTRAVENOUS
  Filled 2013-04-03: qty 4

## 2013-04-03 MED ORDER — FOLIC ACID 1 MG PO TABS
1.0000 mg | ORAL_TABLET | Freq: Every day | ORAL | Status: DC
  Administered 2013-04-04 – 2013-04-06 (×3): 1 mg via ORAL
  Filled 2013-04-03 (×3): qty 1

## 2013-04-03 MED ORDER — SODIUM CHLORIDE 0.9 % IV SOLN: 250.0000 mL | INTRAVENOUS | Status: DC | PRN

## 2013-04-03 MED ORDER — FUROSEMIDE 10 MG/ML IJ SOLN
40.0000 mg | Freq: Two times a day (BID) | INTRAMUSCULAR | Status: DC
  Administered 2013-04-03: 18:00:00 40 mg via INTRAVENOUS
  Filled 2013-04-03 (×4): qty 4

## 2013-04-03 MED ORDER — LORATADINE 10 MG PO TABS
10.0000 mg | ORAL_TABLET | Freq: Every day | ORAL | Status: DC
  Administered 2013-04-03 – 2013-04-06 (×4): 10 mg via ORAL
  Filled 2013-04-03 (×4): qty 1

## 2013-04-03 MED ORDER — SODIUM CHLORIDE 0.9 % IJ SOLN: 3.0000 mL | INTRAMUSCULAR | Status: DC | PRN

## 2013-04-03 MED ORDER — POLYVINYL ALCOHOL 1.4 % OP SOLN
1.0000 [drp] | Freq: Every day | OPHTHALMIC | Status: DC | PRN
  Administered 2013-04-03 – 2013-04-05 (×2): 1 [drp] via OPHTHALMIC
  Filled 2013-04-03: qty 15

## 2013-04-03 MED ORDER — HYDRALAZINE HCL 20 MG/ML IJ SOLN: 10.0000 mg | INTRAMUSCULAR | Status: DC | PRN

## 2013-04-03 MED ORDER — ENOXAPARIN SODIUM 30 MG/0.3ML ~~LOC~~ SOLN
30.0000 mg | SUBCUTANEOUS | Status: DC
  Administered 2013-04-03: 30 mg via SUBCUTANEOUS
  Filled 2013-04-03 (×2): qty 0.3

## 2013-04-03 MED ORDER — SODIUM CHLORIDE 0.9 % IJ SOLN
3.0000 mL | Freq: Two times a day (BID) | INTRAMUSCULAR | Status: DC
  Administered 2013-04-03 – 2013-04-06 (×5): 3 mL via INTRAVENOUS

## 2013-04-03 MED ORDER — ONDANSETRON HCL 4 MG/2ML IJ SOLN: 4.0000 mg | Freq: Four times a day (QID) | INTRAMUSCULAR | Status: DC | PRN

## 2013-04-03 MED ORDER — OXYMETAZOLINE HCL 0.05 % NA SOLN
1.0000 | Freq: Two times a day (BID) | NASAL | Status: AC
  Administered 2013-04-03 – 2013-04-05 (×5): 1 via NASAL
  Filled 2013-04-03: qty 15

## 2013-04-03 MED ORDER — ATENOLOL 50 MG PO TABS
50.0000 mg | ORAL_TABLET | Freq: Every day | ORAL | Status: DC
  Administered 2013-04-04: 10:00:00 50 mg via ORAL
  Filled 2013-04-03: qty 1

## 2013-04-03 MED ORDER — FERROUS SULFATE 325 (65 FE) MG PO TABS
325.0000 mg | ORAL_TABLET | Freq: Every day | ORAL | Status: DC
  Administered 2013-04-04 – 2013-04-06 (×3): 325 mg via ORAL
  Filled 2013-04-03 (×4): qty 1

## 2013-04-03 MED ORDER — ISOSORB DINITRATE-HYDRALAZINE 20-37.5 MG PO TABS
1.0000 | ORAL_TABLET | Freq: Two times a day (BID) | ORAL | Status: DC
  Administered 2013-04-03 – 2013-04-04 (×3): 1 via ORAL
  Filled 2013-04-03 (×4): qty 1

## 2013-04-03 MED ORDER — HYDRALAZINE HCL 20 MG/ML IJ SOLN
10.0000 mg | Freq: Once | INTRAMUSCULAR | Status: AC
  Administered 2013-04-03: 10 mg via INTRAVENOUS
  Filled 2013-04-03: qty 1

## 2013-04-03 MED ORDER — CARBOXYMETHYLCELLUL-GLYCERIN 0.5-0.9 % OP SOLN: 1.0000 [drp] | Freq: Every day | OPHTHALMIC | Status: DC | PRN

## 2013-04-03 MED ORDER — ACETAMINOPHEN 500 MG PO TABS
500.0000 mg | ORAL_TABLET | Freq: Four times a day (QID) | ORAL | Status: DC | PRN
  Administered 2013-04-03 – 2013-04-05 (×6): 500 mg via ORAL
  Filled 2013-04-03 (×6): qty 1

## 2013-04-03 MED ORDER — SALINE SPRAY 0.65 % NA SOLN
1.0000 | NASAL | Status: DC | PRN
  Administered 2013-04-03 – 2013-04-06 (×2): 1 via NASAL
  Filled 2013-04-03: qty 44

## 2013-04-03 NOTE — ED Notes (Signed)
Family states that pt has been having increased sob, not able to shift or walking. BP has been elevated. Took xanax last night to help.

## 2013-04-03 NOTE — Progress Notes (Signed)
  Echocardiogram 2D Echocardiogram has been performed.  [name] 04/03/2013, 2:25 PM

## 2013-04-03 NOTE — ED Provider Notes (Signed)
CSN: [id number]     Arrival date & time 04/03/13  8295 History   First MD Initiated Contact with Patient 04/03/13 [phone]     Chief Complaint  Patient presents with  . Shortness of Breath   (Consider location/radiation/quality/duration/timing/severity/associated sxs/prior Treatment) HPI Comments: Patient presents with shortness of breath. The patient's crit is a little bit of increased shortness of breath over the last 2-3 weeks. It's gotten worse over last 2-3 days where she short of breath if she moves around at all and at rest. She's had some coughing and congestion in her nose and her throat. She denies he fevers. She denies a chest pain or tightness. She's been having some intermittent pain in her back for about a week after she bent over to put something in the washing machine and with this movement, started to have back pain. She currently denies any back pain. She has some chronic edema in her lower extremities which waxes and wanes but is unchanged from baseline currently. She has a history of atrial fibrillation and her son noticed a heart rate was irregular last night. She denies a history of lung problems in the past. She denies a history of past cardiac problems however she has seen Dr. Donnie Aho in the past for atrial fibrillation and also has a documented history of congestive heart failure. She's not on anticoagulants.  Patient is a 79 y.o. female presenting with shortness of breath.  Shortness of Breath Associated symptoms: cough   Associated symptoms: no abdominal pain, no chest pain, no diaphoresis, no fever, no headaches, no rash and no vomiting     Past Medical History  Diagnosis Date  . Hypertension   . Irregular heart beat     atrial fib per Dr Donnie Aho OV 5/13  . GERD (gastroesophageal reflux disease)   . Anxiety   . Arthritis   . Congestive heart failure   . Hepatitis A   . Bowel obstruction 04/2012  . Chronic UTI   . Bruises easily    Past Surgical History  Procedure  Laterality Date  . Cataract extraction Bilateral   . Dilatation & currettage/hysteroscopy with resectocope    . Laparoscopic diverted colostomy N/A 11/26/2012    Procedure: LAPAROSCOPIC COLOSTOMY PLACEMENT RIGID  SIGMOIDOSCOPY;  Surgeon: Romie Levee, MD;  Location: [facility];  Service: General;  Laterality: N/A;   Family History  Problem Relation Age of Onset  . Heart disease Mother   . Stroke Father   . Colon cancer Neg Hx    History  Substance Use Topics  . Smoking status: Former Smoker -- 6 years    Types: Cigarettes    Quit date: 11/11/1938  . Smokeless tobacco: Never Used  . Alcohol Use: No   OB History   Grav Para Term Preterm Abortions TAB SAB Ect Mult Living                 Review of Systems  Constitutional: Positive for fatigue. Negative for fever, chills and diaphoresis.  HENT: Positive for congestion, rhinorrhea and sinus pressure. Negative for sneezing.   Eyes: Negative.   Respiratory: Positive for cough and shortness of breath. Negative for chest tightness.   Cardiovascular: Positive for leg swelling. Negative for chest pain.  Gastrointestinal: Negative for nausea, vomiting, abdominal pain, diarrhea and blood in stool.  Genitourinary: Negative for frequency, hematuria, flank pain and difficulty urinating.  Musculoskeletal: Negative for arthralgias and back pain.  Skin: Negative for rash.  Neurological: Negative for dizziness, speech difficulty, weakness,  numbness and headaches.    Allergies  Neosporin; Other; Celexa; and Sulfa antibiotics  Home Medications   Current Outpatient Rx  Name  Route  Sig  Dispense  Refill  . acetaminophen (TYLENOL) 500 MG tablet   Oral   Take 500 mg by mouth every 6 (six) hours as needed for moderate pain.         Marland Kitchen ALPRAZolam (XANAX) 0.25 MG tablet   Oral   Take 0.25 mg by mouth 3 (three) times daily as needed for anxiety.         Marland Kitchen atenolol (TENORMIN) 50 MG tablet   Oral   Take 50 mg by mouth daily after breakfast.          . Carboxymethylcellul-Glycerin (OPTIVE) 0.5-0.9 % SOLN   Ophthalmic   Apply 1 drop to eye daily as needed (for dry eyes).         . chlorpheniramine (ALLER-CHLOR) 4 MG tablet   Oral   Take 4 mg by mouth 2 (two) times daily as needed for allergies.         . clindamycin (CLEOCIN) 150 MG capsule               . Ferrous Sulfate (IRON) 325 (65 FE) MG TABS   Oral   Take 325 mg by mouth daily.         . folic acid (FOLVITE) 1 MG tablet   Oral   Take 1 mg by mouth daily.         . potassium gluconate 595 MG TABS   Oral   Take 595 mg by mouth daily.          BP 127/102  Pulse 63  Temp(Src) 97.9 F (36.6 C) (Oral)  Resp 21  SpO2 99% Physical Exam  Constitutional: She is oriented to person, place, and time. She appears well-developed and well-nourished.  HENT:  Head: Normocephalic and atraumatic.  Eyes: Pupils are equal, round, and reactive to light.  Neck: Normal range of motion. Neck supple.  Cardiovascular: Normal rate, regular rhythm and normal heart sounds.   Pulmonary/Chest: Effort normal. No respiratory distress. She has no wheezes. She has rales (Shows crackles in the bases bilaterally, more on the left). She exhibits no tenderness.  Abdominal: Soft. Bowel sounds are normal. There is no tenderness. There is no rebound and no guarding.  Musculoskeletal: Normal range of motion. She exhibits edema.  Mild pitting edema in the legs bilaterally No calf tenderness  Lymphadenopathy:    She has no cervical adenopathy.  Neurological: She is alert and oriented to person, place, and time.  Skin: Skin is warm and dry. No rash noted.  Psychiatric: She has a normal mood and affect.    ED Course  Procedures (including critical care time) Labs Review Labs Reviewed  CBC WITH DIFFERENTIAL - Abnormal; Notable for the following:    RBC 3.53 (*)    Hemoglobin 9.3 (*)    HCT 30.2 (*)    RDW 19.1 (*)    Platelets 103 (*)    Monocytes Relative 19 (*)    All  other components within normal limits  COMPREHENSIVE METABOLIC PANEL - Abnormal; Notable for the following:    Potassium 3.0 (*)    Glucose, Bld 126 (*)    GFR calc non Af Amer 49 (*)    GFR calc Af Amer 57 (*)    All other components within normal limits  PRO B NATRIURETIC PEPTIDE - Abnormal; Notable for the following:  Pro B Natriuretic peptide (BNP) 13219.0 (*)    All other components within normal limits  TROPONIN I   Imaging Review Dg Chest 2 View  04/03/2013   CLINICAL DATA:  Shortness of breath and weakness  EXAM: CHEST  2 VIEW  COMPARISON:  09/21/2012  FINDINGS: There is bowed cardiac enlargement. Small bilateral pleural effusions and mild interstitial edema is identified. No airspace consolidation noted.  IMPRESSION: 1. Mild CHF.   Electronically Signed   By: Signa Kell M.D.   On: 04/03/2013 10:19    EKG Interpretation   None       Date: 04/03/2013  Rate: 63  Rhythm: normal sinus rhythm  QRS Axis: normal  Intervals: normal  ST/T Wave abnormalities: nonspecific ST/T changes  Conduction Disutrbances:none  Narrative Interpretation:   Old EKG Reviewed: none available, compared to EKG from 1/14, unchanged   MDM   1. CHF (congestive heart failure)     Pt presents with increased SOB.  Has some mild increased congestion on chest x-ray. She gets very cachectic when she moves around. Her oxygen saturation strop to 88-89% when she starts moving around. There is no evidence of pneumonia. She has no symptoms of the more suggestive of pulmonary embolus. Her BNP is markedly elevated. She was given dose of Lasix in the ED. There is no ischemic changes on EKG and troponins negative. I consulted Dr. Malachi Bonds who is the hospitalist on call who will admit the patient. Per chart review, her hgb is at baseline.    Rolan Bucco, MD 04/03/13 1100

## 2013-04-03 NOTE — H&P (Addendum)
[facility] History and Physical  [name] ZOX:[id number] DOB: 06-Nov-1920 DOA: 04/03/2013  Referring physician:  Rolan Bucco PCP:  Lupe Carney, MD   Chief Complaint:  SOB, DOE  HPI:  The patient is a 79 y.o. year-old female with history of who presents with HTN, atrial fibrillation followed by Dr. Donnie Aho chronic systolic and diastolic heart failure with EF of 50-55% and grade 1 DD, mild mitral valve fibrosis, GERD, anxiety, and multiple admissions this year.  She was first admitted in Jan of 2014 with an SBO which improved without surgical intervention.  In August, however, she required admission for colostomy placement because of a distal rectal stricture.  Biopsies demonstrated benign tissue. Her post-operative course was complicated by MRSA peristomal abscess which was drained and treated with clindamycin.  Since that time, she has been living her granddaughter.  She exercises daily and ambulates with a walker.  A few weeks ago, she was able to walk as far as she wanted without dyspnea.  Over the last two weeks, she has noticed increased ankle swelling.  Over the last week, she has had increased sinus congestion with "multicolored" mucous and copious post-nasal drip which lead to increased cough.  She then started noticing SOB with ambulation and was only able to walk from one room to another without dyspnea.  + PND and orthopnea.  She denies chest pains or pressure, fevers, chills, nausea, vomiting, diarrhea, changes in appetite.    In the ER, bull for her initial blood pressure of 127/102 which on repeat has been consistently in the 180s over 70s, heart rate in the 50s to 60s, respirations in the mid 20s and oxygen saturations in the low 90s. With ambulation, her oxygen saturations dropped to the mid 80s. Her labs were notable for hemoglobin of 9.3, increased from August, platelets of 13K, potassium of 3, proBNP 13,219, chest x-ray which demonstrates mild CHF.  Weight today is the  same as 11/2012.  Review of Systems:  General:  Denies fevers, chills, weight loss or gain HEENT:  Denies changes to hearing and vision, rhinorrhea, sinus congestion, sore throat CV:  Denies chest pain and palpitations, lower extremity edema.  PULM:  Denies SOB, wheezing, cough.   GI:  Denies nausea, vomiting, constipation, diarrhea.   GU:  Denies dysuria, frequency, urgency ENDO:  Denies polyuria, polydipsia.   HEME:  Denies hematemesis, blood in stools, melena, abnormal bruising or bleeding.  LYMPH:  Denies lymphadenopathy.   MSK:  Denies arthralgias, myalgias.   DERM:  Denies skin rash or ulcer.   NEURO:  Denies focal numbness, weakness, slurred speech, confusion, facial droop.  PSYCH:  Denies anxiety and depression.    Past Medical History  Diagnosis Date  . Hypertension   . Irregular heart beat     atrial fib per Dr Donnie Aho OV 5/13  . GERD (gastroesophageal reflux disease)   . Anxiety   . Arthritis   . Congestive heart failure   . Hepatitis A   . Bowel obstruction 04/2012  . Chronic UTI   . Bruises easily    Past Surgical History  Procedure Laterality Date  . Cataract extraction Bilateral   . Dilatation & currettage/hysteroscopy with resectocope    . Laparoscopic diverted colostomy N/A 11/26/2012    Procedure: LAPAROSCOPIC COLOSTOMY PLACEMENT RIGID  SIGMOIDOSCOPY;  Surgeon: Romie Levee, MD;  Location: [facility];  Service: General;  Laterality: N/A;   Social History:  reports that she quit smoking about 74 years ago. Her smoking use included Cigarettes.  She smoked 0.00 packs per day for 6 years. She has never used smokeless tobacco. She reports that she does not drink alcohol or use illicit drugs. Lives with granddaughter who is Charity fundraiser at [employer].  Uses walker for ambulation.    Allergies  Allergen Reactions  . Neosporin [Neomycin-Bacitracin Zn-Polymyx]   . Other     Depression medicines: nightmares, rash, vertigo,  Unsure of rx names, has tried 2.  . Celexa [Citalopram  Hydrobromide] Rash  . Sulfa Antibiotics Rash    Family History  Problem Relation Age of Onset  . Congestive Heart Failure Mother   . Stroke Father   . Colon cancer Neg Hx      Prior to Admission medications   Medication Sig Start Date End Date Taking? Authorizing Provider  acetaminophen (TYLENOL) 500 MG tablet Take 500 mg by mouth every 6 (six) hours as needed for moderate pain.   Yes Historical Provider, MD  ALPRAZolam (XANAX) 0.25 MG tablet Take 0.125 mg by mouth 3 (three) times daily as needed for anxiety.    Yes Historical Provider, MD  atenolol (TENORMIN) 50 MG tablet Take 50 mg by mouth daily after breakfast.   Yes Historical Provider, MD  Carboxymethylcellul-Glycerin (OPTIVE) 0.5-0.9 % SOLN Apply 1 drop to eye daily as needed (for dry eyes).   Yes Historical Provider, MD  cetirizine (ZYRTEC) 10 MG tablet Take 10 mg by mouth daily.   Yes Historical Provider, MD  Ferrous Sulfate (IRON) 325 (65 FE) MG TABS Take 325 mg by mouth daily.   Yes Historical Provider, MD  folic acid (FOLVITE) 1 MG tablet Take 1 mg by mouth daily.   Yes Historical Provider, MD  potassium gluconate 595 MG TABS Take 595 mg by mouth daily.   Yes Historical Provider, MD   Physical Exam: Filed Vitals:   04/03/13 0937 04/03/13 1100 04/03/13 1215 04/03/13 1223  BP: 127/102 186/77  189/78  Pulse: 63 54  61  Temp: 97.9 F (36.6 C)   97.2 F (36.2 C)  TempSrc: Oral   Oral  Resp: 21 26  22   Height:   5\' 4"  (1.626 m)   Weight:   62.2 kg (137 lb 2 oz)   SpO2: 99% 91%  99%     General:  CF, mild tachypnea  Eyes:  PERRL, anicteric, non-injected.  ENT:  Nares clear.  OP clear, non-erythematous without plaques or exudates.  MMM.  Right frontal headache/pain  Neck:  Supple without TM or JVD.    Lymph:  No cervical, supraclavicular, or submandibular LAD.  Cardiovascular:  RRR, normal S1, S2, 1/6 systolic m. Apex.  2+ pulses, warm extremities  Respiratory:  Rales to mid-back bilaterally without rhonchi or  wheeze, + mild tachypnea.    Abdomen:  NABS.  Soft, ND/NT.    Skin:  No rashes or focal lesions.  Musculoskeletal:  Normal bulk and tone.  Trace ankle edema.  Psychiatric:  A & O x 4.  Appropriate affect.  Neurologic:  CN 3-12 intact.  5/5 strength.  Sensation intact.  Labs on Admission:  Basic Metabolic Panel:  Recent Labs Lab 04/03/13 0949  NA 141  K 3.0*  CL 105  CO2 23  GLUCOSE 126*  BUN 23  CREATININE 0.98  CALCIUM 9.1   Liver Function Tests:  Recent Labs Lab 04/03/13 0949  AST 33  ALT 20  ALKPHOS 61  BILITOT 0.7  PROT 7.7  ALBUMIN 3.8   No results found for this basename: LIPASE, AMYLASE,  in the  last 168 hours No results found for this basename: AMMONIA,  in the last 168 hours CBC:  Recent Labs Lab 04/03/13 0949  WBC 4.5  NEUTROABS 1.9  HGB 9.3*  HCT 30.2*  MCV 85.6  PLT 103*   Cardiac Enzymes:  Recent Labs Lab 04/03/13 0949  TROPONINI <0.30    BNP (last 3 results)  Recent Labs  05/11/12 0505 04/03/13 0949  PROBNP 5916.0* 13219.0*   CBG: No results found for this basename: GLUCAP,  in the last 168 hours  Radiological Exams on Admission: Dg Chest 2 View  04/03/2013   CLINICAL DATA:  Shortness of breath and weakness  EXAM: CHEST  2 VIEW  COMPARISON:  09/21/2012  FINDINGS: There is bowed cardiac enlargement. Small bilateral pleural effusions and mild interstitial edema is identified. No airspace consolidation noted.  IMPRESSION: 1. Mild CHF.   Electronically Signed   By: Signa Kell M.D.   On: 04/03/2013 10:19    EKG: pending  Assessment/Plan Principal Problem:   Acute on chronic combined systolic and diastolic heart failure Active Problems:   HTN (hypertension)   Hypokalemia   Anemia, iron deficiency   Thrombocytopenia, unspecified   Sinusitis, acute   SOB (shortness of breath)   CKD (chronic kidney disease) stage 3, GFR 30-59 ml/min  ---  Acute hypoxic respiratory failure likely secondary to acute systolic and  diastolic heart failure exacerbation, last EF 50-55%, grade 1 DD 04/2012.  Exacerbation may be secondary to recent cold or sinusitis.  Rule out ACS and thyroid disease in -  Telemetry -  Daily weights and strict I/O -  TSH -  Troponin neg -  ECHO -  Defer ACEI due to stage III/IV CKD -  Continue beta blocker -  Lasix 40 mg BID -  2 gm sodium diet with 1.2L fluid restriction -  Wean oxygen as tolerated -  Nursing staff to provide education about CHF -  Foley for strict i/o due to urinary incontinence at baseline -  Nutrition consult for low salt diet  Paroxysmal atrial fibrillation.  ChadS2vasc of 5, however, she has problems with chronic anemia, easy bruising and bleeding.  Was previously taken off ASA while critically ill earlier this year.  -  Restart low dose ASA -  telemetry  HTN with elevated BP -  Continue atenolol -  Start bidil and titrate as tolerated -  Dose of hydralazine now  Anxiety, stable. Continue xanax  Allergic rhinitis -  Substitute claritin -  Defer flonase due to acute sinusitis, but would start after abx complete  Acute sinusitis (symptoms going on for 2 weeks and triggered CHF flare) -  Nasal saline -  Augmentin (renally dosed) x 5 days  Iron and folate deficiency anemia, improving hemoglobin on current therapies -  Continue iron and B12 supplements  Hypokalemia, chronic and may be due to mild malnutrition.  Not on diuretic medications at baseline -  Will give KCl daily during diuresis and titrate as needed -  Magnesium level  CKD stage III/IV with CrCl around 31-33 using CG method (likely overestimate) -  Renally dose medications -  Minimize nephrotoxins  Chronic UTI with chronic bladder spasms  Diet:  Healthy heart, 2gm sodium Access:  PIV IVF:  off Proph:  lovenox  Code Status: DNR Family Communication: patient and her son Disposition Plan: Admit to telemetry  Time spent: 60 min Renae Fickle [facility] Pager  [phone]  If 7PM-7AM, please contact night-coverage [email or web address] Password [facility] 04/03/2013, 12:48 PM

## 2013-04-03 NOTE — ED Notes (Signed)
Patient c/o productive cough with "creamy " sputum and SOB. Patient's son states that the patient had an irregular heart rhythm yesterday. Patient has a history of atrial fibrillation and CHF. Patient denies any fever.

## 2013-04-03 NOTE — ED Notes (Signed)
Hospitalist in the room examining patient and unable to transport patient to 1410 at this time.

## 2013-04-04 ENCOUNTER — Inpatient Hospital Stay (HOSPITAL_COMMUNITY): Payer: Medicare Other

## 2013-04-04 DIAGNOSIS — I10 Essential (primary) hypertension: Secondary | ICD-10-CM

## 2013-04-04 DIAGNOSIS — R001 Bradycardia, unspecified: Secondary | ICD-10-CM

## 2013-04-04 DIAGNOSIS — I5033 Acute on chronic diastolic (congestive) heart failure: Secondary | ICD-10-CM

## 2013-04-04 LAB — CBC
HCT: 27.2 % — ABNORMAL LOW (ref 36.0–46.0)
MCH: 26.2 pg (ref 26.0–34.0)
MCV: 84.7 fL (ref 78.0–100.0)
RBC: 3.21 MIL/uL — ABNORMAL LOW (ref 3.87–5.11)
WBC: 5.4 10*3/uL (ref 4.0–10.5)

## 2013-04-04 LAB — BASIC METABOLIC PANEL
BUN: 22 mg/dL (ref 6–23)
CO2: 26 mEq/L (ref 19–32)
Creatinine, Ser: 1.03 mg/dL (ref 0.50–1.10)
GFR calc Af Amer: 54 mL/min — ABNORMAL LOW (ref >90)
Glucose, Bld: 97 mg/dL (ref 70–99)
Potassium: 3 mEq/L — ABNORMAL LOW (ref 3.5–5.1)
Sodium: 141 mEq/L (ref 135–145)

## 2013-04-04 MED ORDER — ENOXAPARIN SODIUM 40 MG/0.4ML ~~LOC~~ SOLN
40.0000 mg | SUBCUTANEOUS | Status: DC
  Administered 2013-04-04 – 2013-04-05 (×2): 40 mg via SUBCUTANEOUS
  Filled 2013-04-04 (×3): qty 0.4

## 2013-04-04 MED ORDER — MAGNESIUM SULFATE 40 MG/ML IJ SOLN
2.0000 g | Freq: Once | INTRAMUSCULAR | Status: AC
  Administered 2013-04-04: 07:00:00 2 g via INTRAVENOUS
  Filled 2013-04-04: qty 50

## 2013-04-04 MED ORDER — POTASSIUM CHLORIDE CRYS ER 20 MEQ PO TBCR
40.0000 meq | EXTENDED_RELEASE_TABLET | Freq: Two times a day (BID) | ORAL | Status: DC
  Administered 2013-04-04 – 2013-04-06 (×5): 40 meq via ORAL
  Filled 2013-04-04 (×6): qty 2

## 2013-04-04 MED ORDER — FUROSEMIDE 10 MG/ML IJ SOLN
40.0000 mg | Freq: Every day | INTRAMUSCULAR | Status: DC
  Administered 2013-04-04: 40 mg via INTRAVENOUS
  Filled 2013-04-04 (×2): qty 4

## 2013-04-04 MED ORDER — ATENOLOL 25 MG PO TABS
25.0000 mg | ORAL_TABLET | Freq: Every day | ORAL | Status: DC
  Administered 2013-04-05 – 2013-04-06 (×2): 25 mg via ORAL
  Filled 2013-04-04 (×2): qty 1

## 2013-04-04 NOTE — Progress Notes (Addendum)
[facility] PROGRESS NOTE  [name] WUJ:[id number] DOB: 1921/01/30 DOA: 04/03/2013 PCP: Lupe Carney, MD  Assessment/Plan , Acute hypoxic respiratory failure likely secondary to acute diastolic heart failure exacerbation which is likely worsened by CKD and high salt diet.  Breathing improved subjectively, but CXR looks stable to worse interstitial edema and effusions.   - Telemetry:  bradycardia - Weight 62kg > 58 kg -  -5.4 L - TSH  wnl  - Troponin neg  - ECHO:  Preserved EF, some focal LVH, moderate TR, right atrial mild to moderate dilation, mild increase PA peak pressure - Defer ACEI due to stage III/IV CKD  - Continue beta blocker  - Lasix 40 mg IV once today - 2 gm sodium diet with 1.2L fluid restriction  - Nursing staff to provide education about CHF  - Foley for strict i/o due to urinary incontinence at baseline  - Nutrition consult for low salt diet   Paroxysmal atrial fibrillation. ChadS2vasc of 5, however, she has problems with chronic anemia, easy bruising and bleeding. Was previously taken off ASA while critically ill earlier this year.  Bradycardic - Restart low dose ASA  - reduce atenolol   HTN with labile BP  - reduce atenolol  - d/c bidil  - will be starting home lasix -  Consider norvasc  Anxiety, stable. Continue xanax  Allergic rhinitis  - Substitute claritin  - Defer flonase due to acute sinusitis, but would start after abx complete   Acute sinusitis (symptoms going on for 2 weeks and triggered CHF flare)  - Nasal saline  - Augmentin (renally dosed) x 5 days , day #2  Iron and folate deficiency anemia, improving hemoglobin on current therapies  - Continue iron and B12 supplements   Hypokalemia, chronic and may be due to mild malnutrition. Not on diuretic medications at baseline  - Will give KCl daily during diuresis and titrate as needed  - Magnesium level wnl  CKD stage III/IV with CrCl around 31-33 using CG method  (likely overestimate)  - Renally dose medications  - Minimize nephrotoxins   Chronic UTI with chronic bladder spasms   Thrombocytopenia, approximately stable yesterday to today, but schistocytes on smear -  Check fibrinogen, PTT, INR  Diet: Healthy heart, 2gm sodium  Access: PIV  IVF: off  Proph: lovenox   Code Status: DNR  Family Communication: patient and her granddaughter  Disposition Plan:  Continue telemetry   Consultants:  None  Procedures:  ECHO  CXR  Antibiotics:  none   HPI/Subjective:  SOB improving.  Good appetite.  Diuresed well yesterday.      Objective: Filed Vitals:   04/03/13 2120 04/03/13 2206 04/03/13 2328 04/04/13 0559  BP: 117/39 140/65 125/46 142/50  Pulse: 64   62  Temp: 98.1 F (36.7 C)   98.3 F (36.8 C)  TempSrc: Oral   Oral  Resp: 20   20  Height:      Weight:    58.3 kg (128 lb 8.5 oz)  SpO2: 96%   95%    Intake/Output Summary (Last 24 hours) at 04/04/13 1243 Last data filed at 04/04/13 1228  Gross per 24 hour  Intake    820 ml  Output   5802 ml  Net  -4982 ml   Filed Weights   04/03/13 1215 04/04/13 0559  Weight: 62.2 kg (137 lb 2 oz) 58.3 kg (128 lb 8.5 oz)    Exam:   General:  CF, No acute distress  HEENT:  NCAT, MMM  Cardiovascular:  RRR, nl S1, S2 no mrg, 2+ pulses, warm extremities  Respiratory:  Diminished bilateral BS with dramatically reduced rales, no rhonchi or wheeze, no increased WOB  Abdomen:   NABS, soft, NT/ND  MSK:   Normal tone and bulk, no LEE  Neuro:  Grossly intact  Data Reviewed: Basic Metabolic Panel:  Recent Labs Lab 04/03/13 0949 04/04/13 0445  NA 141 141  K 3.0* 3.0*  CL 105 104  CO2 23 26  GLUCOSE 126* 97  BUN 23 22  CREATININE 0.98 1.03  CALCIUM 9.1 8.9  MG 2.2  --    Liver Function Tests:  Recent Labs Lab 04/03/13 0949  AST 33  ALT 20  ALKPHOS 61  BILITOT 0.7  PROT 7.7  ALBUMIN 3.8   No results found for this basename: LIPASE, AMYLASE,  in the last 168  hours No results found for this basename: AMMONIA,  in the last 168 hours CBC:  Recent Labs Lab 04/03/13 0949 04/04/13 0445  WBC 4.5 5.4  NEUTROABS 1.9  --   HGB 9.3* 8.4*  HCT 30.2* 27.2*  MCV 85.6 84.7  PLT 103* 98*   Cardiac Enzymes:  Recent Labs Lab 04/03/13 0949  TROPONINI <0.30   BNP (last 3 results)  Recent Labs  05/11/12 0505 04/03/13 0949  PROBNP 5916.0* 13219.0*   CBG: No results found for this basename: GLUCAP,  in the last 168 hours  Recent Results (from the past 240 hour(s))  MRSA PCR SCREENING     Status: None   Collection Time    04/03/13 12:47 PM      Result Value Range Status   MRSA by PCR NEGATIVE  NEGATIVE Final   Comment:            The GeneXpert MRSA Assay (FDA     approved for NASAL specimens     only), is one component of a     comprehensive MRSA colonization     surveillance program. It is not     intended to diagnose MRSA     infection nor to guide or     monitor treatment for     MRSA infections.     Studies: Dg Chest 2 View  04/03/2013   CLINICAL DATA:  Shortness of breath and weakness  EXAM: CHEST  2 VIEW  COMPARISON:  09/21/2012  FINDINGS: There is bowed cardiac enlargement. Small bilateral pleural effusions and mild interstitial edema is identified. No airspace consolidation noted.  IMPRESSION: 1. Mild CHF.   Electronically Signed   By: Signa Kell M.D.   On: 04/03/2013 10:19   Dg Chest Port 1 View  04/04/2013   CLINICAL DATA:  CHF.  Diuresis.  EXAM: PORTABLE CHEST - 1 VIEW  COMPARISON:  04/03/2013  FINDINGS: The cardiac silhouette remains enlarged, unchanged. Atherosclerotic aortic calcifications are noted. The current examination is obtained with a greater degree of lung inflation. Diffusely increased interstitial markings throughout both lungs are mildly more prominent than on the prior study. There likely remain small bilateral pleural effusions. No acute osseous abnormality is identified.  IMPRESSION: Persistent  interstitial edema, mildly increased from the prior study. Likely persistent small bilateral pleural effusions.   Electronically Signed   By: Sebastian Ache   On: 04/04/2013 07:22    Scheduled Meds: . amoxicillin-clavulanate  1 tablet Oral BID  . [START ON 04/05/2013] atenolol  25 mg Oral Daily  . enoxaparin (LOVENOX) injection  30 mg Subcutaneous Q24H  . ferrous  sulfate  325 mg Oral Q breakfast  . folic acid  1 mg Oral Daily  . furosemide  40 mg Intravenous Daily  . loratadine  10 mg Oral Daily  . oxymetazoline  1 spray Each Nare BID  . potassium chloride  40 mEq Oral BID  . sodium chloride  3 mL Intravenous Q12H   Continuous Infusions:   Principal Problem:   Acute on chronic combined systolic and diastolic heart failure Active Problems:   HTN (hypertension)   Hypokalemia   Anemia, iron deficiency   Thrombocytopenia, unspecified   Sinusitis, acute   SOB (shortness of breath)   CKD (chronic kidney disease) stage 3, GFR 30-59 ml/min    Time spent: 30 min    Miaa Latterell  [facility] Pager [phone]. If 7PM-7AM, please contact night-coverage at [email or web address], password [facility] 04/04/2013, 12:43 PM  LOS: 1 day

## 2013-04-04 NOTE — Progress Notes (Signed)
Nutrition Education Note  RD consulted for nutrition education regarding Low Sodium diet.  RD provided "Low Sodium Nutrition Therapy" handout from the Academy of [employer]. Reviewed patient's dietary recall. Provided examples on ways to decrease sodium intake in diet. Discouraged intake of processed foods and use of salt shaker. Pt reports eating a lot of processed/frozen foods PTA and admits to use of salt shaker. Encouraged fresh fruits and vegetables as well as whole grain sources of carbohydrates to maximize fiber intake.   RD discussed why it is important for patient to adhere to diet recommendations, and emphasized the role of fluids, foods to avoid, and importance of weighing self daily. Teach back method used.  Expect good compliance.  Body mass index is 22.05 kg/(m^2). Pt meets criteria for Normal Weight based on current BMI. Pt reports that she usually weighs 130 lbs but, gained up to 140 lbs due to fluid accumulation.   Current diet order is Heart Healthy, patient is consuming approximately 50-100% of meals at this time. Labs and medications reviewed. No further nutrition interventions warranted at this time. RD contact information provided. If additional nutrition issues arise, please re-consult RD.   Ian Malkin RD, LDN Inpatient Clinical Dietitian Pager: [phone] After Hours Pager: [phone]

## 2013-04-05 DIAGNOSIS — E876 Hypokalemia: Secondary | ICD-10-CM

## 2013-04-05 LAB — PROTIME-INR
INR: 1.23 (ref 0.00–1.49)
Prothrombin Time: 15.2 seconds (ref 11.6–15.2)

## 2013-04-05 LAB — CBC
HCT: 30.7 % — ABNORMAL LOW (ref 36.0–46.0)
Hemoglobin: 9.2 g/dL — ABNORMAL LOW (ref 12.0–15.0)
MCH: 25.7 pg — ABNORMAL LOW (ref 26.0–34.0)
MCHC: 30 g/dL (ref 30.0–36.0)
MCV: 85.8 fL (ref 78.0–100.0)
RDW: 19.9 % — ABNORMAL HIGH (ref 11.5–15.5)
WBC: 5 10*3/uL (ref 4.0–10.5)

## 2013-04-05 LAB — BASIC METABOLIC PANEL
BUN: 17 mg/dL (ref 6–23)
CO2: 24 mEq/L (ref 19–32)
Chloride: 104 mEq/L (ref 96–112)
Creatinine, Ser: 0.99 mg/dL (ref 0.50–1.10)
Glucose, Bld: 143 mg/dL — ABNORMAL HIGH (ref 70–99)

## 2013-04-05 MED ORDER — FUROSEMIDE 10 MG/ML IJ SOLN: 40.0000 mg | Freq: Two times a day (BID) | INTRAMUSCULAR | Status: DC

## 2013-04-05 MED ORDER — AMLODIPINE BESYLATE 5 MG PO TABS
5.0000 mg | ORAL_TABLET | Freq: Every day | ORAL | Status: DC
  Administered 2013-04-05 – 2013-04-06 (×2): 5 mg via ORAL
  Filled 2013-04-05 (×2): qty 1

## 2013-04-05 MED ORDER — FUROSEMIDE 10 MG/ML IJ SOLN
40.0000 mg | Freq: Two times a day (BID) | INTRAMUSCULAR | Status: DC
  Administered 2013-04-05 (×2): 40 mg via INTRAVENOUS
  Filled 2013-04-05 (×2): qty 4

## 2013-04-05 MED ORDER — FUROSEMIDE 20 MG PO TABS
20.0000 mg | ORAL_TABLET | Freq: Every day | ORAL | Status: DC
  Administered 2013-04-06: 11:00:00 20 mg via ORAL
  Filled 2013-04-05: qty 1

## 2013-04-05 NOTE — Progress Notes (Signed)
Ambulating on room air, sats were 96%, no SOB but did c/o some dizziness.

## 2013-04-05 NOTE — Progress Notes (Signed)
Still has rales at bases but would really like to be discharged today if possible.  Stable on room air but has only been OOB to chair.   Give dose of lasix this AM and second dose this afternoon. PT to assess gait Check pulse ox with ambulation If rales clear and doing well, may d/c foley this afternoon and discharge to home

## 2013-04-05 NOTE — Evaluation (Signed)
Physical Therapy Evaluation Patient Details Name: [name] MRN: [id number] DOB: [birth year] Today's Date: 04/05/2013 Time: 3295-1884 PT Time Calculation (min): 30 min  PT Assessment / Plan / Recommendation History of Present Illness  79 yo female admitted with SOB, heart failure. Pt lives alone most of the time, Mod I with ADLs/mobility  Clinical Impression  On eval, pt required Min guard assist for mobility-only able to walk ~55 feet with walker. Demonstrates general weakness,decreased activity tolerance and pt c/o dizziness throughout ambulation. O2 sats were 96% on RA during ambulation. Pt is home alone a fair amount of time. Would be concerned with pt discharging home alone on today-somewhat at risk for falling with weakness and dizziness. Pt states she could possibly d/c home tomorrow with her granddaughter. Recommend HHPT and home health aide, if possible, to assist pt when granddaughter is not available. Recommend nursing ambulate with pt 1-2 more times today so pt can mobilize more.     PT Assessment  Patient needs continued PT services    Follow Up Recommendations  Home health PT;Supervision for mobility/OOB (may benefit from home health aide)    Does the patient have the potential to tolerate intense rehabilitation      Barriers to Discharge        Equipment Recommendations  None recommended by PT    Recommendations for Other Services OT consult   Frequency Min 3X/week    Precautions / Restrictions Precautions Precautions: Fall Precaution Comments: pt c/o dizziness Restrictions Weight Bearing Restrictions: No   Pertinent Vitals/Pain At rest: 148/61, 98% RA, 66 bpm  Sitting: 165/65 End of session (after walk): 147/47, 96% RA, 72 bpm      Mobility  Bed Mobility Bed Mobility: Supine to Sit;Sit to Supine Supine to Sit: 6: Modified independent (Device/Increase time) Sit to Supine: 6: Modified independent (Device/Increase time) Transfers Transfers: Sit to  Stand;Stand to Sit Sit to Stand: 4: Min guard;From bed Stand to Sit: 4: Min guard;To bed Details for Transfer Assistance: VCs safety, hand placement Ambulation/Gait Ambulation/Gait Assistance: 4: Min guard Ambulation Distance (Feet): 55 Feet Assistive device: Rolling walker Ambulation/Gait Assistance Details: close guard for safety. slow gait speed. dyspnea 2/4. pt fatigues easily.  Gait Pattern: Step-through pattern;Decreased stride length    Exercises     PT Diagnosis: Difficulty walking;Generalized weakness  PT Problem List: Decreased strength;Decreased activity tolerance;Decreased balance;Decreased mobility PT Treatment Interventions: DME instruction;Gait training;Functional mobility training;Therapeutic activities;Therapeutic exercise;Patient/family education;Balance training     PT Goals(Current goals can be found in the care plan section) Acute Rehab PT Goals Patient Stated Goal: home soon PT Goal Formulation: With patient Time For Goal Achievement: 04/19/13 Potential to Achieve Goals: Good  Visit Information  Last PT Received On: 04/05/13 Assistance Needed: +1 History of Present Illness: 79 yo female admitted with SOB, heart failure. Pt lives alone most of the time, Mod I with ADLs/mobility       Prior Functioning  Home Living Family/patient expects to be discharged to:: Private residence Living Arrangements: Other relatives (granddaughter Cristy works 3x/week 12 hours) Available Help at Discharge: Family Type of Home: House Home Access: Stairs to enter Secretary/administrator of Steps: 1 small step from porch to [employer] Layout: Two level;Able to live on main level with bedroom/bathroom Home Equipment: Dan Humphreys - 2 wheels;Walker - 4 wheels;Bedside commode;Wheelchair - manual Prior Function Level of Independence: Needs assistance ADL's / Homemaking Assistance Needed: help with meals. Cristy helps pt into shower, otherwise pt sponge  bathes Communication Communication: [facility]  Cognition  Cognition Arousal/Alertness: Awake/alert Behavior During Therapy: WFL for tasks assessed/performed Overall Cognitive Status: Within Functional Limits for tasks assessed    Extremity/Trunk Assessment Upper Extremity Assessment Upper Extremity Assessment: Overall WFL for tasks assessed Lower Extremity Assessment Lower Extremity Assessment: Generalized weakness Cervical / Trunk Assessment Cervical / Trunk Assessment: Normal   Balance Balance Balance Assessed: Yes Dynamic Standing Balance Dynamic Standing - Balance Support: Bilateral upper extremity supported Dynamic Standing - Level of Assistance: 5: Stand by assistance  End of Session PT - End of Session Equipment Utilized During Treatment: Gait belt Activity Tolerance: Patient limited by fatigue Patient left: in bed;with call bell/phone within reach Nurse Communication: Mobility status  GP     Rebeca Alert, MPT Pager: [phone]

## 2013-04-05 NOTE — Progress Notes (Addendum)
[facility] PROGRESS NOTE  [name] ION:[id number] DOB: [birth year] DOA: 04/03/2013 PCP: Lupe Carney, MD  The patient is a 79 y.o. year-old female with history of HTN, atrial fibrillation, chronic systolic and diastolic heart failure with EF of 50-55% and grade 1 DD, mild mitral valve fibrosis, GERD, anxiety, and multiple admissions this year. She was first admitted in Jan of 2014 with an SBO which improved without surgical intervention. In August, however, she required admission for colostomy placement because of a distal rectal stricture. Biopsies demonstrated benign tissue. Her post-operative course was complicated by MRSA peristomal abscess which was drained and treated with clindamycin. Since that time, she has been living her granddaughter who is a Engineer, civil (consulting) at [employer].  Over the last two weeks, she noticed increased ankle swelling, DOE, PND, orthopnea. She has also had increased sinus congestion with "multicolored" mucous.  She was admitted and has diuresed about 9.5L.  Plan for discharge on Wed afternoon when her granddaughter is able to pick her up.      Assessment/Plan  Acute hypoxic respiratory failure likely secondary to acute diastolic heart failure exacerbation which is likely worsened by CKD and high salt diet.  Breathing improved, BNP trending down. - Telemetry:  HR in 60s - Weight 62kg > 58 >> 59.7 kg -  -681 mL yest - TSH  wnl  - Troponin neg  - ECHO:  Preserved EF, some focal LVH, moderate TR, right atrial mild to moderate dilation, mild increase PA peak pressure - Defer ACEI due to stage III/IV CKD  - Continue beta blocker  - Increase to Lasix 40 mg IV BID today - 2 gm sodium diet with 1.2L fluid restriction  - Nursing staff to provide education about CHF  - d/c foley in AM - appreciate Nutrition assistance  Paroxysmal atrial fibrillation. ChadS2vasc of 5, however, she has problems with chronic anemia, easy bruising and bleeding. Was previously taken off ASA  while critically ill earlier this year.  Bradycardia resolved with decreased atenolol - continue ASA and atenolol at current dose  HTN with labile BP  - continue atenolol  -  Start norvasc  Anxiety, stable. Continue xanax   Allergic rhinitis  - Substitute claritin  - Start flonase after abx complete   Acute sinusitis (symptoms going on for 2 weeks and triggered CHF flare)  - Nasal saline  - Augmentin (renally dosed) x 5 days , day #3  Iron and folate deficiency anemia, improving hemoglobin on current therapies  - Continue iron and B12 supplements   Hypokalemia, chronic and may be due to mild malnutrition. Not on diuretic medications at baseline  - Will give KCl daily during diuresis and titrate as needed  - Magnesium level wnl  CKD stage III/IV with CrCl around 31-33 using CG method (likely overestimate)  - Renally dose medications  - Minimize nephrotoxins   Chronic UTI with chronic bladder spasms   Thrombocytopenia, approximately stable yesterday to today, but schistocytes on smear -  Fibrinogen, PTT, INR wnl  Diet: Healthy heart, 2gm sodium  Access: PIV  IVF: off  Proph: lovenox   Code Status: DNR  Family Communication: patient and her granddaughter  Disposition Plan:  Continue telemetry   Consultants:  None  Procedures:  ECHO  CXR  Antibiotics:  none   HPI/Subjective:  SOB improving. Diuresed less well yesterday.  Had some lightheadedness with ambulation, however, this appears to be improving with diuresis.      Objective: Filed Vitals:   04/04/13 2053 04/05/13  0018 04/05/13 0424 04/05/13 1433  BP: 129/54  163/63 133/51  Pulse: 68  65 66  Temp: 99 F (37.2 C) 98 F (36.7 C) 98.3 F (36.8 C) 98.3 F (36.8 C)  TempSrc: Oral Oral Oral Oral  Resp: 19  19 20   Height:      Weight:   59.7 kg (131 lb 9.8 oz)   SpO2: 98%  96% 96%    Intake/Output Summary (Last 24 hours) at 04/05/13 1502 Last data filed at 04/05/13 1249  Gross per 24 hour   Intake   1120 ml  Output   3202 ml  Net  -2082 ml   Filed Weights   04/03/13 1215 04/04/13 0559 04/05/13 0424  Weight: 62.2 kg (137 lb 2 oz) 58.3 kg (128 lb 8.5 oz) 59.7 kg (131 lb 9.8 oz)    Exam:   General:  CF, No acute distress  HEENT:  NCAT, MMM  Cardiovascular:  RRR, nl S1, S2 no mrg, 2+ pulses, warm extremities  Respiratory:  Course bilateral rales at the bases this morning, then rare rales at bases this afternoon, no rhonchi or wheeze, no increased WOB  Abdomen:   NABS, soft, NT/ND  MSK:   Normal tone and bulk, no LEE  Neuro:  Grossly intact  Data Reviewed: Basic Metabolic Panel:  Recent Labs Lab 04/03/13 0949 04/04/13 0445 04/05/13 0537  NA 141 141 140  K 3.0* 3.0* 3.4*  CL 105 104 104  CO2 23 26 24   GLUCOSE 126* 97 143*  BUN 23 22 17   CREATININE 0.98 1.03 0.99  CALCIUM 9.1 8.9 8.6  MG 2.2  --   --    Liver Function Tests:  Recent Labs Lab 04/03/13 0949  AST 33  ALT 20  ALKPHOS 61  BILITOT 0.7  PROT 7.7  ALBUMIN 3.8   No results found for this basename: LIPASE, AMYLASE,  in the last 168 hours No results found for this basename: AMMONIA,  in the last 168 hours CBC:  Recent Labs Lab 04/03/13 0949 04/04/13 0445 04/05/13 0537  WBC 4.5 5.4 5.0  NEUTROABS 1.9  --   --   HGB 9.3* 8.4* 9.2*  HCT 30.2* 27.2* 30.7*  MCV 85.6 84.7 85.8  PLT 103* 98* 104*   Cardiac Enzymes:  Recent Labs Lab 04/03/13 0949  TROPONINI <0.30   BNP (last 3 results)  Recent Labs  05/11/12 0505 04/03/13 0949 04/05/13 0500  PROBNP 5916.0* 13219.0* 3467.0*   CBG: No results found for this basename: GLUCAP,  in the last 168 hours  Recent Results (from the past 240 hour(s))  MRSA PCR SCREENING     Status: None   Collection Time    04/03/13 12:47 PM      Result Value Range Status   MRSA by PCR NEGATIVE  NEGATIVE Final   Comment:            The GeneXpert MRSA Assay (FDA     approved for NASAL specimens     only), is one component of a      comprehensive MRSA colonization     surveillance program. It is not     intended to diagnose MRSA     infection nor to guide or     monitor treatment for     MRSA infections.     Studies: Dg Chest Port 1 View  04/04/2013   CLINICAL DATA:  CHF.  Diuresis.  EXAM: PORTABLE CHEST - 1 VIEW  COMPARISON:  04/03/2013  FINDINGS:  The cardiac silhouette remains enlarged, unchanged. Atherosclerotic aortic calcifications are noted. The current examination is obtained with a greater degree of lung inflation. Diffusely increased interstitial markings throughout both lungs are mildly more prominent than on the prior study. There likely remain small bilateral pleural effusions. No acute osseous abnormality is identified.  IMPRESSION: Persistent interstitial edema, mildly increased from the prior study. Likely persistent small bilateral pleural effusions.   Electronically Signed   By: Sebastian Ache   On: 04/04/2013 07:22    Scheduled Meds: . amLODipine  5 mg Oral Daily  . amoxicillin-clavulanate  1 tablet Oral BID  . atenolol  25 mg Oral Daily  . enoxaparin (LOVENOX) injection  40 mg Subcutaneous Q24H  . ferrous sulfate  325 mg Oral Q breakfast  . folic acid  1 mg Oral Daily  . furosemide  40 mg Intravenous BID  . loratadine  10 mg Oral Daily  . oxymetazoline  1 spray Each Nare BID  . potassium chloride  40 mEq Oral BID  . sodium chloride  3 mL Intravenous Q12H   Continuous Infusions:   Principal Problem:   Acute on chronic diastolic heart failure Active Problems:   HTN (hypertension)   Hypokalemia   Anemia, iron deficiency   Thrombocytopenia, unspecified   Sinusitis, acute   SOB (shortness of breath)   CKD (chronic kidney disease) stage 3, GFR 30-59 ml/min   Chronic kidney disease (CKD), stage III (moderate)   Bradycardia    Time spent: 30 min    Devantae Babe  [facility] Pager [phone]. If 7PM-7AM, please contact night-coverage at [email or web address], password [facility] 04/05/2013,  3:02 PM  LOS: 2 days

## 2013-04-06 DIAGNOSIS — D509 Iron deficiency anemia, unspecified: Secondary | ICD-10-CM

## 2013-04-06 LAB — CBC
MCV: 84.1 fL (ref 78.0–100.0)
Platelets: 125 10*3/uL — ABNORMAL LOW (ref 150–400)
RBC: 4.09 MIL/uL (ref 3.87–5.11)
WBC: 5.7 10*3/uL (ref 4.0–10.5)

## 2013-04-06 LAB — BASIC METABOLIC PANEL
CO2: 25 mEq/L (ref 19–32)
Calcium: 9.1 mg/dL (ref 8.4–10.5)
Chloride: 97 mEq/L (ref 96–112)
Sodium: 134 mEq/L — ABNORMAL LOW (ref 135–145)

## 2013-04-06 MED ORDER — FUROSEMIDE 20 MG PO TABS: 20.0000 mg | ORAL_TABLET | Freq: Every day | ORAL | Status: AC

## 2013-04-06 MED ORDER — AMLODIPINE BESYLATE 5 MG PO TABS: 5.0000 mg | ORAL_TABLET | Freq: Every day | ORAL | Status: AC

## 2013-04-06 MED ORDER — AMOXICILLIN-POT CLAVULANATE 500-125 MG PO TABS
1.0000 | ORAL_TABLET | Freq: Two times a day (BID) | ORAL | Status: DC
Start: 2013-04-06 — End: 2014-03-19

## 2013-04-06 MED ORDER — ATENOLOL 25 MG PO TABS: 25.0000 mg | ORAL_TABLET | Freq: Every day | ORAL | Status: AC

## 2013-04-06 MED ORDER — ALPRAZOLAM 0.25 MG PO TABS: 0.1250 mg | ORAL_TABLET | Freq: Three times a day (TID) | ORAL | Status: DC | PRN

## 2013-04-06 NOTE — Discharge Summary (Signed)
Physician Discharge Summary  [name] FAO:[id number] DOB: November 25, 1920 DOA: 04/03/2013  PCP: Lupe Carney, MD  Admit date: 04/03/2013 Discharge date: 04/06/2013  Recommendations for Outpatient Follow-up:  1. Pt will need to follow up with PCP in 2-3 weeks post discharge 2. Please obtain BMP to evaluate electrolytes and kidney function 3. Please also check CBC to evaluate Hg and Hct levels 4. Please note that weight on discharge is 123 lbs  5. Continue Augmentin for 2 more days post discharge  6. Please note that Norvasc was started for better BP control   Discharge Diagnoses: Acute on chronic systolic and diastolic CHF Principal Problem:   Acute on chronic diastolic heart failure Active Problems:   HTN (hypertension)   Hypokalemia   Anemia, iron deficiency   Thrombocytopenia, unspecified   Sinusitis, acute   SOB (shortness of breath)   CKD (chronic kidney disease) stage 3, GFR 30-59 ml/min   Chronic kidney disease (CKD), stage III (moderate)   Bradycardia  Discharge Condition: Stable  Diet recommendation: Heart healthy diet discussed in details   History of present illness:  The patient is a 79 y.o. year-old female with history of HTN, atrial fibrillation, chronic systolic and diastolic heart failure with EF of 50-55% and grade 1 DD, mild mitral valve fibrosis, GERD, anxiety, and multiple admissions this year. She was first admitted in Jan of 2014 with an SBO which improved without surgical intervention. In August, however, she required admission for colostomy placement because of a distal rectal stricture. Biopsies demonstrated benign tissue. Her post-operative course was complicated by MRSA peristomal abscess which was drained and treated with clindamycin. Since that time, she has been living her granddaughter who is a Engineer, civil (consulting) at [employer]. Over the last two weeks, she noticed increased ankle swelling, DOE, PND, orthopnea. She has also had increased sinus congestion with  "multicolored" mucous. She was admitted and has diuresed about 9.5L while inpatient.   Assessment/Plan  Acute hypoxic respiratory failure likely secondary to acute diastolic heart failure exacerbation  - clinically improved and appears to be at baseline - BNP trending down and weight is stable at 123 lbs - Telemetry: HR in 60s  - TSH wnl  - Troponin neg  - ECHO: Preserved EF, some focal LVH, moderate TR, right atrial mild to moderate dilation, mild increase PA peak pressure  - Defer ACEI due to stage III/IV CKD  - Continue beta blocker  - 2 gm sodium diet with 1.2L fluid restriction  Paroxysmal atrial fibrillation.  - ChadS2vasc of 5, - not anticoag candidate to anemia and propensity for bleeding - continue ASA and atenolol at current dose  HTN with labile BP  - continue atenolol, norvasc  Acute sinusitis (symptoms going on for 2 weeks and triggered CHF flare)  - Nasal saline  - Augmentin (renally dosed) x 5 days , day #4/5  Iron and folate deficiency anemia - improving hemoglobin on current therapies  - Continue iron and B12 supplements  Hypokalemia - secondary to Lasix - within normal limits this AM - Magnesium level wnl  CKD stage III/IV - Renally dose medications  - Cr is within normal limits  - Minimize nephrotoxins  Chronic UTI  - with chronic bladder spasms  Thrombocytopenia - stable overall - Fibrinogen, PTT, INR wnl   Code Status: DNR  Family Communication: patient and her granddaughter   Procedures/Studies: Dg Chest 2 View   04/03/2013  Mild CHF.    Dg Chest Port 1 View   04/04/2013  Persistent interstitial  edema, mildly increased from the prior study. Likely persistent small bilateral pleural effusions.    Consultations:  None  Antibiotics:  Augmentin   Discharge Exam: Filed Vitals:   04/06/13 0650  BP: 165/60  Pulse: 72  Temp: 98 F (36.7 C)  Resp: 20   Filed Vitals:   04/05/13 0424 04/05/13 1433 04/05/13 2103 04/06/13 0650  BP:  163/63 133/51 139/76 165/60  Pulse: 65 66 70 72  Temp: 98.3 F (36.8 C) 98.3 F (36.8 C) 98.4 F (36.9 C) 98 F (36.7 C)  TempSrc: Oral Oral Oral Oral  Resp: 19 20 22 20   Height:      Weight: 59.7 kg (131 lb 9.8 oz)   56.019 kg (123 lb 8 oz)  SpO2: 96% 96% 95% 98%    General: Pt is alert, follows commands appropriately, not in acute distress Cardiovascular: Regular rate and rhythm, S1/S2 +, no murmurs, no rubs, no gallops Respiratory: Clear to auscultation bilaterally, no wheezing, no crackles, no rhonchi Abdominal: Soft, non tender, non distended, bowel sounds +, no guarding Extremities: no edema, no cyanosis, pulses palpable bilaterally DP and PT Neuro: Grossly nonfocal  Discharge Instructions  Discharge Orders   Future Orders Complete By Expires   Diet - low sodium heart healthy  As directed    Increase activity slowly  As directed        Medication List         acetaminophen 500 MG tablet  Commonly known as:  TYLENOL  Take 500 mg by mouth every 6 (six) hours as needed for moderate pain.     ALPRAZolam 0.25 MG tablet  Commonly known as:  XANAX  Take 0.5 tablets (0.125 mg total) by mouth 3 (three) times daily as needed for anxiety.     amLODipine 5 MG tablet  Commonly known as:  NORVASC  Take 1 tablet (5 mg total) by mouth daily.     amoxicillin-clavulanate 500-125 MG per tablet  Commonly known as:  AUGMENTIN  Take 1 tablet (500 mg total) by mouth 2 (two) times daily.     atenolol 25 MG tablet  Commonly known as:  TENORMIN  Take 1 tablet (25 mg total) by mouth daily after breakfast.     cetirizine 10 MG tablet  Commonly known as:  ZYRTEC  Take 10 mg by mouth daily.     folic acid 1 MG tablet  Commonly known as:  FOLVITE  Take 1 mg by mouth daily.     furosemide 20 MG tablet  Commonly known as:  LASIX  Take 1 tablet (20 mg total) by mouth daily.     Iron 325 (65 FE) MG Tabs  Take 325 mg by mouth daily.     OPTIVE 0.5-0.9 % Soln  Generic drug:   Carboxymethylcellul-Glycerin  Apply 1 drop to eye daily as needed (for dry eyes).     potassium gluconate 595 MG Tabs tablet  Take 595 mg by mouth daily.           Follow-up Information   Follow up with [facility], MD In 2 weeks.   Specialty:  Family Medicine   Contact information:   [address] [place] [address] [phone]       Follow up with Debbora Presto, MD. (As needed if symptoms worsen call my cell phone [phone])    Specialty:  Internal Medicine   Contact information:   [address] [place] [address] [phone]  The results of significant diagnostics from this hospitalization (including imaging, microbiology, ancillary and laboratory) are listed below for reference.     Microbiology: Recent Results (from the past 240 hour(s))  MRSA PCR SCREENING     Status: None   Collection Time    04/03/13 12:47 PM      Result Value Range Status   MRSA by PCR NEGATIVE  NEGATIVE Final   Comment:            The GeneXpert MRSA Assay (FDA     approved for NASAL specimens     only), is one component of a     comprehensive MRSA colonization     surveillance program. It is not     intended to diagnose MRSA     infection nor to guide or     monitor treatment for     MRSA infections.     Labs: Basic Metabolic Panel:  Recent Labs Lab 04/03/13 0949 04/04/13 0445 04/05/13 0537 04/06/13 0553  NA 141 141 140 134*  K 3.0* 3.0* 3.4* 3.9  CL 105 104 104 97  CO2 23 26 24 25   GLUCOSE 126* 97 143* 109*  BUN 23 22 17  25*  CREATININE 0.98 1.03 0.99 1.02  CALCIUM 9.1 8.9 8.6 9.1  MG 2.2  --   --   --    Liver Function Tests:  Recent Labs Lab 04/03/13 0949  AST 33  ALT 20  ALKPHOS 61  BILITOT 0.7  PROT 7.7  ALBUMIN 3.8   CBC:  Recent Labs Lab 04/03/13 0949 04/04/13 0445 04/05/13 0537 04/06/13 0553  WBC 4.5 5.4 5.0 5.7  NEUTROABS 1.9  --   --   --   HGB 9.3* 8.4* 9.2* 10.7*  HCT 30.2* 27.2* 30.7* 34.4*   MCV 85.6 84.7 85.8 84.1  PLT 103* 98* 104* 125*   Cardiac Enzymes:  Recent Labs Lab 04/03/13 0949  TROPONINI <0.30   BNP: BNP (last 3 results)  Recent Labs  05/11/12 0505 04/03/13 0949 04/05/13 0500  PROBNP 5916.0* 13219.0* 3467.0*   SIGNED: Time coordinating discharge: Over 30 minutes  Debbora Presto, MD  [facility] 04/06/2013, 1:20 PM Pager [phone]  If 7PM-7AM, please contact night-coverage [email or web address] Password [email or web address]

## 2013-04-06 NOTE — Progress Notes (Signed)
Agree with previous RN's assessment of patient.  Will continue to monitor patient for duration of shift. 

## 2013-04-06 NOTE — Care Management Note (Addendum)
    Page 1 of 1   04/06/2013     3:24:50 PM   CARE MANAGEMENT NOTE 04/06/2013  Patient:  QUANTIA, GRULLON   Account Number:  [id number]  Date Initiated:  04/03/2013  Documentation initiated by:  Lanier Clam  Subjective/Objective Assessment:   79 y/o f admitted w/chf.     Action/Plan:   From home.   Anticipated DC Date:  04/06/2013   Anticipated DC Plan:  HOME W HOME HEALTH SERVICES      DC Planning Services  CM consult      Choice offered to / List presented to:  C-4 Adult Children        HH arranged  HH-2 PT  HH-3 OT      [facility] agency  [facility]   Status of service:  Completed, signed off Medicare Important Message given?   (If response is "NO", the following Medicare IM given date fields will be blank) Date Medicare IM given:   Date Additional Medicare IM given:    Discharge Disposition:  HOME W HOME HEALTH SERVICES  Per UR Regulation:  Reviewed for med. necessity/level of care/duration of stay  If discussed at Long Length of Stay Meetings, dates discussed:    Comments:  04/06/13 Nechuma Boven RN,BSN NCM [phone] PT/OT-HH.SPOKE TO CHRISTY GRANDDTR ABOUT HHC-CHOSE [facility].TC [facility] REP KRISTEN AWARE OF REFERRAL,HH ORDERS & D/C TODAY.

## 2013-04-06 NOTE — Progress Notes (Signed)
Physical Therapy Treatment Patient Details Name: [name] MRN: [id number] DOB: 1921/02/03 Today's Date: 04/06/2013 Time: 7253-6644 PT Time Calculation (min): 29 min  PT Assessment / Plan / Recommendation  History of Present Illness 79 yo female admitted with SOB, heart failure. Pt lives alone most of the time, Mod I with ADLs/mobility   PT Comments   Improved mobility and activity tolerance today. Pt still gets fatigued however. Plan is for d/c home later today with granddaughter. Pt states granddaughter does not have to work Advertising account executive. Recommend HHPT and supervision for mobility/OOB. Pt states she already has walker at home.   Follow Up Recommendations  Home health PT;Supervision for mobility/OOB (home health aide may be beneficial temporarily)     Does the patient have the potential to tolerate intense rehabilitation     Barriers to Discharge        Equipment Recommendations  None recommended by PT    Recommendations for Other Services OT consult  Frequency Min 3X/week   Progress towards PT Goals Progress towards PT goals: Progressing toward goals  Plan Current plan remains appropriate    Precautions / Restrictions Precautions Precautions: Fall Precaution Comments: pt c/o lightheadedness Restrictions Weight Bearing Restrictions: No   Pertinent Vitals/Pain Neck/L shoulder "sore"    Mobility  Bed Mobility Bed Mobility: Supine to Sit Supine to Sit: 6: Modified independent (Device/Increase time) Transfers Transfers: Sit to Stand;Stand to Sit Sit to Stand: 5: Supervision;From bed;From chair/3-in-1 Stand to Sit: 5: Supervision;To bed;To chair/3-in-1 Details for Transfer Assistance: VCs safety, hand placement Ambulation/Gait Ambulation/Gait Assistance: 4: Min guard Ambulation Distance (Feet): 135 Feet (135'x1, 100'x1) Assistive device: Rolling walker Ambulation/Gait Assistance Details: close guard for safety. slow gait speed. VCs posture. Improved tolerance today  although pt still gets fatigued. seated rest break between walks.  Gait Pattern: Step-through pattern;Decreased stride length    Exercises General Exercises - Lower Extremity Ankle Circles/Pumps: AROM;Both;20 reps;Seated Long Arc Quad: AROM;Both;10 reps;Seated Hip Flexion/Marching: AROM;Both;10 reps;Seated   PT Diagnosis:    PT Problem List:   PT Treatment Interventions:     PT Goals (current goals can now be found in the care plan section)    Visit Information  Last PT Received On: 04/06/13 Assistance Needed: +1 History of Present Illness: 79 yo female admitted with SOB, heart failure. Pt lives alone most of the time, Mod I with ADLs/mobility    Subjective Data      Cognition  Cognition Arousal/Alertness: Awake/alert Behavior During Therapy: WFL for tasks assessed/performed Overall Cognitive Status: Within Functional Limits for tasks assessed    Balance     End of Session PT - End of Session Activity Tolerance: Patient tolerated treatment well;Patient limited by fatigue Patient left: in chair;with call bell/phone within reach   GP     Rebeca Alert, MPT Pager: [phone]

## 2013-11-07 ENCOUNTER — Telehealth (INDEPENDENT_AMBULATORY_CARE_PROVIDER_SITE_OTHER): Payer: Self-pay

## 2013-11-07 NOTE — Telephone Encounter (Signed)
Called pt to reinforce that this is normal after sx. Pt verbalized understanding.

## 2013-11-07 NOTE — Telephone Encounter (Signed)
Pt s/p lap colostomy 11/26/12. Pts son states that he has noticed that she has had some dark stool(possibly old blood) on her panty liner. Pts son states that this has not been a big area but it is a daily thing. Pts son denies pt having any fevers, chills, n/v.Pt states that her stool in her colostomy bag has been normal in color.  Informed pt that at times the muscles doesn't go back to normal after sx and she may have stool, however I will send Dr Marcello Moores a message to get further recommendations.  Pt verbalized understanding and agrees with POC.

## 2013-11-07 NOTE — Telephone Encounter (Signed)
It is normal to pass some stool and mucus occasionally, especially with a loop colostomy.

## 2014-03-13 ENCOUNTER — Emergency Department (HOSPITAL_COMMUNITY): Payer: Medicare Other

## 2014-03-13 ENCOUNTER — Other Ambulatory Visit: Payer: Self-pay | Admitting: Family Medicine

## 2014-03-13 ENCOUNTER — Ambulatory Visit
Admission: RE | Admit: 2014-03-13 | Discharge: 2014-03-13 | Disposition: A | Discharge status: Home / Self Care | Payer: Medicare Other | Source: Ambulatory Visit | Attending: Family Medicine | Admitting: Family Medicine

## 2014-03-13 ENCOUNTER — Encounter (HOSPITAL_COMMUNITY): Payer: Self-pay | Admitting: Emergency Medicine

## 2014-03-13 ENCOUNTER — Inpatient Hospital Stay (HOSPITAL_COMMUNITY)
Admission: EM | Admit: 2014-03-13 | Discharge: 2014-03-19 | DRG: 835 | Disposition: A | Discharge status: Hospice | Payer: Medicare Other | Attending: Internal Medicine | Admitting: Internal Medicine

## 2014-03-13 DIAGNOSIS — B159 Hepatitis A without hepatic coma: Secondary | ICD-10-CM | POA: Diagnosis present

## 2014-03-13 DIAGNOSIS — D6959 Other secondary thrombocytopenia: Secondary | ICD-10-CM | POA: Diagnosis present

## 2014-03-13 DIAGNOSIS — Z888 Allergy status to other drugs, medicaments and biological substances status: Secondary | ICD-10-CM | POA: Diagnosis not present

## 2014-03-13 DIAGNOSIS — Z8614 Personal history of Methicillin resistant Staphylococcus aureus infection: Secondary | ICD-10-CM | POA: Diagnosis not present

## 2014-03-13 DIAGNOSIS — I10 Essential (primary) hypertension: Secondary | ICD-10-CM

## 2014-03-13 DIAGNOSIS — D638 Anemia in other chronic diseases classified elsewhere: Secondary | ICD-10-CM | POA: Diagnosis present

## 2014-03-13 DIAGNOSIS — E876 Hypokalemia: Secondary | ICD-10-CM | POA: Diagnosis present

## 2014-03-13 DIAGNOSIS — D72829 Elevated white blood cell count, unspecified: Secondary | ICD-10-CM | POA: Diagnosis present

## 2014-03-13 DIAGNOSIS — R531 Weakness: Secondary | ICD-10-CM | POA: Diagnosis present

## 2014-03-13 DIAGNOSIS — Z933 Colostomy status: Secondary | ICD-10-CM

## 2014-03-13 DIAGNOSIS — Z66 Do not resuscitate: Secondary | ICD-10-CM | POA: Diagnosis present

## 2014-03-13 DIAGNOSIS — E538 Deficiency of other specified B group vitamins: Secondary | ICD-10-CM | POA: Diagnosis present

## 2014-03-13 DIAGNOSIS — E039 Hypothyroidism, unspecified: Secondary | ICD-10-CM | POA: Diagnosis present

## 2014-03-13 DIAGNOSIS — M1990 Unspecified osteoarthritis, unspecified site: Secondary | ICD-10-CM | POA: Diagnosis present

## 2014-03-13 DIAGNOSIS — R140 Abdominal distension (gaseous): Secondary | ICD-10-CM

## 2014-03-13 DIAGNOSIS — C9200 Acute myeloblastic leukemia, not having achieved remission: Secondary | ICD-10-CM | POA: Diagnosis present

## 2014-03-13 DIAGNOSIS — Z515 Encounter for palliative care: Secondary | ICD-10-CM | POA: Diagnosis not present

## 2014-03-13 DIAGNOSIS — Z823 Family history of stroke: Secondary | ICD-10-CM

## 2014-03-13 DIAGNOSIS — K219 Gastro-esophageal reflux disease without esophagitis: Secondary | ICD-10-CM | POA: Diagnosis present

## 2014-03-13 DIAGNOSIS — F419 Anxiety disorder, unspecified: Secondary | ICD-10-CM | POA: Diagnosis present

## 2014-03-13 DIAGNOSIS — I509 Heart failure, unspecified: Secondary | ICD-10-CM

## 2014-03-13 DIAGNOSIS — Z8249 Family history of ischemic heart disease and other diseases of the circulatory system: Secondary | ICD-10-CM

## 2014-03-13 DIAGNOSIS — R740 Nonspecific elevation of levels of transaminase and lactic acid dehydrogenase [LDH]: Secondary | ICD-10-CM | POA: Diagnosis present

## 2014-03-13 DIAGNOSIS — I5042 Chronic combined systolic (congestive) and diastolic (congestive) heart failure: Secondary | ICD-10-CM | POA: Diagnosis present

## 2014-03-13 DIAGNOSIS — N183 Chronic kidney disease, stage 3 unspecified: Secondary | ICD-10-CM | POA: Diagnosis present

## 2014-03-13 DIAGNOSIS — D509 Iron deficiency anemia, unspecified: Secondary | ICD-10-CM | POA: Diagnosis present

## 2014-03-13 DIAGNOSIS — Z8619 Personal history of other infectious and parasitic diseases: Secondary | ICD-10-CM

## 2014-03-13 DIAGNOSIS — I129 Hypertensive chronic kidney disease with stage 1 through stage 4 chronic kidney disease, or unspecified chronic kidney disease: Secondary | ICD-10-CM | POA: Diagnosis present

## 2014-03-13 DIAGNOSIS — Z87440 Personal history of urinary (tract) infections: Secondary | ICD-10-CM | POA: Diagnosis not present

## 2014-03-13 DIAGNOSIS — I480 Paroxysmal atrial fibrillation: Secondary | ICD-10-CM | POA: Diagnosis present

## 2014-03-13 DIAGNOSIS — D649 Anemia, unspecified: Secondary | ICD-10-CM | POA: Diagnosis present

## 2014-03-13 DIAGNOSIS — Z882 Allergy status to sulfonamides status: Secondary | ICD-10-CM | POA: Diagnosis not present

## 2014-03-13 DIAGNOSIS — Z87891 Personal history of nicotine dependence: Secondary | ICD-10-CM | POA: Diagnosis not present

## 2014-03-13 DIAGNOSIS — Z79899 Other long term (current) drug therapy: Secondary | ICD-10-CM | POA: Diagnosis not present

## 2014-03-13 DIAGNOSIS — D696 Thrombocytopenia, unspecified: Secondary | ICD-10-CM | POA: Diagnosis present

## 2014-03-13 LAB — URINALYSIS, ROUTINE W REFLEX MICROSCOPIC
BILIRUBIN URINE: NEGATIVE
Glucose, UA: NEGATIVE mg/dL
Hgb urine dipstick: NEGATIVE
KETONES UR: NEGATIVE mg/dL
Leukocytes, UA: NEGATIVE
Nitrite: NEGATIVE
PH: 6.5 (ref 5.0–8.0)
Protein, ur: 30 mg/dL — AB
Specific Gravity, Urine: 1.017 (ref 1.005–1.030)
Urobilinogen, UA: 1 mg/dL (ref 0.0–1.0)

## 2014-03-13 LAB — COMPREHENSIVE METABOLIC PANEL
ALBUMIN: 3.7 g/dL (ref 3.5–5.2)
ALK PHOS: 67 U/L (ref 39–117)
ALT: 20 U/L (ref 0–35)
AST: 31 U/L (ref 0–37)
Anion gap: 16 — ABNORMAL HIGH (ref 5–15)
BUN: 26 mg/dL — AB (ref 6–23)
CO2: 23 meq/L (ref 19–32)
Calcium: 9.1 mg/dL (ref 8.4–10.5)
Chloride: 99 mEq/L (ref 96–112)
Creatinine, Ser: 1.12 mg/dL — ABNORMAL HIGH (ref 0.50–1.10)
GFR calc non Af Amer: 42 mL/min — ABNORMAL LOW (ref >90)
GFR, EST AFRICAN AMERICAN: 48 mL/min — AB (ref >90)
Glucose, Bld: 125 mg/dL — ABNORMAL HIGH (ref 70–99)
POTASSIUM: 3.4 meq/L — AB (ref 3.7–5.3)
Sodium: 138 mEq/L (ref 137–147)
Total Bilirubin: 0.5 mg/dL (ref 0.3–1.2)
Total Protein: 8 g/dL (ref 6.0–8.3)

## 2014-03-13 LAB — URINE MICROSCOPIC-ADD ON

## 2014-03-13 LAB — TROPONIN I: Troponin I: <0.3 ng/mL (ref <0.30)

## 2014-03-13 LAB — POC OCCULT BLOOD, ED: FECAL OCCULT BLD: NEGATIVE

## 2014-03-13 LAB — PREPARE RBC (CROSSMATCH)

## 2014-03-13 LAB — PRO B NATRIURETIC PEPTIDE: Pro B Natriuretic peptide (BNP): 1898 pg/mL — ABNORMAL HIGH (ref 0–450)

## 2014-03-13 MED ORDER — ALPRAZOLAM 0.25 MG PO TABS
0.1250 mg | ORAL_TABLET | Freq: Three times a day (TID) | ORAL | Status: DC | PRN
  Administered 2014-03-14 – 2014-03-15 (×3): 0.125 mg via ORAL
  Filled 2014-03-13 (×3): qty 1

## 2014-03-13 MED ORDER — ATENOLOL 25 MG PO TABS
25.0000 mg | ORAL_TABLET | Freq: Every day | ORAL | Status: DC
  Administered 2014-03-14 – 2014-03-18 (×4): 25 mg via ORAL
  Filled 2014-03-13 (×6): qty 1

## 2014-03-13 MED ORDER — ADULT MULTIVITAMIN W/MINERALS CH
1.0000 | ORAL_TABLET | Freq: Every day | ORAL | Status: DC
  Administered 2014-03-14 – 2014-03-17 (×4): 1 via ORAL
  Filled 2014-03-13 (×4): qty 1

## 2014-03-13 MED ORDER — BISACODYL 10 MG RE SUPP
10.0000 mg | Freq: Every day | RECTAL | Status: DC | PRN
Start: 2014-03-13 — End: 2014-03-19

## 2014-03-13 MED ORDER — ONDANSETRON HCL 4 MG PO TABS: 4.0000 mg | ORAL_TABLET | Freq: Four times a day (QID) | ORAL | Status: DC | PRN

## 2014-03-13 MED ORDER — VITAMIN B-1 100 MG PO TABS
100.0000 mg | ORAL_TABLET | Freq: Every day | ORAL | Status: DC
  Administered 2014-03-14 – 2014-03-17 (×4): 100 mg via ORAL
  Filled 2014-03-13 (×4): qty 1

## 2014-03-13 MED ORDER — AMLODIPINE BESYLATE 5 MG PO TABS
5.0000 mg | ORAL_TABLET | Freq: Every day | ORAL | Status: DC
  Administered 2014-03-14 – 2014-03-19 (×6): 5 mg via ORAL
  Filled 2014-03-13 (×6): qty 1

## 2014-03-13 MED ORDER — SODIUM CHLORIDE 0.9 % IV SOLN
Freq: Once | INTRAVENOUS | Status: AC
  Administered 2014-03-13: 22:00:00 via INTRAVENOUS

## 2014-03-13 MED ORDER — POTASSIUM CHLORIDE CRYS ER 10 MEQ PO TBCR
40.0000 meq | EXTENDED_RELEASE_TABLET | Freq: Once | ORAL | Status: AC
  Administered 2014-03-14: 40 meq via ORAL
  Filled 2014-03-13: qty 4

## 2014-03-13 MED ORDER — SODIUM CHLORIDE 0.9 % IV SOLN
Freq: Once | INTRAVENOUS | Status: AC
  Administered 2014-03-14: via INTRAVENOUS

## 2014-03-13 MED ORDER — POTASSIUM CHLORIDE 20 MEQ PO PACK
40.0000 meq | PACK | Freq: Once | ORAL | Status: DC
  Filled 2014-03-13: qty 2

## 2014-03-13 MED ORDER — FUROSEMIDE 10 MG/ML IJ SOLN
20.0000 mg | Freq: Once | INTRAMUSCULAR | Status: AC
  Administered 2014-03-14: 20 mg via INTRAVENOUS
  Filled 2014-03-13: qty 2

## 2014-03-13 MED ORDER — SODIUM CHLORIDE 0.9 % IV SOLN: INTRAVENOUS | Status: DC

## 2014-03-13 MED ORDER — DOCUSATE SODIUM 100 MG PO CAPS
100.0000 mg | ORAL_CAPSULE | Freq: Two times a day (BID) | ORAL | Status: DC
  Administered 2014-03-14 – 2014-03-16 (×4): 100 mg via ORAL
  Filled 2014-03-13 (×8): qty 1

## 2014-03-13 MED ORDER — MAGNESIUM CITRATE PO SOLN: 1.0000 | Freq: Once | ORAL | Status: AC | PRN

## 2014-03-13 MED ORDER — ONDANSETRON HCL 4 MG/2ML IJ SOLN
4.0000 mg | Freq: Four times a day (QID) | INTRAMUSCULAR | Status: DC | PRN
  Administered 2014-03-16: 4 mg via INTRAVENOUS
  Filled 2014-03-13: qty 2

## 2014-03-13 MED ORDER — FERROUS SULFATE 325 (65 FE) MG PO TABS
325.0000 mg | ORAL_TABLET | Freq: Every day | ORAL | Status: DC
  Administered 2014-03-14 – 2014-03-17 (×4): 325 mg via ORAL
  Filled 2014-03-13 (×4): qty 1

## 2014-03-13 MED ORDER — FUROSEMIDE 20 MG PO TABS
20.0000 mg | ORAL_TABLET | Freq: Every day | ORAL | Status: DC
  Administered 2014-03-14 – 2014-03-19 (×6): 20 mg via ORAL
  Filled 2014-03-13 (×6): qty 1

## 2014-03-13 MED ORDER — FOLIC ACID 1 MG PO TABS
1.0000 mg | ORAL_TABLET | Freq: Every day | ORAL | Status: DC
  Administered 2014-03-14 – 2014-03-17 (×4): 1 mg via ORAL
  Filled 2014-03-13 (×4): qty 1

## 2014-03-13 MED ORDER — SODIUM CHLORIDE 0.9 % IJ SOLN
3.0000 mL | Freq: Two times a day (BID) | INTRAMUSCULAR | Status: DC
  Administered 2014-03-14 – 2014-03-17 (×5): 3 mL via INTRAVENOUS

## 2014-03-13 NOTE — H&P (Signed)
[facility] History and Physical  [name] QMG:[id number] DOB: [birth year] DOA: 03/13/2014  Referring physician: Debby Freiberg, MD PCP: Donnie Coffin, MD   Chief Complaint: Anemia  HPI: [name] is a 80 y.o. female with prior anemia presents with symptomatic anemia. Patient has a history of iron deficiency anemia on chronic iron therapy presents with excessive fatigue. She states that she has absolutely no energy. She did not complain of dizziness or passing out.She states that she had no real SOB. She states that she did note she had some pounding in her chest at night. She states she was told to take her beta blockers at night for this. The patient finally decided to go to her PCP had lab work done and this shows a hemoglobin of 6.8 gram. In addition to this she has a history of SBO and had colostomy about a year ago. Patient states that she has does not like the ostomy but has no complaints of any blood in the bag. Her stool has been dark and brown because of the iron. She has no hematemesis and she has no upper abdominal pain. She also does have a history of MRSA   Review of Systems:  Constitutional:  No weight loss, night sweats, Fevers, chills, ++fatigue.  HEENT:  No headaches, Difficulty swallowing,Tooth/dental problems,Sore throat,  No sneezing  Cardio-vascular:  No chest pain, Orthopnea, PND, ++swelling in lower extremities  GI:  No heartburn, indigestion, abdominal pain, nausea, vomiting, diarrhea  Resp:  No shortness of breath with exertion or at rest. No excess mucus, no productive cough No coughing up of blood  Skin:  no rash or lesions GU:  no dysuria, change in color of urine, no urgency or frequency.  Musculoskeletal:  No joint pain or swelling. No decreased range of motion.  Psych:  No change in mood or affect. No depression or anxiety.   Past Medical History  Diagnosis Date  . Hypertension   . GERD (gastroesophageal reflux disease)   .  Anxiety   . Arthritis   . Chronic combined systolic and diastolic heart failure   . Hepatitis A   . Bowel obstruction 04/2012    s/p colostomy 11/2012  . Chronic UTI   . Bruises easily   . Paroxysmal atrial fibrillation    Past Surgical History  Procedure Laterality Date  . Cataract extraction Bilateral   . Dilatation & currettage/hysteroscopy with resectocope    . Laparoscopic diverted colostomy N/A 11/26/2012    Procedure: LAPAROSCOPIC COLOSTOMY PLACEMENT RIGID  SIGMOIDOSCOPY;  Surgeon: Leighton Ruff, MD;  Location: [facility];  Service: General;  Laterality: N/A;   Social History:  reports that she quit smoking about 75 years ago. Her smoking use included Cigarettes. She smoked 0.00 packs per day for 6 years. She has never used smokeless tobacco. She reports that she does not drink alcohol or use illicit drugs.  Allergies  Allergen Reactions  . Neosporin [Neomycin-Bacitracin Zn-Polymyx]   . Other     Depression medicines: nightmares, rash, vertigo,  Unsure of rx names, has tried 2.  . Celexa [Citalopram Hydrobromide] Rash  . Sulfa Antibiotics Rash    Family History  Problem Relation Age of Onset  . Congestive Heart Failure Mother   . Stroke Father   . Colon cancer Neg Hx      Prior to Admission medications   Medication Sig Start Date End Date Taking? Authorizing Provider  acetaminophen (TYLENOL) 500 MG tablet Take 500 mg by mouth every 6 (six) hours  as needed for moderate pain (pain).    Yes Historical Provider, MD  ALPRAZolam (XANAX) 0.25 MG tablet Take 0.5 tablets (0.125 mg total) by mouth 3 (three) times daily as needed for anxiety. 04/06/13  Yes Theodis Blaze, MD  amLODipine (NORVASC) 5 MG tablet Take 1 tablet (5 mg total) by mouth daily. 04/06/13  Yes Theodis Blaze, MD  atenolol (TENORMIN) 25 MG tablet Take 1 tablet (25 mg total) by mouth daily after breakfast. Patient taking differently: Take 25 mg by mouth at bedtime.  04/06/13  Yes Theodis Blaze, MD    Carboxymethylcellul-Glycerin (OPTIVE) 0.5-0.9 % SOLN Apply 1 drop to eye daily as needed (for dry eyes).   Yes Historical Provider, MD  cetirizine (ZYRTEC) 10 MG tablet Take 10 mg by mouth at bedtime.    Yes Historical Provider, MD  Ferrous Sulfate (IRON) 325 (65 FE) MG TABS Take 325 mg by mouth daily.   Yes Historical Provider, MD  folic acid (FOLVITE) 1 MG tablet Take 1 mg by mouth daily.   Yes Historical Provider, MD  furosemide (LASIX) 20 MG tablet Take 1 tablet (20 mg total) by mouth daily. 04/06/13  Yes Theodis Blaze, MD  potassium gluconate 595 MG TABS Take 595 mg by mouth daily.   Yes Historical Provider, MD  amoxicillin-clavulanate (AUGMENTIN) 500-125 MG per tablet Take 1 tablet (500 mg total) by mouth 2 (two) times daily. 04/06/13   Theodis Blaze, MD   Physical Exam: Filed Vitals:   03/13/14 2000 03/13/14 2126  BP: 138/63 143/46  Pulse: 70 64  Temp: 98.6 F (37 C)   TempSrc: Oral   Resp: 16 16  SpO2: 100% 96%    Wt Readings from Last 3 Encounters:  04/06/13 56.019 kg (123 lb 8 oz)  01/10/13 56.881 kg (125 lb 6.4 oz)  12/22/12 56.246 kg (124 lb)    General:  Appears calm and comfortable Eyes: PERRL, normal lids, irises & conjunctiva ENT: decreased hearing Neck: no LAD, masses or thyromegaly Cardiovascular: RRR, no m/r/g. ++LE edema. Respiratory: CTA bilaterally, no w/r/r. Normal respiratory effort. Abdomen: soft, ntnd ostomy with dark stool output Skin: no rash or induration seen on limited exam Musculoskeletal: grossly normal tone BUE/BLE Psychiatric: grossly normal mood and affect, speech fluent and appropriate Neurologic: grossly non-focal.          Labs on Admission:  Basic Metabolic Panel:  Recent Labs Lab 03/13/14 2020  NA 138  K 3.4*  CL 99  CO2 23  GLUCOSE 125*  BUN 26*  CREATININE 1.12*  CALCIUM 9.1   Liver Function Tests:  Recent Labs Lab 03/13/14 2020  AST 31  ALT 20  ALKPHOS 67  BILITOT 0.5  PROT 8.0  ALBUMIN 3.7   No results  for input(s): LIPASE, AMYLASE in the last 168 hours. No results for input(s): AMMONIA in the last 168 hours. CBC:  Recent Labs Lab 03/13/14 2020  WBC 39.1*  NEUTROABS 29.7*  HGB 6.8*  HCT 20.6*  MCV 102.5*  PLT 50*   Cardiac Enzymes:  Recent Labs Lab 03/13/14 2020  TROPONINI <0.30    BNP (last 3 results)  Recent Labs  04/03/13 0949 04/05/13 0500 03/13/14 2020  PROBNP 13219.0* 3467.0* 1898.0*   CBG: No results for input(s): GLUCAP in the last 168 hours.  Radiological Exams on Admission: Dg Chest 2 View  03/13/2014   CLINICAL DATA:  Weakness and tired for 2 weeks, history hypertension, atrial fibrillation  EXAM: CHEST  2 VIEW  COMPARISON:  04/04/2013  FINDINGS: Enlargement of cardiac silhouette.  Atherosclerotic calcification aorta.  Slight pulmonary vascular congestion.  Lungs grossly clear.  No infiltrate, pleural effusion or pneumothorax.  Pulmonary infiltrate/edema seen on previous exam not identified on current study.  Thoracolumbar scoliosis.  IMPRESSION: Enlargement of cardiac silhouette with pulmonary vascular congestion.  No definite acute abnormalities.   Electronically Signed   By: Lavonia Dana M.D.   On: 03/13/2014 20:54   Dg Abd 2 Views  03/13/2014   CLINICAL DATA:  Abdominal distention with swelling around the colostomy site with abdominal pain for 3-4 weeks  EXAM: ABDOMEN - 2 VIEW  COMPARISON:  None.  FINDINGS: There is increased stool burden throughout the colon. There is no large or small bowel obstructive pattern. No free extraluminal gas collections are demonstrated  There is moderate levo curvature of the mid lumbar spine.  IMPRESSION: Increased stool burden throughout the colon is consistent with clinical constipation.   Electronically Signed   By: David  Martinique   On: 03/13/2014 16:53      Assessment/Plan Active Problems:   HTN (hypertension)   CHF (congestive heart failure)   Symptomatic anemia   1. Symptomatic anemia -will transfuse 2 units  PRBCs -check iron stuides -check B12 folate levels as her MCV is elevated -will need lasix between units  2. CHF -continue with home medications -will give lasix between units of blood  3. Hypertension -continue with home medications -monitor pressures  4.   Hypokalemia -will give oral Klor now -monitor labs in am  5. Colostomy -will need ostomy care involvement   Code Status: DNR (must indicate code status--if unknown or must be presumed, indicate so) DVT Prophylaxis:SCDs Family Communication: Son and Lynnette Caffey (indicate person spoken with, if applicable, with phone number if by telephone) Disposition Plan: HOme (indicate anticipated LOS)  Time spent: 37min  Clarence Dunsmore A [facility] Pager [phone]

## 2014-03-13 NOTE — ED Notes (Signed)
Per family, pt has been feeling weak and "crummy" for 2 weeks. Pt saw PCP today and MD told her that her hemoglobin was 6 and her WBC was incredibly high. PCP recommended she come here. A&Ox4.

## 2014-03-13 NOTE — ED Provider Notes (Signed)
CSN: [id number]     Arrival date & time 03/13/14  1950 History   First MD Initiated Contact with Patient 03/13/14 2005     Chief Complaint  Patient presents with  . Weakness     (Consider location/radiation/quality/duration/timing/severity/associated sxs/prior Treatment) Patient is a 80 y.o. female presenting with weakness.  Weakness This is a recurrent problem. Episode onset: 2 weeks. The problem occurs constantly. The problem has been gradually worsening. Pertinent negatives include no chest pain, no abdominal pain, no headaches and no shortness of breath. The symptoms are aggravated by walking. Nothing relieves the symptoms.    Past Medical History  Diagnosis Date  . Hypertension   . GERD (gastroesophageal reflux disease)   . Anxiety   . Arthritis   . Chronic combined systolic and diastolic heart failure   . Hepatitis A   . Bowel obstruction 04/2012    s/p colostomy 11/2012  . Chronic UTI   . Bruises easily   . Paroxysmal atrial fibrillation    Past Surgical History  Procedure Laterality Date  . Cataract extraction Bilateral   . Dilatation & currettage/hysteroscopy with resectocope    . Laparoscopic diverted colostomy N/A 11/26/2012    Procedure: LAPAROSCOPIC COLOSTOMY PLACEMENT RIGID  SIGMOIDOSCOPY;  Surgeon: Leighton Ruff, MD;  Location: [facility];  Service: General;  Laterality: N/A;   Family History  Problem Relation Age of Onset  . Congestive Heart Failure Mother   . Stroke Father   . Colon cancer Neg Hx    History  Substance Use Topics  . Smoking status: Former Smoker -- 6 years    Types: Cigarettes    Quit date: 11/11/1938  . Smokeless tobacco: Never Used  . Alcohol Use: No   OB History    No data available     Review of Systems  Respiratory: Negative for shortness of breath.   Cardiovascular: Negative for chest pain.  Gastrointestinal: Negative for abdominal pain.  Neurological: Positive for weakness. Negative for headaches.  All other systems reviewed  and are negative.     Allergies  Neosporin; Other; Celexa; and Sulfa antibiotics  Home Medications   Prior to Admission medications   Medication Sig Start Date End Date Taking? Authorizing Provider  acetaminophen (TYLENOL) 500 MG tablet Take 500 mg by mouth every 6 (six) hours as needed for moderate pain (pain).    Yes Historical Provider, MD  ALPRAZolam (XANAX) 0.25 MG tablet Take 0.5 tablets (0.125 mg total) by mouth 3 (three) times daily as needed for anxiety. 04/06/13  Yes Theodis Blaze, MD  amLODipine (NORVASC) 5 MG tablet Take 1 tablet (5 mg total) by mouth daily. 04/06/13  Yes Theodis Blaze, MD  atenolol (TENORMIN) 25 MG tablet Take 1 tablet (25 mg total) by mouth daily after breakfast. Patient taking differently: Take 25 mg by mouth at bedtime.  04/06/13  Yes Theodis Blaze, MD  Carboxymethylcellul-Glycerin (OPTIVE) 0.5-0.9 % SOLN Apply 1 drop to eye daily as needed (for dry eyes).   Yes Historical Provider, MD  cetirizine (ZYRTEC) 10 MG tablet Take 10 mg by mouth at bedtime.    Yes Historical Provider, MD  Ferrous Sulfate (IRON) 325 (65 FE) MG TABS Take 325 mg by mouth daily.   Yes Historical Provider, MD  folic acid (FOLVITE) 1 MG tablet Take 1 mg by mouth daily.   Yes Historical Provider, MD  furosemide (LASIX) 20 MG tablet Take 1 tablet (20 mg total) by mouth daily. 04/06/13  Yes Theodis Blaze, MD  potassium gluconate 595 MG TABS Take 595 mg by mouth daily.   Yes Historical Provider, MD  amoxicillin-clavulanate (AUGMENTIN) 500-125 MG per tablet Take 1 tablet (500 mg total) by mouth 2 (two) times daily. 04/06/13   Theodis Blaze, MD   BP 143/46 mmHg  Pulse 64  Temp(Src) 98.6 F (37 C) (Oral)  Resp 16  SpO2 96% Physical Exam  Constitutional: She is oriented to person, place, and time. She appears well-developed and well-nourished.  HENT:  Head: Normocephalic and atraumatic.  Right Ear: External ear normal.  Left Ear: External ear normal.  Eyes: Conjunctivae and EOM are  normal. Pupils are equal, round, and reactive to light.  Neck: Normal range of motion. Neck supple.  Cardiovascular: Normal rate, regular rhythm, normal heart sounds and intact distal pulses.   Pulmonary/Chest: Effort normal and breath sounds normal.  Abdominal: Soft. Bowel sounds are normal. There is no tenderness.  Musculoskeletal: Normal range of motion.  Neurological: She is alert and oriented to person, place, and time. She has normal strength and normal reflexes. No cranial nerve deficit or sensory deficit. GCS eye subscore is 4. GCS verbal subscore is 5. GCS motor subscore is 6.  Skin: Skin is warm and dry.  Vitals reviewed.   ED Course  Procedures (including critical care time) Labs Review Labs Reviewed  CBC WITH DIFFERENTIAL - Abnormal; Notable for the following:    WBC 39.1 (*)    RBC 2.01 (*)    Hemoglobin 6.8 (*)    HCT 20.6 (*)    MCV 102.5 (*)    RDW 28.7 (*)    Platelets 50 (*)    Lymphocytes Relative 4 (*)    Monocytes Relative 20 (*)    nRBC 31 (*)    Neutro Abs 29.7 (*)    Monocytes Absolute 7.8 (*)    All other components within normal limits  COMPREHENSIVE METABOLIC PANEL - Abnormal; Notable for the following:    Potassium 3.4 (*)    Glucose, Bld 125 (*)    BUN 26 (*)    Creatinine, Ser 1.12 (*)    GFR calc non Af Amer 42 (*)    GFR calc Af Amer 48 (*)    Anion gap 16 (*)    All other components within normal limits  URINALYSIS, ROUTINE W REFLEX MICROSCOPIC  PRO B NATRIURETIC PEPTIDE  TROPONIN I  PATHOLOGIST SMEAR REVIEW  POC OCCULT BLOOD, ED  TYPE AND SCREEN  PREPARE RBC (CROSSMATCH)    Imaging Review Dg Chest 2 View  03/13/2014   CLINICAL DATA:  Weakness and tired for 2 weeks, history hypertension, atrial fibrillation  EXAM: CHEST  2 VIEW  COMPARISON:  04/04/2013  FINDINGS: Enlargement of cardiac silhouette.  Atherosclerotic calcification aorta.  Slight pulmonary vascular congestion.  Lungs grossly clear.  No infiltrate, pleural effusion or  pneumothorax.  Pulmonary infiltrate/edema seen on previous exam not identified on current study.  Thoracolumbar scoliosis.  IMPRESSION: Enlargement of cardiac silhouette with pulmonary vascular congestion.  No definite acute abnormalities.   Electronically Signed   By: Lavonia Dana M.D.   On: 03/13/2014 20:54   Dg Abd 2 Views  03/13/2014   CLINICAL DATA:  Abdominal distention with swelling around the colostomy site with abdominal pain for 3-4 weeks  EXAM: ABDOMEN - 2 VIEW  COMPARISON:  None.  FINDINGS: There is increased stool burden throughout the colon. There is no large or small bowel obstructive pattern. No free extraluminal gas collections are demonstrated  There is  moderate levo curvature of the mid lumbar spine.  IMPRESSION: Increased stool burden throughout the colon is consistent with clinical constipation.   Electronically Signed   By: David  Martinique   On: 03/13/2014 16:53     EKG Interpretation   Date/Time:  Monday March 13 2014 20:14:41 EST Ventricular Rate:  66 PR Interval:  226 QRS Duration: 101 QT Interval:  417 QTC Calculation: 437 R Axis:   1 Text Interpretation:  Sinus rhythm Ventricular premature complex Prolonged  PR interval No significant change since last tracing Confirmed by Debby Freiberg [phone]) on 03/13/2014 9:22:32 PM      MDM   Final diagnoses:  Weakness    80 y.o. female with pertinent PMH of HTN, CHF, prior UTI presents with weakness as described above. No focal neurologic symptoms reported. No infectious symptoms including fever, vomiting, diarrhea, blood loss reported. The patient states that at times her ostomy output is more liquid than normal. She does not endorse decreased by mouth intake. On arrival vital signs and physical exam as above. No abdominal tenderness. No focal neurodeficits. Labs obtained as above and remarkable for  Marked anemia and leukocytosis. Type and screen ordered. Order to transfuse 2 units.  Consulted hospitalist for  admission..    1. Weakness         Debby Freiberg, MD 03/13/14 [id number]

## 2014-03-13 NOTE — ED Notes (Signed)
4th floor notified patient on way to room

## 2014-03-13 NOTE — Progress Notes (Signed)
  CARE MANAGEMENT ED NOTE 03/13/2014  Patient:  [name], [name]   Account Number:  [id number]  Date Initiated:  03/13/2014  Documentation initiated by:  Livia Snellen  Subjective/Objective Assessment:   Patient presents to Ed with increased weakness.  Comes to ED after pcp office for abnormal labs     Subjective/Objective Assessment Detail:   WBC 39.1, HMG 6.8     Action/Plan:   Action/Plan Detail:   Anticipated DC Date:       Status Recommendation to Physician:   Result of Recommendation:    Other ED [place]  Other  PCP issues    Choice offered to / List presented to:            Status of service:  Completed, signed off  ED Comments:   ED Comments Detail:  EDCM spoke to patient and her family at bedside. Patient lives at home with her grand daugter [name].  Patient's son reports patient does not have any home health services at this time.  Patient has had home health services with [facility] in the past.  Patient has a walker, hospital bed, bedside commode, wheelchair and shower chair at home.  Patient isusually able to perform her own ADL's with minimal assist.  Patient was seen by her pcp [name] today and was sent to the ED.  EDCM provided patient's son list of hom ehalth agencies in [place].  EDCM explained with home health, the patient may receive a visiting RN, PT, OT, aide and social worker if needed.  Patient's family thankful for resources.  No further EDCM needs at this time.

## 2014-03-13 NOTE — ED Notes (Signed)
Bed: NG76 Expected date:  Expected time:  Means of arrival:  Comments: /coming from home-referred by [facility]-abnormal lab work

## 2014-03-13 NOTE — ED Notes (Addendum)
[name] with lab, HBG 6.8

## 2014-03-14 DIAGNOSIS — D696 Thrombocytopenia, unspecified: Secondary | ICD-10-CM | POA: Diagnosis present

## 2014-03-14 DIAGNOSIS — R531 Weakness: Secondary | ICD-10-CM | POA: Diagnosis present

## 2014-03-14 DIAGNOSIS — D649 Anemia, unspecified: Secondary | ICD-10-CM | POA: Diagnosis present

## 2014-03-14 LAB — RETICULOCYTES
RBC.: 3.04 MIL/uL — AB (ref 3.87–5.11)
RETIC CT PCT: 5.9 % — AB (ref 0.4–3.1)
Retic Count, Absolute: 179.4 10*3/uL (ref 19.0–186.0)

## 2014-03-14 LAB — CBC WITH DIFFERENTIAL/PLATELET
Basophils Absolute: 0 10*3/uL (ref 0.0–0.1)
Basophils Relative: 0 % (ref 0–1)
EOS ABS: 0 10*3/uL (ref 0.0–0.7)
Eosinophils Relative: 0 % (ref 0–5)
HCT: 20.6 % — ABNORMAL LOW (ref 36.0–46.0)
Hemoglobin: 6.8 g/dL — CL (ref 12.0–15.0)
Lymphocytes Relative: 15 % (ref 12–46)
Lymphs Abs: 1.6 10*3/uL (ref 0.7–4.0)
MCH: 33.8 pg (ref 26.0–34.0)
MCHC: 33 g/dL (ref 30.0–36.0)
MCV: 102.5 fL — ABNORMAL HIGH (ref 78.0–100.0)
MONO ABS: 7.8 10*3/uL — AB (ref 0.1–1.0)
Monocytes Relative: 9 % (ref 3–12)
Myelocytes: 1 %
NEUTROS ABS: 29.7 10*3/uL — AB (ref 1.7–7.7)
Neutrophils Relative %: 8 % — ABNORMAL LOW (ref 43–77)
OTHER: 67 %
PLATELETS: 50 10*3/uL — AB (ref 150–400)
RBC: 2.01 MIL/uL — AB (ref 3.87–5.11)
RDW: 28.7 % — ABNORMAL HIGH (ref 11.5–15.5)
WBC: 39.1 10*3/uL — ABNORMAL HIGH (ref 4.0–10.5)
nRBC: 31 /100 WBC — ABNORMAL HIGH

## 2014-03-14 LAB — IRON AND TIBC
Iron: 251 ug/dL — ABNORMAL HIGH (ref 42–135)
SATURATION RATIOS: 85 % — AB (ref 20–55)
TIBC: 294 ug/dL (ref 250–470)
UIBC: 43 ug/dL — ABNORMAL LOW (ref 125–400)

## 2014-03-14 LAB — PROCALCITONIN: Procalcitonin: 0.19 ng/mL

## 2014-03-14 LAB — COMPREHENSIVE METABOLIC PANEL
ALT: 17 U/L (ref 0–35)
AST: 27 U/L (ref 0–37)
Albumin: 3.2 g/dL — ABNORMAL LOW (ref 3.5–5.2)
Alkaline Phosphatase: 57 U/L (ref 39–117)
Anion gap: 16 — ABNORMAL HIGH (ref 5–15)
BUN: 18 mg/dL (ref 6–23)
CO2: 20 mEq/L (ref 19–32)
Calcium: 8.2 mg/dL — ABNORMAL LOW (ref 8.4–10.5)
Chloride: 100 mEq/L (ref 96–112)
Creatinine, Ser: 0.92 mg/dL (ref 0.50–1.10)
GFR calc Af Amer: 61 mL/min — ABNORMAL LOW (ref >90)
GFR calc non Af Amer: 53 mL/min — ABNORMAL LOW (ref >90)
GLUCOSE: 174 mg/dL — AB (ref 70–99)
Potassium: 3.4 mEq/L — ABNORMAL LOW (ref 3.7–5.3)
SODIUM: 136 meq/L — AB (ref 137–147)
Total Bilirubin: 0.6 mg/dL (ref 0.3–1.2)
Total Protein: 6.9 g/dL (ref 6.0–8.3)

## 2014-03-14 LAB — HEMOGLOBIN A1C
Hgb A1c MFr Bld: 6.1 % — ABNORMAL HIGH (ref <5.7)
MEAN PLASMA GLUCOSE: 128 mg/dL — AB (ref <117)

## 2014-03-14 LAB — MRSA PCR SCREENING: MRSA BY PCR: NEGATIVE

## 2014-03-14 LAB — PROTIME-INR
INR: 1.12 (ref 0.00–1.49)
PROTHROMBIN TIME: 14.5 s (ref 11.6–15.2)

## 2014-03-14 LAB — GLUCOSE, CAPILLARY: Glucose-Capillary: 108 mg/dL — ABNORMAL HIGH (ref 70–99)

## 2014-03-14 LAB — DIRECT ANTIGLOBULIN TEST (NOT AT ARMC)
DAT, IGG: NEGATIVE
DAT, complement: NEGATIVE

## 2014-03-14 LAB — VITAMIN B12

## 2014-03-14 LAB — LACTATE DEHYDROGENASE: LDH: 637 U/L — ABNORMAL HIGH (ref 94–250)

## 2014-03-14 LAB — APTT: aPTT: 29 seconds (ref 24–37)

## 2014-03-14 LAB — TSH: TSH: 81.51 u[IU]/mL — AB (ref 0.350–4.500)

## 2014-03-14 LAB — SAVE SMEAR

## 2014-03-14 LAB — PATHOLOGIST SMEAR REVIEW

## 2014-03-14 LAB — FERRITIN: Ferritin: 228 ng/mL (ref 10–291)

## 2014-03-14 MED ORDER — NAPHAZOLINE HCL 0.1 % OP SOLN
1.0000 [drp] | Freq: Four times a day (QID) | OPHTHALMIC | Status: DC | PRN
  Administered 2014-03-14 – 2014-03-16 (×3): 1 [drp] via OPHTHALMIC
  Filled 2014-03-14: qty 15

## 2014-03-14 MED ORDER — POTASSIUM CHLORIDE CRYS ER 20 MEQ PO TBCR
40.0000 meq | EXTENDED_RELEASE_TABLET | Freq: Two times a day (BID) | ORAL | Status: AC
  Administered 2014-03-14: 40 meq via ORAL
  Filled 2014-03-14: qty 2

## 2014-03-14 MED ORDER — LORATADINE 10 MG PO TABS
10.0000 mg | ORAL_TABLET | Freq: Every day | ORAL | Status: DC
  Administered 2014-03-14 – 2014-03-19 (×6): 10 mg via ORAL
  Filled 2014-03-14 (×6): qty 1

## 2014-03-14 MED ORDER — FLUTICASONE PROPIONATE 50 MCG/ACT NA SUSP
2.0000 | Freq: Every day | NASAL | Status: DC
  Administered 2014-03-14 – 2014-03-16 (×3): 2 via NASAL
  Filled 2014-03-14: qty 16

## 2014-03-14 NOTE — Plan of Care (Signed)
Problem: Phase I Progression Outcomes Goal: Hemodynamically stable Outcome: Progressing Pt receiving 2 units of blood to correct anemia

## 2014-03-14 NOTE — Progress Notes (Signed)
Up to BSC.

## 2014-03-14 NOTE — Care Management Note (Addendum)
    Page 1 of 2   03/17/2014     6:56:37 PM CARE MANAGEMENT NOTE 03/17/2014  Patient:  [name], [name]   Account Number:  [id number]  Date Initiated:  03/14/2014  Documentation initiated by:  Dessa Phi  Subjective/Objective Assessment:   80 Y/O F ADMITTED W/ANEMIA.WP:VXYI DEFICIENCY,CHF.     Action/Plan:   FROM HOME W/FAMILY(GOOD SUPPORT).HAS PCP,PHARMACY,CANE,RW,3N1.   Anticipated DC Date:  03/19/2014   Anticipated DC Plan:  [place]  CM consult      Choice offered to / List presented to:  C-3 Spouse           Status of service:  In process, will continue to follow Medicare Important Message given?   (If response is "NO", the following Medicare IM given date fields will be blank) Date Medicare IM given:   Medicare IM given by:   Date Additional Medicare IM given:   Additional Medicare IM given by:    Discharge Disposition:    Per UR Regulation:  Reviewed for med. necessity/level of care/duration of stay  If discussed at Long Length of Stay Meetings, dates discussed:    Comments:  03/17/14 6P-Daphney Hopke RN,BSN NCM [phone] REQUESTED TO TALK TO PATIENT ABOUT D/C PLANS, BUT WAS REDIRECTED SINCE PALLIATIVE TEAM WAS CONSULTING PATIENT/FAMILY. DIDN'T MEET INPATIENT CRITERIA FROM ADMISSION THROUGH 03/17/14 @ 12:30P,MD NOTIFIED, RECOMMENDED TO D/C TO [facility] THERE WERE NO BEDS @ [facility].ALTERANATIVE FOR D/C PLANS NOT ALLOWED FOR CM/SW: HOME W/HOSPICE SERVICES OR SNF W/PALLIATIVE SERVICES.PER [facility]ASON-THEY WILL HAVE A BED ON SUNDAY.  11.20.15 [place] RNBCN ACM [phone] CC44 docs in shadow chart to be scanned.  03/17/14 Jaquise Faux RN,BSN NCM [place] NOTIFIED OF PROCESS.PATIENT SEEN BY PALLIATIVE TEAM.CM WILL PROVIDE HOME HOSPICE PROVIDER LIST.SW WILL OFFER FACILITY OPTIONS.  03/15/14 Aiko Belko RN,BSN NCM [place].[facility] REP AWARE OF REFERRAL.AWAIT FINAL  HHC ORDERS. CC 44 GIVEN TO SPOUSE.VOICED UNDERSTANDING.PT-HH.PROVIDED SON [place] AGENCY LIST,HE WILL GIVE IT TO SPOUSE TO CHOOSE.AWAIT CHOICE.AWAIT FINAL HHPT ORDER.  03/14/14 Dyamond Tolosa RN,BSN NCM [phone] NO ANTICIPATED D/C NEEDS.

## 2014-03-14 NOTE — Consult Note (Signed)
WOC ostomy consult note Stoma type/location:  LLQ Colostomy Stomal assessment/size: 1 3/8" round, pink and moist. Well budded Peristomal assessment: Intact   Peristomal hernia noted.  Treatment options for stomal/peristomal skin: 1 piece flat pouch, per the son, who is the primary caregiver.  Output Soft, brown stool Ostomy pouching: 1pc  2 1/4" flat pouch.  Son indicates no barrier ring is needed and patient has a 6-7 day wear time. .  Education provided:  Patient defers to son, who has been caring for her.  States she has been too sick to care for herself. Will order needed supplies.  Will not follow at this time.  Please re-consult if needed.  Domenic Moras RN BSN [place] Pager [phone]

## 2014-03-14 NOTE — Plan of Care (Signed)
Problem: Phase I Progression Outcomes Goal: OOB as tolerated unless otherwise ordered Outcome: Progressing Goal: Initial discharge plan identified Outcome: Completed/Met Date Met:  03/14/14 Goal: Voiding-avoid urinary catheter unless indicated Outcome: Completed/Met Date Met:  03/14/14 Goal: Hemodynamically stable Outcome: Progressing

## 2014-03-14 NOTE — Plan of Care (Signed)
Problem: Phase I Progression Outcomes Goal: Hemodynamically stable Outcome: Completed/Met Date Met:  03/14/14     

## 2014-03-14 NOTE — Progress Notes (Signed)
[facility] PROGRESS NOTE  [name] EUM:[id number] DOB: 09/15/1920 DOA: 03/13/2014 PCP: Donnie Coffin, MD   INTERIM SUMMARY.  80 year old lady with h/o chronic diastolic heart failure, anemia, PAF, hypertension, was sent to ED for hemoglobin less than 7. She was transfused two unit of prbc and referred to medical service for admission.   Assessment/Plan: 1. Symptomatic anemia: - improved with 2 units of prbc transfusion. Smear showed atypical mononuclear cells concerning for lymphoproliferative vs leukemic reaction. Anemia panel ordered, LDH ordered. Stool for occult blood negative. Last sigmoidoscopy was last year. IR consulted for CT guided biopsy of the Bone marrow. Called on call hematologist, suggested to call Dr Alvy Bimler in a day or two after the biopsy is done.  Meanwhile follow up repeat H&H in am.   Thrombocytopenia: Unclear etiology. Last platelet count is around 30,000. Please transfuse if platelets less than 10,000. No evidence of bleeding seen.    Leukocytosis: Once again unclear etiology. Smear showed atypical mononuclear cells. CXR neg for infection, UA is negative. c diff pcr ordered .   Chronic diastolic heart failure She appears to be compensated. Resume home meds.   Hypertension Better controlled.    SBO s/p colostomy in 2014:  wound /ostomy care.   Paroxysmal atrial fibrillation: Rate controlled.   Hypokalemia: Replete as needed.     Code Status: full code Family Communication: discussed with family at bedside.  Disposition Plan: pending further investigation.    Consultants:  Oncology  IR  Procedures:  Ct guided biopsy scheduled.  Antibiotics:  none  HPI/Subjective: No chest pain or sob.   Objective: Filed Vitals:   03/14/14 0904  BP: 120/43  Pulse: 66  Temp:   Resp: 17    Intake/Output Summary (Last 24 hours) at 03/14/14 1521 Last data filed at 03/14/14 1133  Gross per 24 hour  Intake    956 ml  Output    450  ml  Net    506 ml   Filed Weights   03/13/14 2306  Weight: 69.264 kg (152 lb 11.2 oz)    Exam  General:  Alert afebrile comfortable  Cardiovascular: s1s2  Respiratory: ctab  Abdomen: soft non tender  Non distended bowel sounds heard  Musculoskeletal: trace pedal edema.   Data Reviewed: Basic Metabolic Panel:  Recent Labs Lab 03/13/14 2020 03/14/14 1000  NA 138 136*  K 3.4* 3.4*  CL 99 100  CO2 23 20  GLUCOSE 125* 174*  BUN 26* 18  CREATININE 1.12* 0.92  CALCIUM 9.1 8.2*   Liver Function Tests:  Recent Labs Lab 03/13/14 2020 03/14/14 1000  AST 31 27  ALT 20 17  ALKPHOS 67 57  BILITOT 0.5 0.6  PROT 8.0 6.9  ALBUMIN 3.7 3.2*   No results for input(s): LIPASE, AMYLASE in the last 168 hours. No results for input(s): AMMONIA in the last 168 hours. CBC:  Recent Labs Lab 03/13/14 2020 03/14/14 1000  WBC 39.1* 21.6*  NEUTROABS 29.7*  --   HGB 6.8* 8.7*  HCT 20.6* 26.3*  MCV 102.5* 94.3  PLT 50* 39*   Cardiac Enzymes:  Recent Labs Lab 03/13/14 2020  TROPONINI <0.30   BNP (last 3 results)  Recent Labs  04/03/13 0949 04/05/13 0500 03/13/14 2020  PROBNP 13219.0* 3467.0* 1898.0*   CBG:  Recent Labs Lab 03/14/14 0718  GLUCAP 108*    Recent Results (from the past 240 hour(s))  MRSA PCR Screening     Status: None   Collection Time: 03/13/14 11:29  PM  Result Value Ref Range Status   MRSA by PCR NEGATIVE NEGATIVE Final    Comment:        The GeneXpert MRSA Assay (FDA approved for NASAL specimens only), is one component of a comprehensive MRSA colonization surveillance program. It is not intended to diagnose MRSA infection nor to guide or monitor treatment for MRSA infections.      Studies: Dg Chest 2 View  03/13/2014   CLINICAL DATA:  Weakness and tired for 2 weeks, history hypertension, atrial fibrillation  EXAM: CHEST  2 VIEW  COMPARISON:  04/04/2013  FINDINGS: Enlargement of cardiac silhouette.  Atherosclerotic calcification  aorta.  Slight pulmonary vascular congestion.  Lungs grossly clear.  No infiltrate, pleural effusion or pneumothorax.  Pulmonary infiltrate/edema seen on previous exam not identified on current study.  Thoracolumbar scoliosis.  IMPRESSION: Enlargement of cardiac silhouette with pulmonary vascular congestion.  No definite acute abnormalities.   Electronically Signed   By: Lavonia Dana M.D.   On: 03/13/2014 20:54   Dg Abd 2 Views  03/13/2014   CLINICAL DATA:  Abdominal distention with swelling around the colostomy site with abdominal pain for 3-4 weeks  EXAM: ABDOMEN - 2 VIEW  COMPARISON:  None.  FINDINGS: There is increased stool burden throughout the colon. There is no large or small bowel obstructive pattern. No free extraluminal gas collections are demonstrated  There is moderate levo curvature of the mid lumbar spine.  IMPRESSION: Increased stool burden throughout the colon is consistent with clinical constipation.   Electronically Signed   By: David  Martinique   On: 03/13/2014 16:53    Scheduled Meds: . amLODipine  5 mg Oral Daily  . atenolol  25 mg Oral QHS  . docusate sodium  100 mg Oral BID  . ferrous sulfate  325 mg Oral QPC breakfast  . folic acid  1 mg Oral Daily  . furosemide  20 mg Oral Daily  . multivitamin with minerals  1 tablet Oral Daily  . potassium chloride  40 mEq Oral BID  . sodium chloride  3 mL Intravenous Q12H  . thiamine  100 mg Oral Daily   Continuous Infusions:   Active Problems:   HTN (hypertension)   CHF (congestive heart failure)   Symptomatic anemia   Anemia    Time spent: 35 min    [place]  [facility] Pager [phone]. If 7PM-7AM, please contact night-coverage at [email or web address], password [facility] 03/14/2014, 3:21 PM  LOS: 1 day

## 2014-03-14 NOTE — Evaluation (Signed)
Physical Therapy Evaluation Patient Details Name: [name] MRN: [id number] DOB: October 30, 1920 Today's Date: 03/14/2014   History of Present Illness  80 yo female admitted with anemia. hx of htn, anxiety, Hep A, P Afib, colostomy. Pt lives alone most of the time and was Mod I with ADLs/mobility at baseline.   Clinical Impression  On eval, pt was Min guard assist for mobility-able to ambulate ~75 feet with RW. Mod encouragement from therapist and family for pt to participate. Demonstrates general weakness and decreased activity tolerance. Some concern for days/times pt would be alone so recommend 24 hour supervision initially (just until strength and activity tolerance improve). Also recommend HHPT. Family present-discussed recommendations with them as well.     Follow Up Recommendations Home health PT;Supervision/Assistance - 24 hour (initially-until strength/activity tolerance improve)    Equipment Recommendations  None recommended by PT    Recommendations for Other Services OT consult     Precautions / Restrictions Precautions Precautions: Fall Precaution Comments: L side colostomy Restrictions Weight Bearing Restrictions: No      Mobility  Bed Mobility Overal bed mobility: Needs Assistance Bed Mobility: Supine to Sit;Sit to Supine     Supine to sit: Min guard Sit to supine: Min guard   General bed mobility comments: close guard for safety  Transfers Overall transfer level: Needs assistance Equipment used: Rolling walker (2 wheeled) Transfers: Sit to/from [facility] Sit to Stand: Min guard Stand pivot transfers: Min guard       General transfer comment: close guard for safety.  Ambulation/Gait Ambulation/Gait assistance: Min guard Ambulation Distance (Feet): 75 Feet Assistive device: Rolling walker (2 wheeled) Gait Pattern/deviations: Step-through pattern;Decreased stride length     General Gait Details: Mod encouragment for  participation. Slow gait speed. Pt fatigues fairly easily.   Stairs            Wheelchair Mobility    Modified Rankin (Stroke Patients Only)       Balance                                             Pertinent Vitals/Pain Pain Assessment: No/denies pain    Home Living Family/patient expects to be discharged to:: Private residence Living Arrangements:  (Lives with granddaughter) Available Help at Discharge: Family Type of Home: House Home Access: Stairs to enter   Technical brewer of Steps: 1 small step from porch to [place]: Two level;Able to live on main level with bedroom/bathroom Home Equipment: Gilford Rile - 2 wheels;Walker - 4 wheels;Bedside commode;Wheelchair - manual      Prior Function Level of Independence: Needs assistance      ADL's / Homemaking Assistance Needed: help with meals. Cristy helps pt into shower, otherwise pt sponge bathes        Hand Dominance   Dominant Hand: Right    Extremity/Trunk Assessment   Upper Extremity Assessment: Generalized weakness           Lower Extremity Assessment: Generalized weakness      Cervical / Trunk Assessment: Kyphotic  Communication   Communication: HOH  Cognition Arousal/Alertness: Awake/alert Behavior During Therapy: WFL for tasks assessed/performed Overall Cognitive Status: Within Functional Limits for tasks assessed                      General Comments      Exercises  Assessment/Plan    PT Assessment Patient needs continued PT services  PT Diagnosis Difficulty walking;Generalized weakness   PT Problem List Decreased strength;Decreased activity tolerance;Decreased balance;Decreased mobility  PT Treatment Interventions DME instruction;Gait training;Functional mobility training;Therapeutic activities;Therapeutic exercise;Balance training;Patient/family education   PT Goals (Current goals can be found in the Care Plan section) Acute  Rehab PT Goals Patient Stated Goal: none stated PT Goal Formulation: With patient/family Time For Goal Achievement: 03/28/14 Potential to Achieve Goals: Fair    Frequency Min 3X/week   Barriers to discharge        Co-evaluation               End of Session   Activity Tolerance: Patient limited by fatigue Patient left: in bed;with call bell/phone within reach;with family/visitor present           Time: 1421-1441 PT Time Calculation (min) (ACUTE ONLY): 20 min   Charges:   PT Evaluation $Initial PT Evaluation Tier I: 1 Procedure PT Treatments $Gait Training: 8-22 mins   PT G Codes:          Weston Anna, MPT Pager: [phone]

## 2014-03-14 NOTE — Progress Notes (Signed)
   03/14/14 1452  PT Time Calculation  PT Start Time (ACUTE ONLY) 1421  PT Stop Time (ACUTE ONLY) 1441  PT Time Calculation (min) (ACUTE ONLY) 20 min  PT G-Codes **NOT FOR INPATIENT CLASS**  Functional Assessment Tool Used clinical judgement  Functional Limitation Mobility: Walking and moving around  Mobility: Walking and Moving Around Current Status (O3500) CI  Mobility: Walking and Moving Around Goal Status (X3818) CI  PT General Charges  $$ ACUTE PT VISIT 1 Procedure  PT Evaluation  $Initial PT Evaluation Tier I 1 Procedure  PT Treatments  $Gait Training 8-22 mins   Weston Anna, MPT [phone]

## 2014-03-14 NOTE — Consult Note (Signed)
Reason for consult: bone marrow biopsy  Referring Physician(s): [facility]  History of Present Illness: [name] is a 80 y.o. female with known history of iron deficiency anemia and 3-4 week history of progressive weakness. Recent lab studies have revealed significant leukocytosis, anemia and thrombocytopenia. Request now received from [facility] and following hematology recommendations for CT guided bone marrow biopsy for further evaluation.  Past Medical History  Diagnosis Date  . Hypertension   . GERD (gastroesophageal reflux disease)   . Anxiety   . Arthritis   . Chronic combined systolic and diastolic heart failure   . Hepatitis A   . Bowel obstruction 04/2012    s/p colostomy 11/2012  . Chronic UTI   . Bruises easily   . Paroxysmal atrial fibrillation     Past Surgical History  Procedure Laterality Date  . Cataract extraction Bilateral   . Dilatation & currettage/hysteroscopy with resectocope    . Laparoscopic diverted colostomy N/A 11/26/2012    Procedure: LAPAROSCOPIC COLOSTOMY PLACEMENT RIGID  SIGMOIDOSCOPY;  Surgeon: Leighton Ruff, MD;  Location: [facility];  Service: General;  Laterality: N/A;    Allergies: Neosporin; Other; Celexa; and Sulfa antibiotics  Medications: Prior to Admission medications   Medication Sig Start Date End Date Taking? Authorizing Provider  acetaminophen (TYLENOL) 500 MG tablet Take 500 mg by mouth every 6 (six) hours as needed for moderate pain (pain).    Yes Historical Provider, MD  ALPRAZolam (XANAX) 0.25 MG tablet Take 0.5 tablets (0.125 mg total) by mouth 3 (three) times daily as needed for anxiety. 04/06/13  Yes Theodis Blaze, MD  amLODipine (NORVASC) 5 MG tablet Take 1 tablet (5 mg total) by mouth daily. 04/06/13  Yes Theodis Blaze, MD  atenolol (TENORMIN) 25 MG tablet Take 1 tablet (25 mg total) by mouth daily after breakfast. Patient taking differently: Take 25 mg by mouth at bedtime.  04/06/13  Yes Theodis Blaze, MD    Carboxymethylcellul-Glycerin (OPTIVE) 0.5-0.9 % SOLN Apply 1 drop to eye daily as needed (for dry eyes).   Yes Historical Provider, MD  cetirizine (ZYRTEC) 10 MG tablet Take 10 mg by mouth at bedtime.    Yes Historical Provider, MD  Ferrous Sulfate (IRON) 325 (65 FE) MG TABS Take 325 mg by mouth daily.   Yes Historical Provider, MD  folic acid (FOLVITE) 1 MG tablet Take 1 mg by mouth daily.   Yes Historical Provider, MD  furosemide (LASIX) 20 MG tablet Take 1 tablet (20 mg total) by mouth daily. 04/06/13  Yes Theodis Blaze, MD  potassium gluconate 595 MG TABS Take 595 mg by mouth daily.   Yes Historical Provider, MD  amoxicillin-clavulanate (AUGMENTIN) 500-125 MG per tablet Take 1 tablet (500 mg total) by mouth 2 (two) times daily. 04/06/13   Theodis Blaze, MD    Family History  Problem Relation Age of Onset  . Congestive Heart Failure Mother   . Stroke Father   . Colon cancer Neg Hx     History   Social History  . Marital Status: Widowed    Spouse Name: N/A    Number of Children: 1  . Years of Education: N/A   Occupational History  . retired    Social History Main Topics  . Smoking status: Former Smoker -- 6 years    Types: Cigarettes    Quit date: 11/11/1938  . Smokeless tobacco: Never Used  . Alcohol Use: No  . Drug Use: No  . Sexual Activity:  None   Other Topics Concern  . None   Social History Narrative   Lives with granddaughter who is Therapist, sports at [employer].  Uses walker for ambulation.           Review of Systems  Constitutional: Positive for activity change and fatigue. Negative for fever and chills.  HENT: Positive for congestion.   Respiratory: Negative for cough.   Cardiovascular: Positive for palpitations. Negative for chest pain.  Gastrointestinal: Negative for nausea, vomiting, abdominal pain and blood in stool.  Genitourinary: Negative for dysuria and hematuria.  Musculoskeletal: Negative for back pain.  Neurological: Positive for headaches.   Hematological: Bruises/bleeds easily.  Psychiatric/Behavioral: The patient is nervous/anxious.     Vital Signs: BP 120/43 mmHg  Pulse 66  Temp(Src) 98.6 F (37 C) (Oral)  Resp 17  Ht 5' 4"  (1.626 m)  Wt 152 lb 11.2 oz (69.264 kg)  BMI 26.20 kg/m2  SpO2 97%  Physical Exam  Constitutional: She is oriented to person, place, and time. She appears well-developed and well-nourished.  Cardiovascular: Normal rate and regular rhythm.   Pulmonary/Chest: Effort normal and breath sounds normal.  Abdominal: Soft. Bowel sounds are normal.  LLQ ostomy; mild gen tenderness  Musculoskeletal: She exhibits edema.  Neurological: She is alert and oriented to person, place, and time.    Imaging: Dg Chest 2 View  03/13/2014   CLINICAL DATA:  Weakness and tired for 2 weeks, history hypertension, atrial fibrillation  EXAM: CHEST  2 VIEW  COMPARISON:  04/04/2013  FINDINGS: Enlargement of cardiac silhouette.  Atherosclerotic calcification aorta.  Slight pulmonary vascular congestion.  Lungs grossly clear.  No infiltrate, pleural effusion or pneumothorax.  Pulmonary infiltrate/edema seen on previous exam not identified on current study.  Thoracolumbar scoliosis.  IMPRESSION: Enlargement of cardiac silhouette with pulmonary vascular congestion.  No definite acute abnormalities.   Electronically Signed   By: Lavonia Dana M.D.   On: 03/13/2014 20:54   Dg Abd 2 Views  03/13/2014   CLINICAL DATA:  Abdominal distention with swelling around the colostomy site with abdominal pain for 3-4 weeks  EXAM: ABDOMEN - 2 VIEW  COMPARISON:  None.  FINDINGS: There is increased stool burden throughout the colon. There is no large or small bowel obstructive pattern. No free extraluminal gas collections are demonstrated  There is moderate levo curvature of the mid lumbar spine.  IMPRESSION: Increased stool burden throughout the colon is consistent with clinical constipation.   Electronically Signed   By: David  Martinique   On:  03/13/2014 16:53    Labs:  CBC:  Recent Labs  04/05/13 0537 04/06/13 0553 03/13/14 2020 03/14/14 1000  WBC 5.0 5.7 39.1* 21.6*  HGB 9.2* 10.7* 6.8* 8.7*  HCT 30.7* 34.4* 20.6* 26.3*  PLT 104* 125* 50* 39*    COAGS:  Recent Labs  04/05/13 0537 03/14/14 1400  INR 1.23 1.12  APTT 32 29    BMP:  Recent Labs  04/05/13 0537 04/06/13 0553 03/13/14 2020 03/14/14 1000  NA 140 134* 138 136*  K 3.4* 3.9 3.4* 3.4*  CL 104 97 99 100  CO2 24 25 23 20   GLUCOSE 143* 109* 125* 174*  BUN 17 25* 26* 18  CALCIUM 8.6 9.1 9.1 8.2*  CREATININE 0.99 1.02 1.12* 0.92  GFRNONAA 49* 47* 42* 53*  GFRAA 56* 54* 48* 61*    LIVER FUNCTION TESTS:  Recent Labs  04/03/13 0949 03/13/14 2020 03/14/14 1000  BILITOT 0.7 0.5 0.6  AST 33 31 27  ALT  20 20 17   ALKPHOS 61 67 57  PROT 7.7 8.0 6.9  ALBUMIN 3.8 3.7 3.2*    TUMOR MARKERS: No results for input(s): AFPTM, CEA, CA199, CHROMGRNA in the last 8760 hours.  Assessment and Plan: [name] is a 80 y.o. female with known history of iron deficiency anemia and 3-4 week history of progressive weakness. Recent lab studies have revealed significant leukocytosis, anemia and thrombocytopenia. Request now received from [facility] and following hematology recommendations for CT guided bone marrow biopsy for further evaluation. Details/risks of procedure d/w pt/family with their understanding and consent. Procedure is tent scheduled for 11/18 am.       I spent a total of 20 minutes face to face in clinical consultation, greater than 50% of which was counseling/coordinating care for bone marrow biopsy  Signed: Autumn Messing 03/14/2014, 3:12 PM

## 2014-03-15 ENCOUNTER — Observation Stay (HOSPITAL_COMMUNITY): Payer: Medicare Other

## 2014-03-15 DIAGNOSIS — D72820 Lymphocytosis (symptomatic): Secondary | ICD-10-CM

## 2014-03-15 DIAGNOSIS — E538 Deficiency of other specified B group vitamins: Secondary | ICD-10-CM

## 2014-03-15 DIAGNOSIS — D696 Thrombocytopenia, unspecified: Secondary | ICD-10-CM

## 2014-03-15 DIAGNOSIS — D649 Anemia, unspecified: Secondary | ICD-10-CM

## 2014-03-15 DIAGNOSIS — D72829 Elevated white blood cell count, unspecified: Secondary | ICD-10-CM

## 2014-03-15 DIAGNOSIS — E039 Hypothyroidism, unspecified: Secondary | ICD-10-CM

## 2014-03-15 DIAGNOSIS — I5032 Chronic diastolic (congestive) heart failure: Secondary | ICD-10-CM

## 2014-03-15 DIAGNOSIS — I5030 Unspecified diastolic (congestive) heart failure: Secondary | ICD-10-CM

## 2014-03-15 DIAGNOSIS — I5020 Unspecified systolic (congestive) heart failure: Secondary | ICD-10-CM

## 2014-03-15 LAB — CBC
HCT: 26.3 % — ABNORMAL LOW (ref 36.0–46.0)
HCT: 26.5 % — ABNORMAL LOW (ref 36.0–46.0)
Hemoglobin: 8.7 g/dL — ABNORMAL LOW (ref 12.0–15.0)
Hemoglobin: 8.7 g/dL — ABNORMAL LOW (ref 12.0–15.0)
MCH: 31.2 pg (ref 26.0–34.0)
MCH: 31.4 pg (ref 26.0–34.0)
MCHC: 33.1 g/dL (ref 30.0–36.0)
MCHC: 32.8 g/dL (ref 30.0–36.0)
MCV: 94.3 fL (ref 78.0–100.0)
MCV: 95.7 fL (ref 78.0–100.0)
Platelets: 39 10*3/uL — ABNORMAL LOW (ref 150–400)
Platelets: 45 10*3/uL — ABNORMAL LOW (ref 150–400)
RBC: 2.79 MIL/uL — AB (ref 3.87–5.11)
RBC: 2.77 MIL/uL — AB (ref 3.87–5.11)
RDW: 23.6 % — ABNORMAL HIGH (ref 11.5–15.5)
RDW: 24.5 % — ABNORMAL HIGH (ref 11.5–15.5)
WBC: 21.6 10*3/uL — ABNORMAL HIGH (ref 4.0–10.5)
WBC: 29.2 10*3/uL — AB (ref 4.0–10.5)

## 2014-03-15 LAB — BASIC METABOLIC PANEL
Anion gap: 14 (ref 5–15)
BUN: 15 mg/dL (ref 6–23)
CALCIUM: 8.3 mg/dL — AB (ref 8.4–10.5)
CO2: 21 mEq/L (ref 19–32)
CREATININE: 0.92 mg/dL (ref 0.50–1.10)
Chloride: 104 mEq/L (ref 96–112)
GFR, EST AFRICAN AMERICAN: 61 mL/min — AB (ref >90)
GFR, EST NON AFRICAN AMERICAN: 53 mL/min — AB (ref >90)
Glucose, Bld: 106 mg/dL — ABNORMAL HIGH (ref 70–99)
Potassium: 3.6 mEq/L — ABNORMAL LOW (ref 3.7–5.3)
Sodium: 139 mEq/L (ref 137–147)

## 2014-03-15 LAB — FOLATE RBC: RBC Folate: 1529 ng/mL — ABNORMAL HIGH (ref >280)

## 2014-03-15 LAB — GLUCOSE, CAPILLARY: Glucose-Capillary: 102 mg/dL — ABNORMAL HIGH (ref 70–99)

## 2014-03-15 LAB — CLOSTRIDIUM DIFFICILE BY PCR: Toxigenic C. Difficile by PCR: NEGATIVE

## 2014-03-15 LAB — BONE MARROW EXAM: Bone Marrow Exam: 794

## 2014-03-15 MED ORDER — MIDAZOLAM HCL 2 MG/2ML IJ SOLN
INTRAMUSCULAR | Status: AC
  Filled 2014-03-15: qty 6

## 2014-03-15 MED ORDER — POTASSIUM CHLORIDE CRYS ER 20 MEQ PO TBCR
40.0000 meq | EXTENDED_RELEASE_TABLET | Freq: Once | ORAL | Status: AC
  Administered 2014-03-15: 40 meq via ORAL
  Filled 2014-03-15: qty 2

## 2014-03-15 MED ORDER — FENTANYL CITRATE 0.05 MG/ML IJ SOLN
INTRAMUSCULAR | Status: AC
  Filled 2014-03-15: qty 4

## 2014-03-15 MED ORDER — MIDAZOLAM HCL 2 MG/2ML IJ SOLN
INTRAMUSCULAR | Status: AC | PRN
  Administered 2014-03-15 (×3): 0.5 mg via INTRAVENOUS

## 2014-03-15 MED ORDER — FENTANYL CITRATE 0.05 MG/ML IJ SOLN
INTRAMUSCULAR | Status: AC | PRN
  Administered 2014-03-15 (×3): 25 ug via INTRAVENOUS

## 2014-03-15 NOTE — Procedures (Signed)
Technically successful CT guided bone marrow aspiration and biopsy of left iliac crest. No immediate complications.   

## 2014-03-15 NOTE — Progress Notes (Signed)
[facility] PROGRESS NOTE  [name] CEY:[id number] DOB: Sep 15, 1920 DOA: 03/13/2014 PCP: Donnie Coffin, MD  INTERIM SUMMARY.  80 year old lady with h/o chronic diastolic heart failure, anemia, PAF, hypertension, was sent to ED for hemoglobin less than 7. She was transfused two unit of prbc and referred to medical service for admission.     Assessment/Plan: #1 symptomatic anemia Unknown etiology.FOBT is negative. Concern for myelodysplastic syndrome. Patient is status post 2 units packed red blood cells with clinical improvement. Hemoglobin currently at 8.7 from 6.8 on admission. Patient is status post bone marrow biopsy with results pending. Will consulted hematology. Follow H&H.  #2 leukocytosis Questionable etiology. Peripheral smear with atypical mononuclear cells worrisome for a lymphoproliferative process versus a leukemic process. Urinalysis is negative for UTI. Patient is afebrile. Chest x-ray negative for pneumonia. Will consult with hematology/oncology for further evaluation and management.   #3 thrombocytopenia Unknown etiology. LDH elevated at 637. Patient with no signs of bleeding. Follow platelets. Hematology/oncology consultation pending.  #4 hypokalemia Replete.  #5 chronic diastolic heart failure Stable. Compensated. Continue home regimen of atenolol, Norvasc, Lasix.  #6 hypertension Stable. Continue atenolol, Lasix, Norvasc.  #7 history of small bowel obstruction status post colostomy 2014 Stool in colostomy bag. Continue ostomy care.  #8 paroxysmal atrial fibrillation Rate controlled on atenolol. Follow.  #9 prophylaxis SCDs for DVT prophylaxis.   Code Status: full Family Communication: updated patient and family at bedside. Disposition Plan: home when medically stable.   Consultants:  Interventional radiology: Dr. Pascal Lux  Hematology/oncology Dr. Alvy Bimler pending  Procedures:  CT-guided bone marrow biopsy 03/15/2014 per Dr. Pascal Lux  interventional radiology  Chest x-ray 03/13/2014  Abdominal x-ray 03/13/2014  2 units packed red blood cells 03/13/2014,03/14/2014  Antibiotics:  none  HPI/Subjective: Patient alert. Patient asking whether bone marrow biopsy has been finished. No complaints.  Objective: Filed Vitals:   03/15/14 1000  BP: 133/66  Pulse: 62  Temp:   Resp: 16    Intake/Output Summary (Last 24 hours) at 03/15/14 1132 Last data filed at 03/15/14 0550  Gross per 24 hour  Intake    240 ml  Output   1400 ml  Net  -1160 ml   Filed Weights   03/13/14 2306 03/15/14 0637  Weight: 69.264 kg (152 lb 11.2 oz) 67.042 kg (147 lb 12.8 oz)    Exam:   General:  NAD  Cardiovascular: RRR  Respiratory: clear to auscultation bilaterally.  Abdomen: soft, nontender, nondistended, positive bowel sounds.Colostomy bag intact with stool.  Musculoskeletal: no clubbing cyanosis or edema.  Data Reviewed: Basic Metabolic Panel:  Recent Labs Lab 03/13/14 2020 03/14/14 1000 03/15/14 0610  NA 138 136* 139  K 3.4* 3.4* 3.6*  CL 99 100 104  CO2 23 20 21   GLUCOSE 125* 174* 106*  BUN 26* 18 15  CREATININE 1.12* 0.92 0.92  CALCIUM 9.1 8.2* 8.3*   Liver Function Tests:  Recent Labs Lab 03/13/14 2020 03/14/14 1000  AST 31 27  ALT 20 17  ALKPHOS 67 57  BILITOT 0.5 0.6  PROT 8.0 6.9  ALBUMIN 3.7 3.2*   No results for input(s): LIPASE, AMYLASE in the last 168 hours. No results for input(s): AMMONIA in the last 168 hours. CBC:  Recent Labs Lab 03/13/14 2020 03/14/14 1000 03/15/14 0610  WBC 39.1* 21.6* 29.2*  NEUTROABS 29.7*  --   --   HGB 6.8* 8.7* 8.7*  HCT 20.6* 26.3* 26.5*  MCV 102.5* 94.3 95.7  PLT 50* 39* 45*   Cardiac Enzymes:  Recent Labs Lab 03/13/14 2020  TROPONINI <0.30   BNP (last 3 results)  Recent Labs  04/03/13 0949 04/05/13 0500 03/13/14 2020  PROBNP 13219.0* 3467.0* 1898.0*   CBG:  Recent Labs Lab 03/14/14 0718 03/15/14 0740  GLUCAP 108* 102*     Recent Results (from the past 240 hour(s))  MRSA PCR Screening     Status: None   Collection Time: 03/13/14 11:29 PM  Result Value Ref Range Status   MRSA by PCR NEGATIVE NEGATIVE Final    Comment:        The GeneXpert MRSA Assay (FDA approved for NASAL specimens only), is one component of a comprehensive MRSA colonization surveillance program. It is not intended to diagnose MRSA infection nor to guide or monitor treatment for MRSA infections.   Clostridium Difficile by PCR     Status: None   Collection Time: 03/14/14  1:48 PM  Result Value Ref Range Status   C difficile by pcr NEGATIVE NEGATIVE Final    Comment: Performed at [facility]     Studies: Dg Chest 2 View  03/13/2014   CLINICAL DATA:  Weakness and tired for 2 weeks, history hypertension, atrial fibrillation  EXAM: CHEST  2 VIEW  COMPARISON:  04/04/2013  FINDINGS: Enlargement of cardiac silhouette.  Atherosclerotic calcification aorta.  Slight pulmonary vascular congestion.  Lungs grossly clear.  No infiltrate, pleural effusion or pneumothorax.  Pulmonary infiltrate/edema seen on previous exam not identified on current study.  Thoracolumbar scoliosis.  IMPRESSION: Enlargement of cardiac silhouette with pulmonary vascular congestion.  No definite acute abnormalities.   Electronically Signed   By: Lavonia Dana M.D.   On: 03/13/2014 20:54   Dg Abd 2 Views  03/13/2014   CLINICAL DATA:  Abdominal distention with swelling around the colostomy site with abdominal pain for 3-4 weeks  EXAM: ABDOMEN - 2 VIEW  COMPARISON:  None.  FINDINGS: There is increased stool burden throughout the colon. There is no large or small bowel obstructive pattern. No free extraluminal gas collections are demonstrated  There is moderate levo curvature of the mid lumbar spine.  IMPRESSION: Increased stool burden throughout the colon is consistent with clinical constipation.   Electronically Signed   By: David  Martinique   On: 03/13/2014 16:53     Scheduled Meds: . amLODipine  5 mg Oral Daily  . atenolol  25 mg Oral QHS  . docusate sodium  100 mg Oral BID  . ferrous sulfate  325 mg Oral QPC breakfast  . fluticasone  2 spray Each Nare Daily  . folic acid  1 mg Oral Daily  . furosemide  20 mg Oral Daily  . loratadine  10 mg Oral Daily  . multivitamin with minerals  1 tablet Oral Daily  . sodium chloride  3 mL Intravenous Q12H  . thiamine  100 mg Oral Daily   Continuous Infusions:   Principal Problem:   Symptomatic anemia Active Problems:   Leukocytosis   HTN (hypertension)   CHF (congestive heart failure)   Chronic kidney disease (CKD), stage III (moderate)   Anemia   Weakness   Thrombocytopenia    Time spent: [facility] Pager [phone]. If 7PM-7AM, please contact night-coverage at [email or web address], password [facility] 03/15/2014, 11:32 AM  LOS: 2 days

## 2014-03-15 NOTE — Progress Notes (Signed)
UR completed 

## 2014-03-15 NOTE — Plan of Care (Signed)
Problem: Phase II Progression Outcomes Goal: Vital signs remain stable Outcome: Completed/Met Date Met:  03/15/14     

## 2014-03-15 NOTE — Consult Note (Signed)
[place]  Telephone:(336) [place]                                MR#: [id number]  DOB: [birth year]                       CSN#: [id number]  Referring MD: Dr. Sheliah Plane [facility]     Patient Care Team: Donnie Coffin, MD as PCP - General (Family Medicine)  Reason for Consult: Leukemia I have seen the patient, examined her and edited the notes as follows   [id number] L [name] is a 80 y.o. female with a history of anemia of iron deficiency for at least 2 years, on iron supplements, and B-12 deficiency after small bowel obstruction for which she required colostomy In 2014, and a history of hepatitis A in the remote past, admitted on 03/13/2014 with excessive fatigue and shortness of breath with palpitations. The patient had noted dark-colored stools, but she denied any acute bleeding issues such as epistaxis, hematemesis, hematochezia, or hematuria. No NSAIDs s or aspirin prior to admission.  Prior to her admission she was seen by her primary care physician, at which time labs were drawn. Her hemoglobin was 6.8, Hematocrit of 20.6, and MCV of 102.5. On admission, she received 2 units of blood with good response, now with a hemoglobin of 8.7 .  In addition, her CBC showed other abnormalities, including leukocytosis and thrombocytopenia. Her white count on admission was 39.1 (now 29.7), with ANC of 29.7 and monocytes of 7.8  and her platelets were 50,000, now dropping to 45,000. These findings are new compared to prior labs. Smear is available for review. Interventional radiology performed a bone marrow biopsy On 03/15/2014  to rule out any bone marrow process which may explain her abnormal CBC, Specifically her leukocytosis.  She denies recent infection. The last prescription antibiotics was more than 3 months ago.There is not reported symptoms of sinus congestion, cough, urinary frequency/urgency or dysuria, diarrhea,  joint swelling/pain or abnormal skin rash. She denies any fever or chills, but she does have intermittent night sweats.She had no prior history or diagnosis of cancer. Her age appropriate screening programs are up-to-date.The patient has no prior diagnosis of autoimmune disease and was not prescribed corticosteroids related products. She denies any history of tobacco abuse. No alcohol intake.Patient denies any history or family history of leukemia or other bone marrow disorders. Never had a bone marrow biopsy in the past. She denies any exposure to dangerous chemicals or asbestos.   Anemia panel showed iron of 251 with TIBC 294, saturation of 85, and ferritin of 228. B-12 was greater than 2000. Hemoccult was negative. UA was negative for protein, Leukocytes or blood. DAT was negative.TSH was very elevated at 81.510. Fatigue count was 179.4. Her liver function tests were normal. Her renal functions where remarkable for a BUN of 26 with a creatinine of 1.12, that is slightly abnormal from baseline. In addition, her pro BNP was elevated, suggestive of congestive heart failure.  We were asked to see the patient in consultation, with recommendations from the hematologic standpoint                    PMH:  Past Medical History  Diagnosis Date  . Hypertension   . GERD (gastroesophageal reflux disease)   . Anxiety   .  Arthritis   . Chronic combined systolic and diastolic heart failure   . Hepatitis A   . Bowel obstruction 04/2012    s/p colostomy 11/2012  . Chronic UTI   . Bruises easily   . Paroxysmal atrial fibrillation     Surgeries:  Past Surgical History  Procedure Laterality Date  . Cataract extraction Bilateral   . Dilatation & currettage/hysteroscopy with resectocope    . Laparoscopic diverted colostomy N/A 11/26/2012    Procedure: LAPAROSCOPIC COLOSTOMY PLACEMENT RIGID  SIGMOIDOSCOPY;  Surgeon: Leighton Ruff, MD;  Location: [facility];  Service: General;  Laterality: N/A;    Allergies:    Allergies  Allergen Reactions  . Neosporin [Neomycin-Bacitracin Zn-Polymyx]   . Other     Depression medicines: nightmares, rash, vertigo,  Unsure of rx names, has tried 2.  . Celexa [Citalopram Hydrobromide] Rash  . Sulfa Antibiotics Rash    Medications:   Prior to Admission:  Prescriptions prior to admission  Medication Sig Dispense Refill Last Dose  . acetaminophen (TYLENOL) 500 MG tablet Take 500 mg by mouth every 6 (six) hours as needed for moderate pain (pain).    03/13/2014 at Unknown time  . ALPRAZolam (XANAX) 0.25 MG tablet Take 0.5 tablets (0.125 mg total) by mouth 3 (three) times daily as needed for anxiety. 30 tablet 0 03/13/2014 at Unknown time  . amLODipine (NORVASC) 5 MG tablet Take 1 tablet (5 mg total) by mouth daily. 30 tablet 1 03/13/2014 at Unknown time  . atenolol (TENORMIN) 25 MG tablet Take 1 tablet (25 mg total) by mouth daily after breakfast. (Patient taking differently: Take 25 mg by mouth at bedtime. ) 30 tablet 1 03/12/2014 at 2200  . Carboxymethylcellul-Glycerin (OPTIVE) 0.5-0.9 % SOLN Apply 1 drop to eye daily as needed (for dry eyes).   03/13/2014 at Unknown time  . cetirizine (ZYRTEC) 10 MG tablet Take 10 mg by mouth at bedtime.    03/12/2014 at Unknown time  . Ferrous Sulfate (IRON) 325 (65 FE) MG TABS Take 325 mg by mouth daily.   03/13/2014 at Unknown time  . folic acid (FOLVITE) 1 MG tablet Take 1 mg by mouth daily.   03/13/2014 at Unknown time  . furosemide (LASIX) 20 MG tablet Take 1 tablet (20 mg total) by mouth daily. 30 tablet 1 03/13/2014 at Unknown time  . potassium gluconate 595 MG TABS Take 595 mg by mouth daily.   03/13/2014 at Unknown time  . amoxicillin-clavulanate (AUGMENTIN) 500-125 MG per tablet Take 1 tablet (500 mg total) by mouth 2 (two) times daily. 4 tablet 0    Scheduled Meds: . amLODipine  5 mg Oral Daily  . atenolol  25 mg Oral QHS  . docusate sodium  100 mg Oral BID  . ferrous sulfate  325 mg Oral QPC breakfast  .  fluticasone  2 spray Each Nare Daily  . folic acid  1 mg Oral Daily  . furosemide  20 mg Oral Daily  . loratadine  10 mg Oral Daily  . multivitamin with minerals  1 tablet Oral Daily  . sodium chloride  3 mL Intravenous Q12H  . thiamine  100 mg Oral Daily   Continuous Infusions:  PRN Meds:.ALPRAZolam, bisacodyl, naphazoline, ondansetron **OR** ondansetron (ZOFRAN) IV   ROS: Constitutional: Denies fevers, chills but does have abnormal night sweats. She had weight gain due to congestive heart failure, now normalized Eyes: Denies blurriness of vision, double vision or watery eyes Ears, nose, mouth, throat, and face: Denies mucositis or sore  throat Respiratory: Denies cough, she does have dyspnea on exertion but no wheezes Cardiovascular: Occasional  palpitations, chest discomfort and lower extremity swelling In the setting of congestive heart failure Gastrointestinal:  Denies nausea, heartburn or change in bowel habits. She has a colostomy from prior bowel obstruction Skin: Denies abnormal skin rashes, but bruises easily Lymphatics: Denies new lymphadenopathy. Neurological:Denies numbness, tingling or new weaknesses Behavioral/Psych: Mood is stable, no new changes  All other systems were reviewed with the patient and are negative.  Family History:    Family History  Problem Relation Age of Onset  . Congestive Heart Failure Mother   . Stroke Father   . Colon cancer Neg Hx   No family history of hematological  disorders. One paternal aunt had GYN cancer  Social History:  The patient is widowed she has one son in good health. She lives in [place]. She is retired. Denies smoking history.  Physical Exam    ECOG PERFORMANCE STATUS: 2  Filed Vitals:   03/15/14 1000  BP: 133/66  Pulse: 62  Temp:   Resp: 16   Filed Weights   03/13/14 2306 03/15/14 0637  Weight: 152 lb 11.2 oz (69.264 kg) 147 lb 12.8 oz (67.042 kg)    GENERAL:alert, no distress and comfortable SKIN: skin  color is pale, texture, turgor are normal, no rashes or significant lesions. Noted significant skin bruising but no petechiae. EYES: normal, conjunctiva are pale and non-injected, sclera clear OROPHARYNX:no exudate, no erythema and lips, buccal mucosa, and tongue normal  NECK: supple, thyroid normal size, non-tender, without nodularity LYMPH:  no palpable lymphadenopathy in the cervical, axillary or inguinal LUNGS: clear to auscultation and percussion with normal breathing effort HEART: regular rate & rhythm and no murmurs and trace of lower extremity edema ABDOMEN:abdomen soft, non-tender and normal bowel sounds. Left lower quadrant colostomy with some stool. Musculoskeletal:no cyanosis of digits and no clubbing  PSYCH: alert & oriented x 3 with fluent speech NEURO: no focal motor/sensory deficits   Labs:  CBC   Recent Labs Lab 03/13/14 2020 03/14/14 1000 03/15/14 0610  WBC 39.1* 21.6* 29.2*  HGB 6.8* 8.7* 8.7*  HCT 20.6* 26.3* 26.5*  PLT 50* 39* 45*  MCV 102.5* 94.3 95.7  MCH 33.8 31.2 31.4  MCHC 33.0 33.1 32.8  RDW 28.7* 23.6* 24.5*  LYMPHSABS 1.6  --   --   MONOABS 7.8*  --   --   EOSABS 0.0  --   --   BASOSABS 0.0  --   --      CMP    Recent Labs Lab 03/13/14 2020 03/14/14 1000 03/15/14 0610  NA 138 136* 139  K 3.4* 3.4* 3.6*  CL 99 100 104  CO2 _0 GLUCOSE 125* 174* 106*  BUN 26* 18 15  CREATININE 1.12* 0.92 0.92  CALCIUM 9.1 8.2* 8.3*  AST 31 27  --   ALT 20 17  --   ALKPHOS 67 57  --   BILITOT 0.5 0.6  --         Component Value Date/Time   BILITOT 0.6 03/14/2014 1000      Recent Labs Lab 03/14/14 1400  INR 1.12    No results for input(s): DDIMER in the last 72 hours.   Anemia panel:   Recent Labs  03/14/14 1000  VITAMINB12 >2000*  FERRITIN 228  TIBC 294  IRON 251*  RETICCTPCT 5.9*     Imaging Studies:  Dg Chest 2 View  03/13/2014  CLINICAL DATA:  Weakness and tired for 2 weeks, history hypertension, atrial  fibrillation  EXAM: CHEST  2 VIEW  COMPARISON:  04/04/2013  FINDINGS: Enlargement of cardiac silhouette.  Atherosclerotic calcification aorta.  Slight pulmonary vascular congestion.  Lungs grossly clear.  No infiltrate, pleural effusion or pneumothorax.  Pulmonary infiltrate/edema seen on previous exam not identified on current study.  Thoracolumbar scoliosis.  IMPRESSION: Enlargement of cardiac silhouette with pulmonary vascular congestion.  No definite acute abnormalities.   Electronically Signed   By: Lavonia Dana M.D.   On: 03/13/2014 20:54   Ct Biopsy  03/15/2014   INDICATION: Unspecified anemia, now with leukocytosis. Please perform CT guided bone marrow biopsy for tissue diagnostic purposes.  EXAM: CT GUIDED BONE MARROW BIOPSY AND ASPIRATION.  MEDICATIONS: Fentanyl 75 mcg IV; Versed 1.5 mg IV  ANESTHESIA/SEDATION: Sedation Time  8 minutes  CONTRAST:  None  COMPLICATIONS: None immediate.  PROCEDURE: Informed consent was obtained from the patient following an explanation of the procedure, risks, benefits and alternatives. The patient understands, agrees and consents for the procedure. All questions were addressed. A time out was performed prior to the initiation of the procedure. The patient was positioned prone and non-contrast localization CT was performed of the pelvis to demonstrate the iliac marrow spaces. The operative site was prepped and draped in the usual sterile fashion.  Under sterile conditions and local anesthesia, a 22 gauge spinal needle was utilized for procedural planning. Next, an 11 gauge coaxial bone biopsy needle was advanced into the left iliac marrow space. Needle position was confirmed with CT imaging. Initially, bone marrow aspiration was performed. Next, a bone marrow biopsy was obtained with the 11 gauge outer bone marrow device. The 11 gauge coaxial bone biopsy needle was re-advanced into a slightly different location within the left iliac marrow space, positioning was confirmed  and an additional bone marrow biopsy was obtained. Samples were prepared with the cytotechnologist and deemed adequate. The needle was removed intact. Hemostasis was obtained with compression and a dressing was placed. The patient tolerated the procedure well without immediate post procedural complication.  IMPRESSION: Successful CT guided left iliac bone marrow aspiration and core biopsies.   Electronically Signed   By: Sandi Mariscal M.D.   On: 03/15/2014 11:52   Dg Abd 2 Views  03/13/2014   CLINICAL DATA:  Abdominal distention with swelling around the colostomy site with abdominal pain for 3-4 weeks  EXAM: ABDOMEN - 2 VIEW  COMPARISON:  None.  FINDINGS: There is increased stool burden throughout the colon. There is no large or small bowel obstructive pattern. No free extraluminal gas collections are demonstrated  There is moderate levo curvature of the mid lumbar spine.  IMPRESSION: Increased stool burden throughout the colon is consistent with clinical constipation.   Electronically Signed   By: David  Martinique   On: 03/13/2014 16:53    I review her peripheral smear and preliminary bone marrow with the pathologist. I definitely agree with increased abnormal lymphocytes in her peripheral smear. Her bone marrow appeared to be packed with cancer cells.  Assessment/Plan: 80 y.o. female admitted with   Anemia This is likely anemia of chronic disease, In the setting of B-12 deficiency, iron deficiency, as well as elevated TSH suggestive of hypothyroidism, and congestive failure. Patient denies any recent history of bleeding such as epistaxis, hematuria or hematochezia Hemoccult is negative As she was symptomatic for anemia on admission, she received 2 units of blood with good response. Continue with B-12 supplements transfuse  for Hb of 7 or if symptomatic  monitor with daily CBC and differential  Leukocytosis On admission, she was noted to have elevated white count at 39,000, currently at 29,000. She  also was noted to have elevated ANC and monocytes Smear has been ordered for review, 2 rule out blasts in the film The possibility of leukemia will need to be ruled out.  Bone marrow biopsy was performed on 03/15/2014, flow cytometry and cytogenetics are performed We will await the bone marrow biopsy results to confirm an underlying bone marrow process Monitor her counts daily with differential  Thrombocytopenia Secondary to leukocytosis The patient was on no aspirin or NSAIDs Avoid heparin products As her platelets are less than 50,000 Consider transfusion if her platelets are less than 10,000, or 20,000 if bleeding  Chronic Systolic and diastolic Heart Failure This is followed by the primary team No ascites was noted at this time  She is frail. However, depending on the results of the bone marrow biopsy, there may be some treatment available for her. The patient is uncertain whether she would wants to pursue systemic chemotherapy. I will return tomorrow to discuss further with the patient. In the meantime, I recommend keeping her overnight one more day until we have more test results available tomorrow. Other medical issues as per admitting team.   Rondel Jumbo, PA-C 03/15/2014 1:05 PM [place], Bryton Romagnoli, MD 03/15/2014

## 2014-03-16 ENCOUNTER — Other Ambulatory Visit: Payer: Self-pay | Admitting: Hematology and Oncology

## 2014-03-16 DIAGNOSIS — C9200 Acute myeloblastic leukemia, not having achieved remission: Principal | ICD-10-CM

## 2014-03-16 DIAGNOSIS — E039 Hypothyroidism, unspecified: Secondary | ICD-10-CM | POA: Diagnosis present

## 2014-03-16 LAB — CBC WITH DIFFERENTIAL/PLATELET
BAND NEUTROPHILS: 0 % (ref 0–10)
Basophils Absolute: 0 10*3/uL (ref 0.0–0.1)
Basophils Relative: 0 % (ref 0–1)
EOS ABS: 0 10*3/uL (ref 0.0–0.7)
EOS PCT: 0 % (ref 0–5)
HCT: 26.2 % — ABNORMAL LOW (ref 36.0–46.0)
HEMOGLOBIN: 8.5 g/dL — AB (ref 12.0–15.0)
Lymphocytes Relative: 19 % (ref 12–46)
Lymphs Abs: 0 10*3/uL — ABNORMAL LOW (ref 0.7–4.0)
MCH: 31.4 pg (ref 26.0–34.0)
MCHC: 32.4 g/dL (ref 30.0–36.0)
MCV: 96.7 fL (ref 78.0–100.0)
MONOS PCT: 5 % (ref 3–12)
MYELOCYTES: 1 %
Monocytes Absolute: 0 10*3/uL — ABNORMAL LOW (ref 0.1–1.0)
Neutrophils Relative %: 15 % — ABNORMAL LOW (ref 43–77)
OTHER: 60 %
Platelets: 39 10*3/uL — ABNORMAL LOW (ref 150–400)
RBC: 2.71 MIL/uL — AB (ref 3.87–5.11)
RDW: 25 % — ABNORMAL HIGH (ref 11.5–15.5)
WBC: 26.4 10*3/uL — ABNORMAL HIGH (ref 4.0–10.5)
nRBC: 19 /100 WBC — ABNORMAL HIGH

## 2014-03-16 LAB — TYPE AND SCREEN
ABO/RH(D): A NEG
Antibody Screen: NEGATIVE
UNIT DIVISION: 0
Unit division: 0

## 2014-03-16 LAB — PROCALCITONIN: Procalcitonin: 0.1 ng/mL

## 2014-03-16 LAB — BASIC METABOLIC PANEL
ANION GAP: 11 (ref 5–15)
BUN: 15 mg/dL (ref 6–23)
CO2: 21 meq/L (ref 19–32)
Calcium: 8.2 mg/dL — ABNORMAL LOW (ref 8.4–10.5)
Chloride: 104 mEq/L (ref 96–112)
Creatinine, Ser: 0.86 mg/dL (ref 0.50–1.10)
GFR calc Af Amer: 66 mL/min — ABNORMAL LOW (ref >90)
GFR calc non Af Amer: 57 mL/min — ABNORMAL LOW (ref >90)
Glucose, Bld: 110 mg/dL — ABNORMAL HIGH (ref 70–99)
Potassium: 3.9 mEq/L (ref 3.7–5.3)
Sodium: 136 mEq/L — ABNORMAL LOW (ref 137–147)

## 2014-03-16 LAB — URIC ACID: Uric Acid, Serum: 7.1 mg/dL — ABNORMAL HIGH (ref 2.4–7.0)

## 2014-03-16 LAB — T3: T3, Total: 33 ng/dl — ABNORMAL LOW (ref 80.0–204.0)

## 2014-03-16 LAB — T3, FREE: T3, Free: 0.9 pg/mL — ABNORMAL LOW (ref 2.3–4.2)

## 2014-03-16 LAB — HEPATITIS B SURFACE ANTIGEN: Hepatitis B Surface Ag: NEGATIVE

## 2014-03-16 LAB — T4, FREE: Free T4: 0.23 ng/dL — ABNORMAL LOW (ref 0.80–1.80)

## 2014-03-16 LAB — HEPATITIS B SURFACE ANTIBODY,QUALITATIVE: Hep B S Ab: NEGATIVE

## 2014-03-16 LAB — GLUCOSE, CAPILLARY: Glucose-Capillary: 96 mg/dL (ref 70–99)

## 2014-03-16 LAB — HEPATITIS B CORE ANTIBODY, TOTAL: HEP B C TOTAL AB: NONREACTIVE

## 2014-03-16 MED ORDER — ALPRAZOLAM 0.5 MG PO TABS: 0.5000 mg | ORAL_TABLET | Freq: Every morning | ORAL | Status: DC

## 2014-03-16 MED ORDER — ALPRAZOLAM 0.25 MG PO TABS
0.2500 mg | ORAL_TABLET | Freq: Every morning | ORAL | Status: DC
  Administered 2014-03-16 – 2014-03-17 (×2): 0.25 mg via ORAL
  Filled 2014-03-16 (×2): qty 1

## 2014-03-16 MED ORDER — LEVOTHYROXINE SODIUM 25 MCG PO TABS
12.5000 ug | ORAL_TABLET | Freq: Every day | ORAL | Status: DC
  Administered 2014-03-16 – 2014-03-19 (×4): 12.5 ug via ORAL
  Filled 2014-03-16 (×4): qty 0.5

## 2014-03-16 MED ORDER — ALPRAZOLAM 0.25 MG PO TABS: 0.2500 mg | ORAL_TABLET | Freq: Every day | ORAL | Status: DC

## 2014-03-16 MED ORDER — ALPRAZOLAM 0.25 MG PO TABS
0.1250 mg | ORAL_TABLET | Freq: Every day | ORAL | Status: DC
  Administered 2014-03-16: 0.125 mg via ORAL
  Filled 2014-03-16: qty 1

## 2014-03-16 NOTE — Progress Notes (Signed)
[name]   DOB:1920-12-09   ME#:[id number]   [facility]#:[id number]  Patient Care Team: Donnie Coffin, MD as PCP - General (Family Medicine) I have seen the patient, examined her and edited the notes as follows  Subjective: No new issues overnight. Denies any shortness of breath or chest pain, no palpitations. Denies any fever, chills or night sweats. Denies any abdominal pain. No bleeding issues reported.   Scheduled Meds: . amLODipine  5 mg Oral Daily  . atenolol  25 mg Oral QHS  . docusate sodium  100 mg Oral BID  . ferrous sulfate  325 mg Oral QPC breakfast  . fluticasone  2 spray Each Nare Daily  . folic acid  1 mg Oral Daily  . furosemide  20 mg Oral Daily  . loratadine  10 mg Oral Daily  . multivitamin with minerals  1 tablet Oral Daily  . sodium chloride  3 mL Intravenous Q12H  . thiamine  100 mg Oral Daily   Continuous Infusions:  PRN Meds:ALPRAZolam, bisacodyl, naphazoline, ondansetron **OR** ondansetron (ZOFRAN) IV   Objective:  Filed Vitals:   03/16/14 0445  BP: 123/45  Pulse: 55  Temp: 98.4 F (36.9 C)  Resp: 18      Intake/Output Summary (Last 24 hours) at 03/16/14 0818 Last data filed at 03/15/14 1900  Gross per 24 hour  Intake    720 ml  Output      0 ml  Net    720 ml    ECOG PERFORMANCE STATUS: 2  GENERAL:alert, no distress and comfortable SKIN: skin color is pale, texture, turgor are normal, no rashes or significant lesions. Noted significant skin bruising but no petechiae. EYES: normal, conjunctiva are pale and non-injected, sclera clear OROPHARYNX:no exudate, no erythema and lips, buccal mucosa, and tongue normal  NECK: supple, thyroid normal size, non-tender, without nodularity LYMPH: no palpable lymphadenopathy in the cervical, axillary or inguinal LUNGS: clear to auscultation and percussion with normal breathing effort HEART: regular rate & rhythm and no murmurs and trace of lower extremity edema ABDOMEN:abdomen soft, non-tender and normal  bowel sounds. Left lower quadrant colostomy with some stool. Musculoskeletal:no cyanosis of digits and no clubbing  PSYCH: alert & oriented x 3 with fluent speech NEURO: no focal motor/sensory deficits     CBG (last 3)   Recent Labs  03/14/14 0718 03/15/14 0740 03/16/14 0739  GLUCAP 108* 102* 96     Labs:   Recent Labs Lab 03/13/14 2020 03/14/14 1000 03/15/14 0610 03/16/14 0433  WBC 39.1* 21.6* 29.2* 26.4*  HGB 6.8* 8.7* 8.7* 8.5*  HCT 20.6* 26.3* 26.5* 26.2*  PLT 50* 39* 45* 39*  MCV 102.5* 94.3 95.7 96.7  MCH 33.8 31.2 31.4 31.4  MCHC 33.0 33.1 32.8 32.4  RDW 28.7* 23.6* 24.5* 25.0*  LYMPHSABS 1.6  --   --  0.0*  MONOABS 7.8*  --   --  0.0*  EOSABS 0.0  --   --  0.0  BASOSABS 0.0  --   --  0.0     Chemistries:    Recent Labs Lab 03/13/14 2020 03/14/14 1000 03/15/14 0610 03/16/14 0433  NA 138 136* 139 136*  K 3.4* 3.4* 3.6* 3.9  CL 99 100 104 104  CO2 _0 GLUCOSE 125* 174* 106* 110*  BUN 26* _1 CREATININE 1.12* 0.92 0.92 0.86  CALCIUM 9.1 8.2* 8.3* 8.2*  AST 31 27  --   --   ALT 20 17  --   --  ALKPHOS 67 57  --   --   BILITOT 0.5 0.6  --   --     GFR Estimated Creatinine Clearance: 40.1 mL/min (by C-G formula based on Cr of 0.86).  Liver Function Tests:  Recent Labs Lab 03/13/14 2020 03/14/14 1000  AST 31 27  ALT 20 17  ALKPHOS 67 57  BILITOT 0.5 0.6  PROT 8.0 6.9  ALBUMIN 3.7 3.2*   No results for input(s): LIPASE, AMYLASE in the last 168 hours. No results for input(s): AMMONIA in the last 168 hours.  Urine Studies     Component Value Date/Time   COLORURINE YELLOW 03/13/2014 2126   APPEARANCEUR CLEAR 03/13/2014 2126   LABSPEC 1.017 03/13/2014 2126   PHURINE 6.5 03/13/2014 2126   GLUCOSEU NEGATIVE 03/13/2014 2126   HGBUR NEGATIVE 03/13/2014 2126   BILIRUBINUR NEGATIVE 03/13/2014 2126   KETONESUR NEGATIVE 03/13/2014 2126   PROTEINUR 30* 03/13/2014 2126   UROBILINOGEN 1.0 03/13/2014 2126   NITRITE  NEGATIVE 03/13/2014 2126   LEUKOCYTESUR NEGATIVE 03/13/2014 2126    Coagulation profile  Recent Labs Lab 03/14/14 1400  INR 1.12    Cardiac Enzymes:  Recent Labs Lab 03/13/14 2020  TROPONINI <0.30   BNP: Invalid input(s): POCBNP CBG:  Recent Labs Lab 03/14/14 0718 03/15/14 0740 03/16/14 0739  GLUCAP 108* 102* 96   D-Dimer No results for input(s): DDIMER in the last 72 hours. Hgb A1c  Recent Labs  03/14/14 1000  HGBA1C 6.1*   Lipid Profile No results for input(s): CHOL, HDL, LDLCALC, TRIG, CHOLHDL, LDLDIRECT in the last 72 hours. Thyroid function studies  Recent Labs  03/14/14 1000  TSH 81.510*   Microbiology Cultures negative to date   Imaging Studies:  Ct Biopsy  03/15/2014   INDICATION: Unspecified anemia, now with leukocytosis. Please perform CT guided bone marrow biopsy for tissue diagnostic purposes.  EXAM: CT GUIDED BONE MARROW BIOPSY AND ASPIRATION.  MEDICATIONS: Fentanyl 75 mcg IV; Versed 1.5 mg IV  ANESTHESIA/SEDATION: Sedation Time  8 minutes  CONTRAST:  None  COMPLICATIONS: None immediate.  PROCEDURE: Informed consent was obtained from the patient following an explanation of the procedure, risks, benefits and alternatives. The patient understands, agrees and consents for the procedure. All questions were addressed. A time out was performed prior to the initiation of the procedure. The patient was positioned prone and non-contrast localization CT was performed of the pelvis to demonstrate the iliac marrow spaces. The operative site was prepped and draped in the usual sterile fashion.  Under sterile conditions and local anesthesia, a 22 gauge spinal needle was utilized for procedural planning. Next, an 11 gauge coaxial bone biopsy needle was advanced into the left iliac marrow space. Needle position was confirmed with CT imaging. Initially, bone marrow aspiration was performed. Next, a bone marrow biopsy was obtained with the 11 gauge outer bone marrow  device. The 11 gauge coaxial bone biopsy needle was re-advanced into a slightly different location within the left iliac marrow space, positioning was confirmed and an additional bone marrow biopsy was obtained. Samples were prepared with the cytotechnologist and deemed adequate. The needle was removed intact. Hemostasis was obtained with compression and a dressing was placed. The patient tolerated the procedure well without immediate post procedural complication.  IMPRESSION: Successful CT guided left iliac bone marrow aspiration and core biopsies.   Electronically Signed   By: Sandi Mariscal M.D.   On: 03/15/2014 11:52    Assessment/Plan: 80 y.o. Acute leukemia I have discussed with the pathologist today.  Preliminary bone marrow biopsy showed 50-75% blasts. Subtyping of exact acute myelogenous leukemia is pending. I discussed with the patient treatment options and she declined. She once palliative care and hospice. She does not one transfusion support. I'll change her CODE STATUS to DO NOT RESUSCITATE, consult the palliative care and placement to [facility]/hospice facility.  Chronic Systolic and diastolic Heart Failure This is followed by the primary team No ascites was noted at this time  Will sign of. Call if questions arise.  **Disclaimer: This note was dictated with voice recognition software. Similar sounding words can inadvertently be transcribed and this note may contain transcription errors which may not have been corrected upon publication of note.** WERTMAN,SARA E, PA-C 03/16/2014  8:18 AM Rylann Munford, MD 03/16/2014 Spent 35 minutes with patient and family and addressed all questions.

## 2014-03-16 NOTE — Progress Notes (Signed)
PT Cancellation Note  Patient Details Name: [name] MRN: [id number] DOB: [birth year]   Cancelled Treatment:    Reason Eval/Treat Not Completed: Attempted PT tx session-pt declined to participate at this time. Will likely have to check back another day/time.    Weston Anna, MPT Pager: [phone]

## 2014-03-16 NOTE — Progress Notes (Signed)
UR completed 

## 2014-03-16 NOTE — Progress Notes (Signed)
[facility] PROGRESS NOTE  SOLA MARGOLIS TRV:[id number] DOB: Apr 19, 1921 DOA: 03/13/2014 PCP: Donnie Coffin, MD  INTERIM SUMMARY.  80 year old lady with h/o chronic diastolic heart failure, anemia, PAF, hypertension, was sent to ED for hemoglobin less than 7. She was transfused two unit of prbc and referred to medical service for admission.     Assessment/Plan: #1 symptomatic anemia Unknown etiology.FOBT is negative. Concern for myelodysplastic syndrome/probable leukemia. Patient is status post 2 units packed red blood cells with clinical improvement. Hemoglobin currently at 8.5 from 6.8 on admission. Patient is status post bone marrow biopsy with results pending. Hematology/Oncology ff.  #2 leukocytosis/probable leukemia Questionable etiology. Peripheral smear with atypical mononuclear cells worrisome for a lymphoproliferative process versus a leukemic process. Urinalysis is negative for UTI. Patient is afebrile. Chest x-ray negative for pneumonia. Patient has been seen by hematology oncology and concern is for leukemia. Bone marrow biopsy pending. Hematology/ oncology following and appreciate their input recommendations.  #3 thrombocytopenia Likely secondary to leukocytosis/probable leukemia. LDH elevated at 637. Patient with no signs of bleeding. Follow platelets. Hematology/oncology ff.  #4 hypokalemia Repleted.  #5 chronic diastolic heart failure Stable. Compensated. Continue home regimen of atenolol, Norvasc, Lasix.  #6 hypertension Stable. Continue atenolol, Lasix, Norvasc.  #7 Hypothyroidism TSH = 81.510. Check free T4 and T3. Start synthroid 12.70mg daily.  #8 history of small bowel obstruction status post colostomy 2014 Stool in colostomy bag. Continue ostomy care.  #8 paroxysmal atrial fibrillation Rate controlled on atenolol. Follow.  #9 prophylaxis SCDs for DVT prophylaxis.   Code Status: full Family Communication: updated patient no family at  bedside. Disposition Plan: home when medically stable.   Consultants:  Interventional radiology: Dr. WPascal Lux Hematology/oncology Dr. GAlvy Bimler11/18/2015  Procedures:  CT-guided bone marrow biopsy 03/15/2014 per Dr. WPascal Luxinterventional radiology  Chest x-ray 03/13/2014  Abdominal x-ray 03/13/2014  2 units packed red blood cells 03/13/2014,03/14/2014  Antibiotics:  none  HPI/Subjective: Patient alert. Patient asking for home regimen xanax. No complaints.  Objective: Filed Vitals:   03/16/14 0445  BP: 123/45  Pulse: 55  Temp: 98.4 F (36.9 C)  Resp: 18    Intake/Output Summary (Last 24 hours) at 03/16/14 1045 Last data filed at 03/16/14 0855  Gross per 24 hour  Intake    960 ml  Output      0 ml  Net    960 ml   Filed Weights   03/13/14 2306 03/15/14 0637  Weight: 69.264 kg (152 lb 11.2 oz) 67.042 kg (147 lb 12.8 oz)    Exam:   General:  NAD  Cardiovascular: RRR  Respiratory: clear to auscultation bilaterally.  Abdomen: soft, nontender, nondistended, positive bowel sounds.Colostomy bag intact with stool.  Musculoskeletal: no clubbing cyanosis or edema.  Data Reviewed: Basic Metabolic Panel:  Recent Labs Lab 03/13/14 2020 03/14/14 1000 03/15/14 0610 03/16/14 0433  NA 138 136* 139 136*  K 3.4* 3.4* 3.6* 3.9  CL 99 100 104 104  CO2 23 20 21 21   GLUCOSE 125* 174* 106* 110*  BUN 26* 18 15 15   CREATININE 1.12* 0.92 0.92 0.86  CALCIUM 9.1 8.2* 8.3* 8.2*   Liver Function Tests:  Recent Labs Lab 03/13/14 2020 03/14/14 1000  AST 31 27  ALT 20 17  ALKPHOS 67 57  BILITOT 0.5 0.6  PROT 8.0 6.9  ALBUMIN 3.7 3.2*   No results for input(s): LIPASE, AMYLASE in the last 168 hours. No results for input(s): AMMONIA in the last 168 hours. CBC:  Recent Labs  Lab 03/13/14 2020 03/14/14 1000 03/15/14 0610 03/16/14 0433  WBC 39.1* 21.6* 29.2* 26.4*  NEUTROABS 29.7*  --   --   --   HGB 6.8* 8.7* 8.7* 8.5*  HCT 20.6* 26.3* 26.5* 26.2*  MCV  102.5* 94.3 95.7 96.7  PLT 50* 39* 45* 39*   Cardiac Enzymes:  Recent Labs Lab 03/13/14 2020  TROPONINI <0.30   BNP (last 3 results)  Recent Labs  04/03/13 0949 04/05/13 0500 03/13/14 2020  PROBNP 13219.0* 3467.0* 1898.0*   CBG:  Recent Labs Lab 03/14/14 0718 03/15/14 0740 03/16/14 0739  GLUCAP 108* 102* 96    Recent Results (from the past 240 hour(s))  MRSA PCR Screening     Status: None   Collection Time: 03/13/14 11:29 PM  Result Value Ref Range Status   MRSA by PCR NEGATIVE NEGATIVE Final    Comment:        The GeneXpert MRSA Assay (FDA approved for NASAL specimens only), is one component of a comprehensive MRSA colonization surveillance program. It is not intended to diagnose MRSA infection nor to guide or monitor treatment for MRSA infections.   Clostridium Difficile by PCR     Status: None   Collection Time: 03/14/14  1:48 PM  Result Value Ref Range Status   C difficile by pcr NEGATIVE NEGATIVE Final    Comment: Performed at [facility]     Studies: Ct Biopsy  03/15/2014   INDICATION: Unspecified anemia, now with leukocytosis. Please perform CT guided bone marrow biopsy for tissue diagnostic purposes.  EXAM: CT GUIDED BONE MARROW BIOPSY AND ASPIRATION.  MEDICATIONS: Fentanyl 75 mcg IV; Versed 1.5 mg IV  ANESTHESIA/SEDATION: Sedation Time  8 minutes  CONTRAST:  None  COMPLICATIONS: None immediate.  PROCEDURE: Informed consent was obtained from the patient following an explanation of the procedure, risks, benefits and alternatives. The patient understands, agrees and consents for the procedure. All questions were addressed. A time out was performed prior to the initiation of the procedure. The patient was positioned prone and non-contrast localization CT was performed of the pelvis to demonstrate the iliac marrow spaces. The operative site was prepped and draped in the usual sterile fashion.  Under sterile conditions and local anesthesia, a 22 gauge  spinal needle was utilized for procedural planning. Next, an 11 gauge coaxial bone biopsy needle was advanced into the left iliac marrow space. Needle position was confirmed with CT imaging. Initially, bone marrow aspiration was performed. Next, a bone marrow biopsy was obtained with the 11 gauge outer bone marrow device. The 11 gauge coaxial bone biopsy needle was re-advanced into a slightly different location within the left iliac marrow space, positioning was confirmed and an additional bone marrow biopsy was obtained. Samples were prepared with the cytotechnologist and deemed adequate. The needle was removed intact. Hemostasis was obtained with compression and a dressing was placed. The patient tolerated the procedure well without immediate post procedural complication.  IMPRESSION: Successful CT guided left iliac bone marrow aspiration and core biopsies.   Electronically Signed   By: Sandi Mariscal M.D.   On: 03/15/2014 11:52    Scheduled Meds: . ALPRAZolam  0.125 mg Oral QHS  . ALPRAZolam  0.25 mg Oral q morning - 10a  . amLODipine  5 mg Oral Daily  . atenolol  25 mg Oral QHS  . docusate sodium  100 mg Oral BID  . ferrous sulfate  325 mg Oral QPC breakfast  . fluticasone  2 spray Each Nare Daily  .  folic acid  1 mg Oral Daily  . furosemide  20 mg Oral Daily  . levothyroxine  12.5 mcg Oral QAC breakfast  . loratadine  10 mg Oral Daily  . multivitamin with minerals  1 tablet Oral Daily  . sodium chloride  3 mL Intravenous Q12H  . thiamine  100 mg Oral Daily   Continuous Infusions:   Principal Problem:   Symptomatic anemia Active Problems:   Leukocytosis   HTN (hypertension)   CHF (congestive heart failure)   Chronic kidney disease (CKD), stage III (moderate)   Anemia   Weakness   Thrombocytopenia   Hypothyroidism    Time spent: [facility] Pager [phone]. If 7PM-7AM, please contact night-coverage at [email or web address], password [facility] 03/16/2014,  10:45 AM  LOS: 3 days

## 2014-03-16 NOTE — Plan of Care (Signed)
Problem: Phase I Progression Outcomes Goal: OOB as tolerated unless otherwise ordered Outcome: Completed/Met Date Met:  03/16/14 Goal: Other Phase I Outcomes/Goals Outcome: Completed/Met Date Met:  03/16/14  Problem: Phase II Progression Outcomes Goal: Progress activity as tolerated unless otherwise ordered Outcome: Progressing Goal: Discharge plan established Outcome: Completed/Met Date Met:  03/16/14 Goal: IV changed to normal saline lock Outcome: Completed/Met Date Met:  03/16/14 Goal: Other Phase II Outcomes/Goals Outcome: Completed/Met Date Met:  03/16/14  Problem: Phase III Progression Outcomes Goal: Pain controlled on oral analgesia Outcome: Completed/Met Date Met:  03/16/14 Goal: Activity at appropriate level-compared to baseline (UP IN CHAIR FOR HEMODIALYSIS)  Outcome: Completed/Met Date Met:  03/16/14 Goal: Voiding independently Outcome: Completed/Met Date Met:  03/16/14 Goal: IV/normal saline lock discontinued Outcome: Completed/Met Date Met:  03/16/14 Goal: Discharge plan remains appropriate-arrangements made Outcome: Completed/Met Date Met:  03/16/14 Goal: Other Phase III Outcomes/Goals Outcome: Completed/Met Date Met:  03/16/14

## 2014-03-17 LAB — GLUCOSE, CAPILLARY: GLUCOSE-CAPILLARY: 104 mg/dL — AB (ref 70–99)

## 2014-03-17 MED ORDER — LORAZEPAM 2 MG/ML IJ SOLN: 0.5000 mg | INTRAMUSCULAR | Status: DC | PRN

## 2014-03-17 MED ORDER — LORAZEPAM 2 MG/ML PO CONC
1.0000 mg | Freq: Every day | ORAL | Status: DC
  Administered 2014-03-17 – 2014-03-18 (×2): 1 mg via ORAL
  Filled 2014-03-17 (×2): qty 1

## 2014-03-17 MED ORDER — ALPRAZOLAM 0.5 MG PO TABS: 0.5000 mg | ORAL_TABLET | Freq: Every morning | ORAL | Status: DC

## 2014-03-17 MED ORDER — ALPRAZOLAM 0.25 MG PO TABS: 0.2500 mg | ORAL_TABLET | Freq: Every day | ORAL | Status: DC

## 2014-03-17 MED ORDER — GI COCKTAIL ~~LOC~~
30.0000 mL | Freq: Three times a day (TID) | ORAL | Status: DC | PRN
  Filled 2014-03-17: qty 30

## 2014-03-17 MED ORDER — MORPHINE SULFATE 2 MG/ML IJ SOLN: 1.0000 mg | INTRAMUSCULAR | Status: DC | PRN

## 2014-03-17 MED ORDER — ALPRAZOLAM 0.25 MG PO TABS
0.2500 mg | ORAL_TABLET | Freq: Two times a day (BID) | ORAL | Status: DC | PRN
Start: 2014-03-17 — End: 2014-03-17

## 2014-03-17 MED ORDER — ZOLPIDEM TARTRATE 5 MG PO TABS
5.0000 mg | ORAL_TABLET | Freq: Every evening | ORAL | Status: DC | PRN
  Administered 2014-03-17 – 2014-03-18 (×2): 5 mg via ORAL
  Filled 2014-03-17 (×2): qty 1

## 2014-03-17 MED ORDER — ALPRAZOLAM 0.25 MG PO TABS
0.2500 mg | ORAL_TABLET | Freq: Once | ORAL | Status: AC
  Administered 2014-03-17: 0.25 mg via ORAL
  Filled 2014-03-17: qty 1

## 2014-03-17 MED ORDER — CALCIUM CARBONATE ANTACID 500 MG PO CHEW
400.0000 mg | CHEWABLE_TABLET | Freq: Three times a day (TID) | ORAL | Status: DC | PRN
  Administered 2014-03-17: 400 mg via ORAL
  Filled 2014-03-17: qty 1

## 2014-03-17 MED ORDER — ACETAMINOPHEN 325 MG PO TABS
650.0000 mg | ORAL_TABLET | Freq: Three times a day (TID) | ORAL | Status: DC
  Administered 2014-03-17 – 2014-03-19 (×6): 650 mg via ORAL
  Filled 2014-03-17 (×6): qty 2

## 2014-03-17 MED ORDER — PSEUDOEPHEDRINE HCL ER 120 MG PO TB12
120.0000 mg | ORAL_TABLET | Freq: Two times a day (BID) | ORAL | Status: DC
  Administered 2014-03-17 – 2014-03-19 (×5): 120 mg via ORAL
  Filled 2014-03-17 (×5): qty 1

## 2014-03-17 MED ORDER — ACETAMINOPHEN 325 MG PO TABS: 650.0000 mg | ORAL_TABLET | ORAL | Status: DC | PRN

## 2014-03-17 NOTE — Progress Notes (Signed)
PT Cancellation Note  Patient Details Name: [name] MRN: [id number] DOB: Sep 20, 1920   Cancelled Treatment:    Reason Eval/Treat Not Completed: Spoke with pt's family member (daughter/daughter n law) who reports pt is to possibly transfer to [facility] if a bed becomes available.  Family states they can walk with pt if/when she chooses to walk. At this time, PT will sign off. Please reorder if needed.    Weston Anna, MPT Pager: [phone]

## 2014-03-17 NOTE — Progress Notes (Signed)
[facility] PROGRESS NOTE  [name]:[id number] DOB: Apr 04, 1921 DOA: 03/13/2014 PCP: Donnie Coffin, MD  INTERIM SUMMARY.  80 year old lady with h/o chronic diastolic heart failure, anemia, PAF, hypertension, was sent to ED for hemoglobin less than 7. She was transfused two unit of prbc and referred to medical service for admission.     Assessment/Plan:  #1 probable acute myeloid leukemia Patient had presented with a significant leukocytosis. Peripheral smear showed atypical mononuclear cells worrisome for some for lymphoproliferative process versus a leukemic process. Urinalysis which was done was negative. Patient remained afebrile. Chest x-ray which was done was negative for any acute infiltrate. Bone marrow biopsy was subsequently done. Hematology oncology consultation was obtained and patient was seen in consultation by Dr. Alvy Bimler. Preliminary bone marrow biopsy did show 50-75% blasts. Treatment options were discussed with patient by oncology and patient declined any treatment options. Patient wanted palliative care and hospice. Patient does have a poor prognosis and likely needs a hospice facility/[facility]. Palliative care consultation is pending.  #2 symptomatic anemia Unknown etiology.FOBT is negative. Concern for myelodysplastic syndrome/probable leukemia. Patient is status post 2 units packed red blood cells with clinical improvement. Hemoglobin currently at 8.5 from 6.8 on admission. Patient is status post bone marrow biopsy with results pending. Hematology/Oncology ff.  #3 thrombocytopenia Likely secondary to acute myeloid leukemia. LDH elevated at 637. Patient with no signs of bleeding. Platelet count at 39 today from 45. Hematology/oncology ff.  #4 hypokalemia Repleted.  #5 chronic diastolic heart failure Stable. Compensated. Continue home regimen of atenolol, Norvasc, Lasix.  #6 hypertension Stable. Continued on a home regimen of atenolol, Lasix,  Norvasc.  #7 Hypothyroidism TSH = 81.510. Free T4 and T3 within normal limits. Continue current dose of Synthroid.   #8 history of small bowel obstruction status post colostomy 2014 Stool in colostomy bag. Continue ostomy care.  #8 paroxysmal atrial fibrillation Rate controlled on atenolol. Follow.  #9 prophylaxis SCDs for DVT prophylaxis.  #10 prognosis Patient diagnosed during this hospitalization with acute myeloid leukemia per preliminary bone marrow biopsy which showed 50-75% blasts per oncology. Treatment options were discussed by oncology with patient and family and patient declined treatment at this time. Patient wanted palliative care and hospice. Patient did not want transfusion support. Patient is currently DO NOT RESUSCITATE. Patient with a poor prognosis, and is recommended that patient be placed in a hospice facility per oncology. Palliative care consultation is currently pending.   Code Status: full Family Communication: updated patient and son and daughter-in-law at bedside. Disposition Plan: Hospice facility, pending palliative care meeting/consultation.   Consultants:  Interventional radiology: Dr. Pascal Lux  Hematology/oncology Dr. Alvy Bimler 03/15/2014  Palliative care pending  Procedures:  CT-guided bone marrow biopsy 03/15/2014 per Dr. Pascal Lux interventional radiology  Chest x-ray 03/13/2014  Abdominal x-ray 03/13/2014  2 units packed red blood cells 03/13/2014,03/14/2014  Antibiotics:  none  HPI/Subjective: Patient alert. Patient states unable to sleep last night. Patient complaining of anxiety and once the Xanax dose increased. Patient denies any bleeding.  Objective: Filed Vitals:   03/17/14 1324  BP: 125/44  Pulse: 107  Temp: 98.5 F (36.9 C)  Resp: 18    Intake/Output Summary (Last 24 hours) at 03/17/14 1514 Last data filed at 03/17/14 1408  Gross per 24 hour  Intake    720 ml  Output      0 ml  Net    720 ml   Filed Weights    03/13/14 2306 03/15/14 0637 03/17/14 0440  Weight:  69.264 kg (152 lb 11.2 oz) 67.042 kg (147 lb 12.8 oz) 66.452 kg (146 lb 8 oz)    Exam:   General:  NAD  Cardiovascular: RRR  Respiratory: clear to auscultation bilaterally.  Abdomen: soft, nontender, nondistended, positive bowel sounds.Colostomy bag intact with stool.  Musculoskeletal: no clubbing cyanosis or edema.  Data Reviewed: Basic Metabolic Panel:  Recent Labs Lab 03/13/14 2020 03/14/14 1000 03/15/14 0610 03/16/14 0433  NA 138 136* 139 136*  K 3.4* 3.4* 3.6* 3.9  CL 99 100 104 104  CO2 23 20 21 21   GLUCOSE 125* 174* 106* 110*  BUN 26* 18 15 15   CREATININE 1.12* 0.92 0.92 0.86  CALCIUM 9.1 8.2* 8.3* 8.2*   Liver Function Tests:  Recent Labs Lab 03/13/14 2020 03/14/14 1000  AST 31 27  ALT 20 17  ALKPHOS 67 57  BILITOT 0.5 0.6  PROT 8.0 6.9  ALBUMIN 3.7 3.2*   No results for input(s): LIPASE, AMYLASE in the last 168 hours. No results for input(s): AMMONIA in the last 168 hours. CBC:  Recent Labs Lab 03/13/14 2020 03/14/14 1000 03/15/14 0610 03/16/14 0433  WBC 39.1* 21.6* 29.2* 26.4*  NEUTROABS 29.7*  --   --   --   HGB 6.8* 8.7* 8.7* 8.5*  HCT 20.6* 26.3* 26.5* 26.2*  MCV 102.5* 94.3 95.7 96.7  PLT 50* 39* 45* 39*   Cardiac Enzymes:  Recent Labs Lab 03/13/14 2020  TROPONINI <0.30   BNP (last 3 results)  Recent Labs  04/03/13 0949 04/05/13 0500 03/13/14 2020  PROBNP 13219.0* 3467.0* 1898.0*   CBG:  Recent Labs Lab 03/14/14 0718 03/15/14 0740 03/16/14 0739 03/17/14 0736  GLUCAP 108* 102* 96 104*    Recent Results (from the past 240 hour(s))  MRSA PCR Screening     Status: None   Collection Time: 03/13/14 11:29 PM  Result Value Ref Range Status   MRSA by PCR NEGATIVE NEGATIVE Final    Comment:        The GeneXpert MRSA Assay (FDA approved for NASAL specimens only), is one component of a comprehensive MRSA colonization surveillance program. It is not intended to  diagnose MRSA infection nor to guide or monitor treatment for MRSA infections.   Clostridium Difficile by PCR     Status: None   Collection Time: 03/14/14  1:48 PM  Result Value Ref Range Status   C difficile by pcr NEGATIVE NEGATIVE Final    Comment: Performed at [facility]     Studies: No results found.  Scheduled Meds: . ALPRAZolam  0.25 mg Oral QHS  . [START ON 03/18/2014] ALPRAZolam  0.5 mg Oral q morning - 10a  . amLODipine  5 mg Oral Daily  . atenolol  25 mg Oral QHS  . fluticasone  2 spray Each Nare Daily  . furosemide  20 mg Oral Daily  . levothyroxine  12.5 mcg Oral QAC breakfast  . loratadine  10 mg Oral Daily  . pseudoephedrine  120 mg Oral BID  . sodium chloride  3 mL Intravenous Q12H   Continuous Infusions:   Principal Problem:   Acute myeloid leukemia Active Problems:   Leukocytosis   HTN (hypertension)   CHF (congestive heart failure)   Chronic kidney disease (CKD), stage III (moderate)   Symptomatic anemia   Anemia   Weakness   Thrombocytopenia   Hypothyroidism    Time spent: [facility] Pager [phone]. If 7PM-7AM, please contact night-coverage at [email or web address], password  [email or web address] 03/17/2014, 3:14 PM  LOS: 4 days

## 2014-03-17 NOTE — Consult Note (Signed)
Palliative Medicine Team at [facility]  Date: 03/17/2014   Patient Name: [name]  DOB: Nov 26, 1920  MRN: [id number]  Age / Sex: 80 y.o., female   PCP: Donnie Coffin, MD Referring Physician: Eugenie Filler, MD  Active Problems: Principal Problem:   Acute myeloid leukemia Active Problems:   HTN (hypertension)   Leukocytosis   CHF (congestive heart failure)   Chronic kidney disease (CKD), stage III (moderate)   Symptomatic anemia   Anemia   Weakness   Thrombocytopenia   Hypothyroidism   HPI/Reason for Consultation: [name] is a 80 yo woman with diastolic heart failure, HTN and CKD who was admitted on 11/6 for worsening SOB, severe weakness and worsening joint pain and abdominal pain. On admission she was found to have a severely decreased Hb of 6.8 of unknown etiology, evidence of pulmonary vascular congestion. Over the course of her hospitalization (hospital day #5) she has received blood transfusion X2, IV lasix for CHF ion admission and between units of blood and also underwent a bone marrow biopsy that shows AML. PMT consulted for goals of care.  Participants in Discussion: HCPOA: yes, her SON [name]   Advance Directive:    Code Status Orders        Start     Ordered   03/16/14 1607  Do not attempt resuscitation (DNR)   Continuous    Question Answer Comment  In the event of cardiac or respiratory ARREST Do not call a "code blue"   In the event of cardiac or respiratory ARREST Do not perform Intubation, CPR, defibrillation or ACLS   In the event of cardiac or respiratory ARREST Use medication by any route, position, wound care, and other measures to relive pain and suffering. May use oxygen, suction and manual treatment of airway obstruction as needed for comfort.      03/16/14 1616      I have reviewed the medical record, interviewed the patient and family, and examined the patient. The following aspects are pertinent.  Past Medical History    Diagnosis Date  . Hypertension   . GERD (gastroesophageal reflux disease)   . Anxiety   . Arthritis   . Chronic combined systolic and diastolic heart failure   . Hepatitis A   . Bowel obstruction 04/2012    s/p colostomy 11/2012  . Chronic UTI   . Bruises easily   . Paroxysmal atrial fibrillation    History   Social History  . Marital Status: Widowed    Spouse Name: N/A    Number of Children: 1  . Years of Education: N/A   Occupational History  . retired    Social History Main Topics  . Smoking status: Former Smoker -- 6 years    Types: Cigarettes    Quit date: 11/11/1938  . Smokeless tobacco: Never Used  . Alcohol Use: No  . Drug Use: No  . Sexual Activity: None   Other Topics Concern  . None   Social History Narrative   Lives with granddaughter who is Therapist, sports at [employer].  Uses walker for ambulation.     Family History  Problem Relation Age of Onset  . Congestive Heart Failure Mother   . Stroke Father   . Colon cancer Neg Hx    Scheduled Meds: . acetaminophen  650 mg Oral TID  . amLODipine  5 mg Oral Daily  . atenolol  25 mg Oral QHS  . fluticasone  2 spray Each Nare Daily  . furosemide  20 mg Oral Daily  . levothyroxine  12.5 mcg Oral QAC breakfast  . loratadine  10 mg Oral Daily  . LORazepam  1 mg Oral QHS  . pseudoephedrine  120 mg Oral BID  . sodium chloride  3 mL Intravenous Q12H   Continuous Infusions:  PRN Meds:.bisacodyl, calcium carbonate, gi cocktail, LORazepam, morphine injection, naphazoline, ondansetron **OR** ondansetron (ZOFRAN) IV, zolpidem Allergies  Allergen Reactions  . Neosporin [Neomycin-Bacitracin Zn-Polymyx]   . Other     Depression medicines: nightmares, rash, vertigo,  Unsure of rx names, has tried 2.  . Celexa [Citalopram Hydrobromide] Rash  . Sulfa Antibiotics Rash   CBC:    Component Value Date/Time   WBC 26.4* 03/16/2014 0433   HGB 8.5* 03/16/2014 0433   HCT 26.2* 03/16/2014 0433   PLT 39* 03/16/2014 0433   MCV 96.7  03/16/2014 0433   NEUTROABS 29.7* 03/13/2014 2020   LYMPHSABS 0.0* 03/16/2014 0433   MONOABS 0.0* 03/16/2014 0433   EOSABS 0.0 03/16/2014 0433   BASOSABS 0.0 03/16/2014 0433   Comprehensive Metabolic Panel:    Component Value Date/Time   NA 136* 03/16/2014 0433   K 3.9 03/16/2014 0433   CL 104 03/16/2014 0433   CO2 21 03/16/2014 0433   BUN 15 03/16/2014 0433   CREATININE 0.86 03/16/2014 0433   GLUCOSE 110* 03/16/2014 0433   CALCIUM 8.2* 03/16/2014 0433   AST 27 03/14/2014 1000   ALT 17 03/14/2014 1000   ALKPHOS 57 03/14/2014 1000   BILITOT 0.6 03/14/2014 1000   PROT 6.9 03/14/2014 1000   ALBUMIN 3.2* 03/14/2014 1000    Vital Signs: BP 125/44 mmHg  Pulse 107  Temp(Src) 98.5 F (36.9 C) (Oral)  Resp 18  Ht 5' 4" (1.626 m)  Wt 66.452 kg (146 lb 8 oz)  BMI 25.13 kg/m2  SpO2 98% Filed Weights   03/13/14 2306 03/15/14 0637 03/17/14 0440  Weight: 69.264 kg (152 lb 11.2 oz) 67.042 kg (147 lb 12.8 oz) 66.452 kg (146 lb 8 oz)   11/19 0701 - 11/20 0700 In: 71 [P.O.:960] Out: -   Physical Exam:  AOx3, conversational, but weak-very pleasant Pale, no skin lesions, +peripheral edema Tachycardic Tachypnea  +enlarged spleen, +tenderness in abdomen, colostomy appears normal Neuro-nonfocal Pain on palpation of her left should and hip  Summary of Established Goals of Care and Medical Treatment Preferences  Primary Diagnoses  1. Newly Diagnosed Acute Myelogenous Leukemia:  70% Blasts on Bone Marrow  S/P transfusion 6.80--8.7---8.5 PLATELETS DECREASING QUICKLY now 39 2. Congestive Heart Failure- pulmonary congestion on CXR and BNP >1000  Active Symptoms: Her most intense symptoms are lack of energy and difficult sleeping. 1. Pain 2. Anxiety 3. Dyspnea  Psycho-social/Spiritual: Very strong and loving family, grand-daughter is an NT here at [facility]. Her husband died many years ago at [facility]. Her son [name] is her HCPOA.Patient does not have 24/7 supervision or care at  home.  Prognosis: . Because of the imminent bone marrow insufficiency she will likely die of hemorrhage or sepsis- <2 weeks    Palliative Performance Scale: 40%  Recommendations:  1. Code Status: DNR 2. Scope of Treatment:   Patient has received aggressive diagnostic and therapeutic interventions since her admission that required inpatient admission- severely low Hb without clear etiology required inpatient evaluation and diagnostic tests including Bone marrow biopsy and daily monitoring of her blood work which also included life threatening thrombocytopenia and leukocytosis that could have led to immediate instability. Additionally this patient was at risk for  volume overload with transfusion and required IV Lasix for Diastolic heart failure. We now have a clear diagnosis and have established goals of care given the very poor prognosis in AML.  Patient has full capacity and has chosen a purely comfort care pathway and to allow for a natural death to occur.  3. Symptom Management:   Patient having joint and abdominal pain. Her IV access has expired and been removed and for comfort I will not replace- I will instead use a subcutaneous access point if parenteral meds are needed.  She says she has not slept in days. I will give a SL dose of Ativan this evening for sleep and have subQ ativan available for back up if needed. 4. Palliative Prophylaxis:   Bowel regimen  Pain PRNs  Dyspnea PRNs  Nausea PRNs  5. Disposition:  Patient has a bed at [facility] on 11/22- will plan on discharge Sunday.   I anticipate a very rapid decline and escalation of her symptoms in the 1-3 days. Time 4:30-6PM  Time Total: 90 minutes Greater than 50%  of this time was spent counseling and coordinating care related to the above assessment and plan.  Signed by: Roma Schanz, DO  03/17/2014, 5:15 PM  Please contact Palliative Medicine Team phone at [phone] for questions  and concerns.

## 2014-03-17 NOTE — Consult Note (Signed)
[facility] Liaison: [place] room available for patient Sunday 03/19/2014. Registration paper work completed at bedside with patient and family. Patient pleased, reports she has visited others at [facility]. She was able to describe facility. Per family [name] has offered to attend with [facility] MD Orpah Melter to manage symptoms. CSW, RNCM and PMT aware of above. Will need DC summary faxed to [facility] at [phone]. RN please call report to [phone]. Please arrange transport for patient to arrive as early as possible Sunday morning. Thank you. Erling Conte LCSW [phone]

## 2014-03-17 NOTE — Progress Notes (Signed)
UR completed 

## 2014-03-18 NOTE — Progress Notes (Signed)
[facility] PROGRESS NOTE  [name] BCW:[id number] DOB: May 10, 1920 DOA: 03/13/2014 PCP: Donnie Coffin, MD  INTERIM SUMMARY.  80 year old lady with h/o chronic diastolic heart failure, anemia, PAF, hypertension, was sent to ED for hemoglobin less than 7. She was transfused two unit of prbc and referred to medical service for admission.     Assessment/Plan:  #1 Acute myeloid leukemia Patient had presented with a significant leukocytosis. Peripheral smear showed atypical mononuclear cells worrisome for some for lymphoproliferative process versus a leukemic process. Urinalysis which was done was negative. Patient remained afebrile. Chest x-ray which was done was negative for any acute infiltrate. Bone marrow biopsy was subsequently done. Hematology oncology consultation was obtained and patient was seen in consultation by Dr. Alvy Bimler. Preliminary bone marrow biopsy did show 50-75% blasts. Treatment options were discussed with patient by oncology and patient declined any treatment options. Patient wanted palliative care and hospice. Patient does have a poor prognosis and likely needs a hospice facility/[facility]. Palliative care ff.  #2 symptomatic anemia Unknown etiology.FOBT is negative. Concern for myelodysplastic syndrome/probable leukemia. Patient is status post 2 units packed red blood cells with clinical improvement. Hemoglobin at 8.5(03/16/14) from 6.8 on admission. Patient is status post bone marrow biopsy with significant blasts. Hematology/Oncology ff.  #3 thrombocytopenia Likely secondary to acute myeloid leukemia. LDH elevated at 637. Patient with no signs of bleeding. Platelet count at 39 on 03/16/14. Hematology/oncology ff.  #4 hypokalemia Repleted.  #5 chronic diastolic heart failure Stable. Compensated. Continue home regimen of atenolol, Norvasc, Lasix.  #6 hypertension Stable. Continued on a home regimen of atenolol, Lasix, Norvasc.  #7 Hypothyroidism TSH  = 81.510. Free T4 and T3 within normal limits. Continue current dose of Synthroid.   #8 history of small bowel obstruction status post colostomy 2014 Stool in colostomy bag. Continue ostomy care.  #8 paroxysmal atrial fibrillation Rate controlled on atenolol. Follow.  #9 prophylaxis SCDs for DVT prophylaxis.  #10 prognosis Patient diagnosed during this hospitalization with acute myeloid leukemia per preliminary bone marrow biopsy which showed 50-75% blasts per oncology. Treatment options were discussed by oncology with patient and family and patient declined treatment at this time. Patient wanted palliative care and hospice. Patient did not want transfusion support. Patient is currently DO NOT RESUSCITATE. Patient with a poor prognosis, with life-threatening thrombocytopenia and leukocytosis. Patient to hospice home tomorrow. Palliative care consultation is currently pending.   Code Status: full Family Communication: updated patient, no family at bedside. Disposition Plan: [facility].   Consultants:  Interventional radiology: Dr. Pascal Lux  Hematology/oncology Dr. Alvy Bimler 03/15/2014  Palliative care : Dr Hilma Favors 03/17/14  Procedures:  CT-guided bone marrow biopsy 03/15/2014 per Dr. Pascal Lux interventional radiology  Chest x-ray 03/13/2014  Abdominal x-ray 03/13/2014  2 units packed red blood cells 03/13/2014,03/14/2014  Antibiotics:  none  HPI/Subjective: Patient alert. Patient states slept well last night. Patient denies any bleeding.  Objective: Filed Vitals:   03/18/14 0451  BP: 148/61  Pulse: 60  Temp: 98.2 F (36.8 C)  Resp: 18    Intake/Output Summary (Last 24 hours) at 03/18/14 0948 Last data filed at 03/18/14 0457  Gross per 24 hour  Intake    240 ml  Output    400 ml  Net   -160 ml   Filed Weights   03/15/14 0388 03/17/14 0440 03/18/14 0451  Weight: 67.042 kg (147 lb 12.8 oz) 66.452 kg (146 lb 8 oz) 65.6 kg (144 lb 10 oz)     Exam:  General:  NAD  Cardiovascular: RRR  Respiratory: clear to auscultation bilaterally.  Abdomen: soft, nontender, nondistended, positive bowel sounds.Colostomy bag intact with stool.  Musculoskeletal: no clubbing cyanosis or edema.  Data Reviewed: Basic Metabolic Panel:  Recent Labs Lab 03/13/14 2020 03/14/14 1000 03/15/14 0610 03/16/14 0433  NA 138 136* 139 136*  K 3.4* 3.4* 3.6* 3.9  CL 99 100 104 104  CO2 23 20 21 21   GLUCOSE 125* 174* 106* 110*  BUN 26* 18 15 15   CREATININE 1.12* 0.92 0.92 0.86  CALCIUM 9.1 8.2* 8.3* 8.2*   Liver Function Tests:  Recent Labs Lab 03/13/14 2020 03/14/14 1000  AST 31 27  ALT 20 17  ALKPHOS 67 57  BILITOT 0.5 0.6  PROT 8.0 6.9  ALBUMIN 3.7 3.2*   No results for input(s): LIPASE, AMYLASE in the last 168 hours. No results for input(s): AMMONIA in the last 168 hours. CBC:  Recent Labs Lab 03/13/14 2020 03/14/14 1000 03/15/14 0610 03/16/14 0433  WBC 39.1* 21.6* 29.2* 26.4*  NEUTROABS 29.7*  --   --   --   HGB 6.8* 8.7* 8.7* 8.5*  HCT 20.6* 26.3* 26.5* 26.2*  MCV 102.5* 94.3 95.7 96.7  PLT 50* 39* 45* 39*   Cardiac Enzymes:  Recent Labs Lab 03/13/14 2020  TROPONINI <0.30   BNP (last 3 results)  Recent Labs  04/03/13 0949 04/05/13 0500 03/13/14 2020  PROBNP 13219.0* 3467.0* 1898.0*   CBG:  Recent Labs Lab 03/14/14 0718 03/15/14 0740 03/16/14 0739 03/17/14 0736  GLUCAP 108* 102* 96 104*    Recent Results (from the past 240 hour(s))  MRSA PCR Screening     Status: None   Collection Time: 03/13/14 11:29 PM  Result Value Ref Range Status   MRSA by PCR NEGATIVE NEGATIVE Final    Comment:        The GeneXpert MRSA Assay (FDA approved for NASAL specimens only), is one component of a comprehensive MRSA colonization surveillance program. It is not intended to diagnose MRSA infection nor to guide or monitor treatment for MRSA infections.   Clostridium Difficile by PCR     Status: None    Collection Time: 03/14/14  1:48 PM  Result Value Ref Range Status   C difficile by pcr NEGATIVE NEGATIVE Final    Comment: Performed at [facility]     Studies: No results found.  Scheduled Meds: . acetaminophen  650 mg Oral TID  . amLODipine  5 mg Oral Daily  . atenolol  25 mg Oral QHS  . fluticasone  2 spray Each Nare Daily  . furosemide  20 mg Oral Daily  . levothyroxine  12.5 mcg Oral QAC breakfast  . loratadine  10 mg Oral Daily  . LORazepam  1 mg Oral QHS  . pseudoephedrine  120 mg Oral BID  . sodium chloride  3 mL Intravenous Q12H   Continuous Infusions:   Principal Problem:   Acute myeloid leukemia Active Problems:   Leukocytosis   HTN (hypertension)   CHF (congestive heart failure)   Chronic kidney disease (CKD), stage III (moderate)   Symptomatic anemia   Anemia   Weakness   Thrombocytopenia   Hypothyroidism    Time spent: [place] [facility] Pager [phone]. If 7PM-7AM, please contact night-coverage at [email or web address], password [facility] 03/18/2014, 9:48 AM  LOS: 5 days

## 2014-03-18 NOTE — Progress Notes (Signed)
Utilization Review completed.  

## 2014-03-19 MED ORDER — BISACODYL 10 MG RE SUPP: 10.0000 mg | Freq: Every day | RECTAL | Status: AC | PRN

## 2014-03-19 MED ORDER — ZOLPIDEM TARTRATE 5 MG PO TABS: 5.0000 mg | ORAL_TABLET | Freq: Every evening | ORAL | Status: AC | PRN

## 2014-03-19 MED ORDER — MORPHINE SULFATE 2 MG/ML IJ SOLN: 1.0000 mg | INTRAMUSCULAR | Status: AC | PRN

## 2014-03-19 MED ORDER — CALCIUM CARBONATE ANTACID 500 MG PO CHEW: 400.0000 mg | CHEWABLE_TABLET | Freq: Three times a day (TID) | ORAL | Status: AC | PRN

## 2014-03-19 MED ORDER — LORAZEPAM 2 MG/ML PO CONC: 1.0000 mg | Freq: Every day | ORAL | Status: AC

## 2014-03-19 MED ORDER — FLUTICASONE PROPIONATE 50 MCG/ACT NA SUSP: 2.0000 | Freq: Every day | NASAL | Status: AC

## 2014-03-19 MED ORDER — LEVOTHYROXINE SODIUM 25 MCG PO TABS: 12.5000 ug | ORAL_TABLET | Freq: Every day | ORAL | Status: AC

## 2014-03-19 MED ORDER — LORAZEPAM 0.5 MG PO TABS: 0.5000 mg | ORAL_TABLET | ORAL | Status: AC | PRN

## 2014-03-19 NOTE — Progress Notes (Signed)
Patient discharged to [facility] via personal care discharge packet prepared by CSW and  given to family for the facility. Report given S. Tucker-RN at the facility. Patient denies any distress. Transported to the car by staff via wheelchair. No skin issue.

## 2014-03-19 NOTE — Progress Notes (Signed)
UR completed 

## 2014-03-19 NOTE — Discharge Summary (Signed)
Physician Discharge Summary  [name]:[id number] DOB: [birth year] DOA: 03/13/2014  PCP: Donnie Coffin, MD  Admit date: 03/13/2014 Discharge date: 03/19/2014  Time spent: 60 minutes  Recommendations for Outpatient Follow-up:  1. Patient is to be discharged to [facility]. Follow up with MD at [facility]  Discharge Diagnoses:  Principal Problem:   Acute myeloid leukemia Active Problems:   Leukocytosis   HTN (hypertension)   CHF (congestive heart failure)   Chronic kidney disease (CKD), stage III (moderate)   Symptomatic anemia   Anemia   Weakness   Thrombocytopenia   Hypothyroidism   Discharge Condition: stable  Diet recommendation: Regular  Filed Weights   03/17/14 0440 03/18/14 0451 03/19/14 0523  Weight: 66.452 kg (146 lb 8 oz) 65.6 kg (144 lb 10 oz) 65.5 kg (144 lb 6.4 oz)    History of present illness:  [name] is a 80 y.o. female with prior anemia presents with symptomatic anemia. Patient has a history of iron deficiency anemia on chronic iron therapy presents with excessive fatigue. She stated that she had absolutely no energy. She did not complain of dizziness or passing out.She stated that she had no real SOB. She stated that she did note she had some pounding in her chest at night. She stated she was told to take her beta blockers at night for this. The patient finally decided to go to her PCP had lab work done and this showed a hemoglobin of 6.8 gram. In addition to this she has a history of SBO and had colostomy about a year ago. Patient states that she has does not like the ostomy but has no complaints of any blood in the bag. Her stool has been dark and brown because of the iron. She had no hematemesis and she has no upper abdominal pain. She also did have a history of MRSA  Hospital Course:  #1 Acute myeloid leukemia Patient had presented with a significant leukocytosis and symptomatic anemia. Peripheral smear showed atypical mononuclear  cells worrisome for some for lymphoproliferative process versus a leukemic process. Urinalysis which was done was negative. Patient remained afebrile. Chest x-ray which was done was negative for any acute infiltrate. Bone marrow biopsy was subsequently done. Hematology oncology consultation was obtained and patient was seen in consultation by Dr. Alvy Bimler. Preliminary bone marrow biopsy did show 50-75% blasts. Bone marrow flow cytometry they show significant blastic population. Treatment options were discussed with patient by oncology and patient declined any treatment options and chose a comfort approach. Patient wanted palliative care and hospice. Patient does have a poor prognosis and likely needs a hospice facility/[facility]. Patient was seen in consultation by palliative care and patient was plated DO NOT RESUSCITATE. It was felt patient was at risk for volume overload with transfusion requiring IV Lasix for diastolic heart failure. Patient also noted to have life-threatening thrombocytopenia and leukocytosis that could have led to immediate instability. Patient was noted to have a poor prognosis with acute myeloid leukemia and patient chose a purely comfort care pathway to allow for natural death to occur. Patient's symptoms were managed and patient will be discharged to a hospice home.  #2 symptomatic anemia Patient was admitted with fatigue and generalized weakness felt to be secondary to a symptomatic anemia. FOBT was negative. Concern for myelodysplastic syndrome/probable leukemia. Patient was transfused 2 units packed red blood cells with clinical improvement. Hemoglobin at 8.5(03/16/14) from 6.8 on admission. Patient is status post bone marrow biopsy with significant blasts consistent with  acute myeloid leukemia. Patient was seen in consultation by hematology oncology followed during the hospitalization. Patient was also seen by the palliative care team. Patient decided on comfort approach and  patient be discharged to a hospice home.  #3 thrombocytopenia Likely secondary to acute myeloid leukemia. LDH elevated at 637. Patient with no signs of bleeding throughout the hospitalization. Platelet count had decreased to 39 on 03/16/14. Patient was seen in consultation and followed by hematology/oncology during the hospitalization. Patient at risk for life-threatening bleeding and has been seen by palliative care and chose a comfort approach. Patient will be discharged to a hospice home.  #4 hypokalemia Repleted.  #5 chronic diastolic heart failure Stable. Compensated. Continue home regimen of atenolol, Norvasc, Lasix.  #6 hypertension Stable. Continued on a home regimen of atenolol, Lasix, Norvasc.  #7 Hypothyroidism TSH = 81.510. Free T4 and T3 within normal limits. Patient was started on Synthroid.   #8 history of small bowel obstruction status post colostomy 2014 Stool in colostomy bag. Continue ostomy care.  #8 paroxysmal atrial fibrillation Rate controlled on atenolol, throughout the hospitalization.   Procedures:  CT-guided bone marrow biopsy 03/15/2014 per Dr. Pascal Lux interventional radiology  Chest x-ray 03/13/2014  Abdominal x-ray 03/13/2014  2 units packed red blood cells 03/13/2014,03/14/2014    Consultations:  Interventional radiology: Dr. Pascal Lux  Hematology/oncology Dr. Alvy Bimler 03/15/2014  Palliative care : Dr Hilma Favors 03/17/14    Discharge Exam: Filed Vitals:   03/19/14 0523  BP: 130/57  Pulse: 64  Temp: 98.3 F (36.8 C)  Resp: 18    General: NAD Cardiovascular: RRR Respiratory: CTAB  Discharge Instructions You were cared for by a hospitalist during your hospital stay. If you have any questions about your discharge medications or the care you received while you were in the hospital after you are discharged, you can call the unit and asked to speak with the hospitalist on call if the hospitalist that took care of you is not available. Once  you are discharged, your primary care physician will handle any further medical issues. Please note that NO REFILLS for any discharge medications will be authorized once you are discharged, as it is imperative that you return to your primary care physician (or establish a relationship with a primary care physician if you do not have one) for your aftercare needs so that they can reassess your need for medications and monitor your lab values.  Discharge Instructions    Diet general    Complete by:  As directed      Discharge instructions    Complete by:  As directed   Follow up with MD at [facility]     Increase activity slowly    Complete by:  As directed           Current Discharge Medication List    START taking these medications   Details  bisacodyl (DULCOLAX) 10 MG suppository Place 1 suppository (10 mg total) rectally daily as needed for moderate constipation. Qty: 12 suppository, Refills: 0    calcium carbonate (TUMS - DOSED IN MG ELEMENTAL CALCIUM) 500 MG chewable tablet Chew 2 tablets (400 mg of elemental calcium total) by mouth 3 (three) times daily as needed for indigestion or heartburn.    fluticasone (FLONASE) 50 MCG/ACT nasal spray Place 2 sprays into both nostrils daily. Refills: 2    levothyroxine (SYNTHROID, LEVOTHROID) 25 MCG tablet Take 0.5 tablets (12.5 mcg total) by mouth daily before breakfast.    LORazepam (ATIVAN) 0.5 MG tablet Take 1  tablet (0.5 mg total) by mouth every 4 (four) hours as needed for anxiety. Qty: 30 tablet, Refills: 0    LORazepam (ATIVAN) 2 MG/ML concentrated solution Take 0.5 mLs (1 mg total) by mouth at bedtime. Qty: 30 mL, Refills: 0    morphine 2 MG/ML injection Inject 0.5 mLs (1 mg total) into the skin every hour as needed (distress). Qty: 30 mL, Refills: 0    zolpidem (AMBIEN) 5 MG tablet Take 1 tablet (5 mg total) by mouth at bedtime as needed for sleep. Qty: 30 tablet, Refills: 0      CONTINUE these medications which have NOT  CHANGED   Details  acetaminophen (TYLENOL) 500 MG tablet Take 500 mg by mouth every 6 (six) hours as needed for moderate pain (pain).     amLODipine (NORVASC) 5 MG tablet Take 1 tablet (5 mg total) by mouth daily. Qty: 30 tablet, Refills: 1    atenolol (TENORMIN) 25 MG tablet Take 1 tablet (25 mg total) by mouth daily after breakfast. Qty: 30 tablet, Refills: 1    Carboxymethylcellul-Glycerin (OPTIVE) 0.5-0.9 % SOLN Apply 1 drop to eye daily as needed (for dry eyes).    cetirizine (ZYRTEC) 10 MG tablet Take 10 mg by mouth at bedtime.     furosemide (LASIX) 20 MG tablet Take 1 tablet (20 mg total) by mouth daily. Qty: 30 tablet, Refills: 1    potassium gluconate 595 MG TABS Take 595 mg by mouth daily.      STOP taking these medications     ALPRAZolam (XANAX) 0.25 MG tablet      Ferrous Sulfate (IRON) 325 (65 FE) MG TABS      folic acid (FOLVITE) 1 MG tablet      amoxicillin-clavulanate (AUGMENTIN) 500-125 MG per tablet        Allergies  Allergen Reactions  . Neosporin [Neomycin-Bacitracin Zn-Polymyx]   . Other     Depression medicines: nightmares, rash, vertigo,  Unsure of rx names, has tried 2.  . Celexa [Citalopram Hydrobromide] Rash  . Sulfa Antibiotics Rash   Follow-up Information    Please follow up.   Why:  f/u with MD at [facility]       The results of significant diagnostics from this hospitalization (including imaging, microbiology, ancillary and laboratory) are listed below for reference.    Significant Diagnostic Studies: Dg Chest 2 View  03/13/2014   CLINICAL DATA:  Weakness and tired for 2 weeks, history hypertension, atrial fibrillation  EXAM: CHEST  2 VIEW  COMPARISON:  04/04/2013  FINDINGS: Enlargement of cardiac silhouette.  Atherosclerotic calcification aorta.  Slight pulmonary vascular congestion.  Lungs grossly clear.  No infiltrate, pleural effusion or pneumothorax.  Pulmonary infiltrate/edema seen on previous exam not identified on current  study.  Thoracolumbar scoliosis.  IMPRESSION: Enlargement of cardiac silhouette with pulmonary vascular congestion.  No definite acute abnormalities.   Electronically Signed   By: Lavonia Dana M.D.   On: 03/13/2014 20:54   Ct Biopsy  03/15/2014   INDICATION: Unspecified anemia, now with leukocytosis. Please perform CT guided bone marrow biopsy for tissue diagnostic purposes.  EXAM: CT GUIDED BONE MARROW BIOPSY AND ASPIRATION.  MEDICATIONS: Fentanyl 75 mcg IV; Versed 1.5 mg IV  ANESTHESIA/SEDATION: Sedation Time  8 minutes  CONTRAST:  None  COMPLICATIONS: None immediate.  PROCEDURE: Informed consent was obtained from the patient following an explanation of the procedure, risks, benefits and alternatives. The patient understands, agrees and consents for the procedure. All questions were addressed. A time  out was performed prior to the initiation of the procedure. The patient was positioned prone and non-contrast localization CT was performed of the pelvis to demonstrate the iliac marrow spaces. The operative site was prepped and draped in the usual sterile fashion.  Under sterile conditions and local anesthesia, a 22 gauge spinal needle was utilized for procedural planning. Next, an 11 gauge coaxial bone biopsy needle was advanced into the left iliac marrow space. Needle position was confirmed with CT imaging. Initially, bone marrow aspiration was performed. Next, a bone marrow biopsy was obtained with the 11 gauge outer bone marrow device. The 11 gauge coaxial bone biopsy needle was re-advanced into a slightly different location within the left iliac marrow space, positioning was confirmed and an additional bone marrow biopsy was obtained. Samples were prepared with the cytotechnologist and deemed adequate. The needle was removed intact. Hemostasis was obtained with compression and a dressing was placed. The patient tolerated the procedure well without immediate post procedural complication.  IMPRESSION:  Successful CT guided left iliac bone marrow aspiration and core biopsies.   Electronically Signed   By: Sandi Mariscal M.D.   On: 03/15/2014 11:52   Dg Abd 2 Views  03/13/2014   CLINICAL DATA:  Abdominal distention with swelling around the colostomy site with abdominal pain for 3-4 weeks  EXAM: ABDOMEN - 2 VIEW  COMPARISON:  None.  FINDINGS: There is increased stool burden throughout the colon. There is no large or small bowel obstructive pattern. No free extraluminal gas collections are demonstrated  There is moderate levo curvature of the mid lumbar spine.  IMPRESSION: Increased stool burden throughout the colon is consistent with clinical constipation.   Electronically Signed   By: David  Martinique   On: 03/13/2014 16:53    Microbiology: Recent Results (from the past 240 hour(s))  MRSA PCR Screening     Status: None   Collection Time: 03/13/14 11:29 PM  Result Value Ref Range Status   MRSA by PCR NEGATIVE NEGATIVE Final    Comment:        The GeneXpert MRSA Assay (FDA approved for NASAL specimens only), is one component of a comprehensive MRSA colonization surveillance program. It is not intended to diagnose MRSA infection nor to guide or monitor treatment for MRSA infections.   Clostridium Difficile by PCR     Status: None   Collection Time: 03/14/14  1:48 PM  Result Value Ref Range Status   C difficile by pcr NEGATIVE NEGATIVE Final    Comment: Performed at [place]: Basic Metabolic Panel:  Recent Labs Lab 03/13/14 2020 03/14/14 1000 03/15/14 0610 03/16/14 0433  NA 138 136* 139 136*  K 3.4* 3.4* 3.6* 3.9  CL 99 100 104 104  CO2 _0 GLUCOSE 125* 174* 106* 110*  BUN 26* _1 CREATININE 1.12* 0.92 0.92 0.86  CALCIUM 9.1 8.2* 8.3* 8.2*   Liver Function Tests:  Recent Labs Lab 03/13/14 2020 03/14/14 1000  AST 31 27  ALT 20 17  ALKPHOS 67 57  BILITOT 0.5 0.6  PROT 8.0 6.9  ALBUMIN 3.7 3.2*   No results for input(s): LIPASE,  AMYLASE in the last 168 hours. No results for input(s): AMMONIA in the last 168 hours. CBC:  Recent Labs Lab 03/13/14 2020 03/14/14 1000 03/15/14 0610 03/16/14 0433  WBC 39.1* 21.6* 29.2* 26.4*  NEUTROABS 29.7*  --   --   --   HGB 6.8* 8.7* 8.7*  8.5*  HCT 20.6* 26.3* 26.5* 26.2*  MCV 102.5* 94.3 95.7 96.7  PLT 50* 39* 45* 39*   Cardiac Enzymes:  Recent Labs Lab 03/13/14 2020  TROPONINI <0.30   BNP: BNP (last 3 results)  Recent Labs  04/03/13 0949 04/05/13 0500 03/13/14 2020  PROBNP 13219.0* 3467.0* 1898.0*   CBG:  Recent Labs Lab 03/14/14 0718 03/15/14 0740 03/16/14 0739 03/17/14 0736  GLUCAP 108* 102* 96 104*       Signed:  THOMPSON,DANIEL MD [facility] 03/19/2014, 8:38 AM

## 2014-03-19 NOTE — Progress Notes (Signed)
CARE MANAGEMENT NOTE 03/19/2014  Patient:  [name], [name]   Account Number:  [id number]  Date Initiated:  03/14/2014  Documentation initiated by:  [facility]  Subjective/Objective Assessment:   80 Y/O F ADMITTED W/ANEMIA.EX:BMWU DEFICIENCY,CHF.     Action/Plan:   FROM HOME W/FAMILY(GOOD SUPPORT).HAS PCP,PHARMACY,CANE,RW,3N1.   Anticipated DC Date:  03/19/2014   Anticipated DC Plan:  [place]  In-house referral  Clinical Social Worker      DC Planning Services  CM consult      Choice offered to / List presented to:  C-3 Spouse           Status of service:  Completed, signed off Medicare Important Message given?  YES (If response is "NO", the following Medicare IM given date fields will be blank) Date Medicare IM given:  03/19/2014 Medicare IM given by:  [facility] Date Additional Medicare IM given:   Additional Medicare IM given by:    Discharge Disposition:  [place]  Per UR Regulation:  Reviewed for med. necessity/level of care/duration of stay  If discussed at [place] of Stay Meetings, dates discussed:    Comments:  03/19/2014 1000 NCM spoke to pt and no NCM needs identified. Scheduled dc to [facility] today. Jonnie Finner RN CCM Case Mgmt phone [phone]  03/17/14 6P-KATHY MAHABIR RN,BSN NCM [phone] REQUESTED TO TALK TO PATIENT ABOUT D/C PLANS, BUT WAS REDIRECTED SINCE PALLIATIVE TEAM WAS CONSULTING PATIENT/FAMILY. DIDN'T MEET INPATIENT CRITERIA FROM ADMISSION THROUGH 03/17/14 @ 12:30P,MD NOTIFIED, RECOMMENDED TO D/C TO [facility] THERE WERE NO BEDS @ [facility].ALTERANATIVE FOR D/C PLANS NOT ALLOWED FOR CM/SW: HOME W/HOSPICE SERVICES OR SNF W/PALLIATIVE SERVICES.PER [facility]-THEY WILL HAVE A BED ON SUNDAY.  11.20.15 [place] RNBCN ACM [phone] CC44 docs in shadow chart to be scanned.  03/17/14 KATHY MAHABIR RN,BSN NCM [place] NOTIFIED OF PROCESS.PATIENT SEEN BY PALLIATIVE TEAM.CM WILL PROVIDE HOME HOSPICE PROVIDER LIST.SW WILL OFFER FACILITY OPTIONS.  03/15/14 KATHY MAHABIR RN,BSN NCM [place].TC [facility] REP AWARE OF REFERRAL.AWAIT FINAL  HHC ORDERS. CC 44 GIVEN TO SPOUSE.VOICED UNDERSTANDING.PT-HH.PROVIDED SON [place] AGENCY LIST,HE WILL GIVE IT TO SPOUSE TO CHOOSE.AWAIT CHOICE.AWAIT FINAL HHPT ORDER.  03/14/14 KATHY MAHABIR RN,BSN NCM [phone] NO ANTICIPATED D/C NEEDS.

## 2014-03-19 NOTE — Progress Notes (Signed)
CSW prepared discharge packet for [facility] which include Discharge summary, prescriptions, and signed DNR  CSW faxed discharge summary to [facility] and let them know pt was ready for discharge.  CSW met with pt and son at bedside to present packet as family wants to transport pt to [facility] themselves  No more social work needs  CSW signing off.  Dede Query, LCSW [place] Worker - Weekend Coverage cell #: [phone]

## 2014-03-19 NOTE — Plan of Care (Signed)
Problem: Discharge Progression Outcomes Goal: Discharge plan in place and appropriate Outcome: Completed/Met Date Met:  03/19/14 Goal: Pain controlled with appropriate interventions Outcome: Completed/Met Date Met:  03/19/14

## 2014-03-27 LAB — CHROMOSOME ANALYSIS, BONE MARROW

## 2014-04-06 ENCOUNTER — Telehealth: Payer: Self-pay | Admitting: *Deleted

## 2014-04-06 NOTE — Telephone Encounter (Signed)
Mailed bone marrow biopsy results to pt's son.  He confirmed his address [place], [place], [place], [address].

## 2014-04-11 IMAGING — CT CT ABD-PELV W/ CM
1 of 3 series · 14 of 32 positions shown, 19 images · IV contrast (OMNIPAQUE 300)
Comparison: Abdominal radiographs 05/01/2012

CLINICAL DATA: Nausea, vomiting, question small bowel obstruction,
abnormal radiographs

CT ABDOMEN AND PELVIS WITH CONTRAST
TECHNIQUE: Multidetector CT imaging of the abdomen and pelvis was
performed following the standard protocol during bolus
administration of intravenous contrast. Sagittal and coronal MPR
images reconstructed from axial data set.
Contrast: 100mL OMNIPAQUE IOHEXOL 300 MG/ML  SOLN Dilute oral
contrast.

[Series 2: abd/pel with · axial · 0.70mm/px · z∈[-466,-76]mm · 14 of 88 slices shown, 19 images]
[im 5/88  soft-tissue]
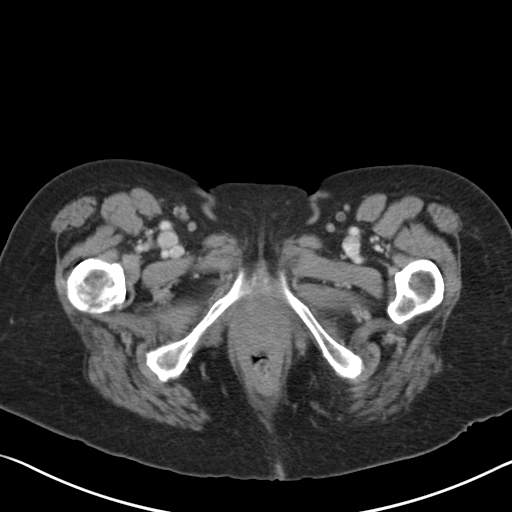
[im 5/88  bone]
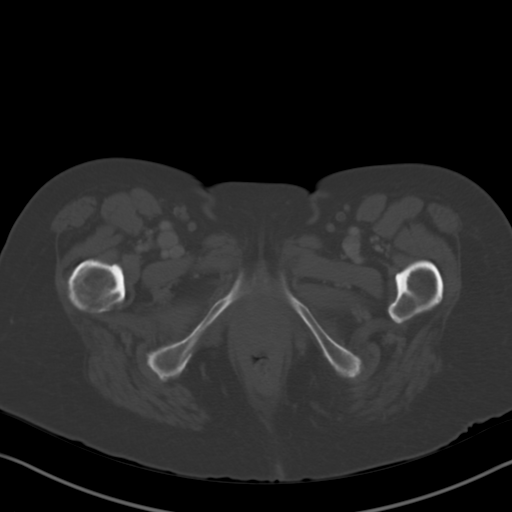
[im 14/88  soft-tissue]
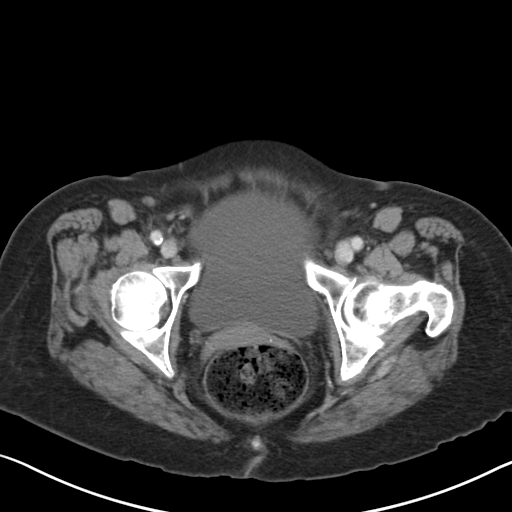
[im 19/88  soft-tissue]
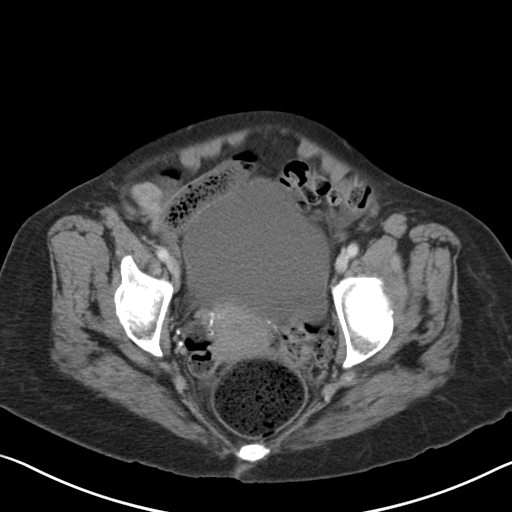
[im 23/88  soft-tissue]
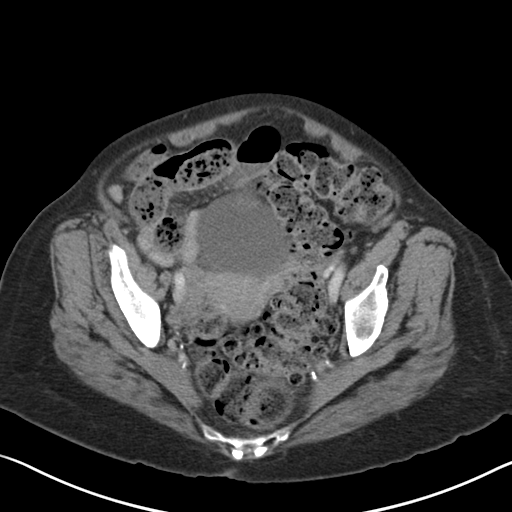
[im 33/88  soft-tissue]
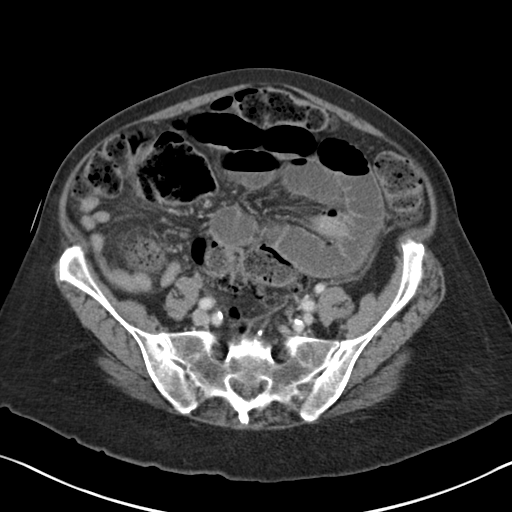
[im 37/88  soft-tissue]
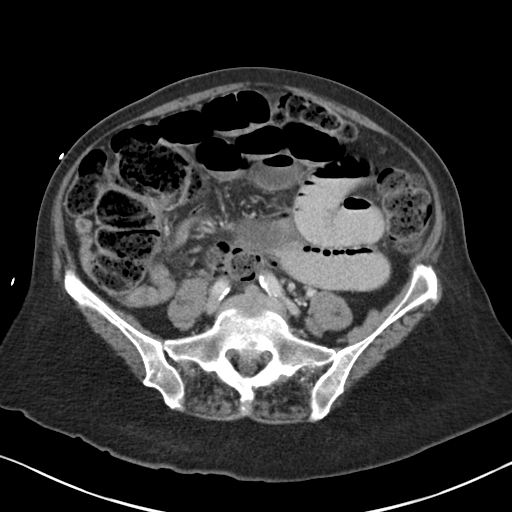
[im 46/88  soft-tissue]
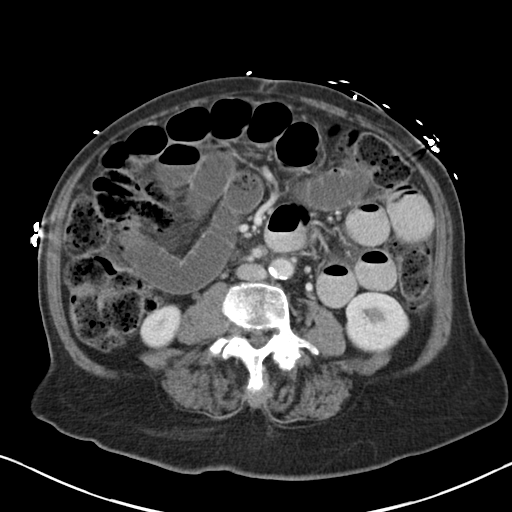
[im 51/88  soft-tissue]
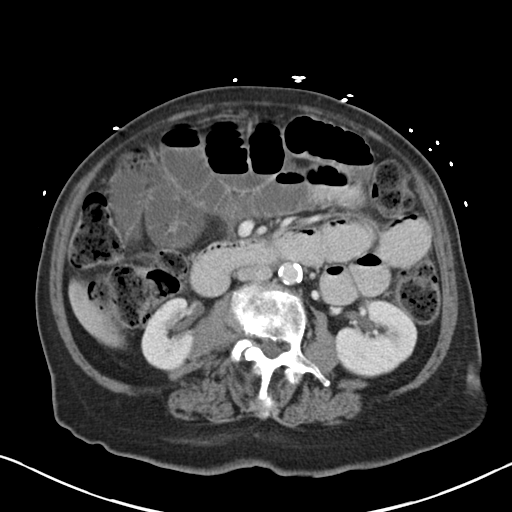
[im 55/88  soft-tissue]
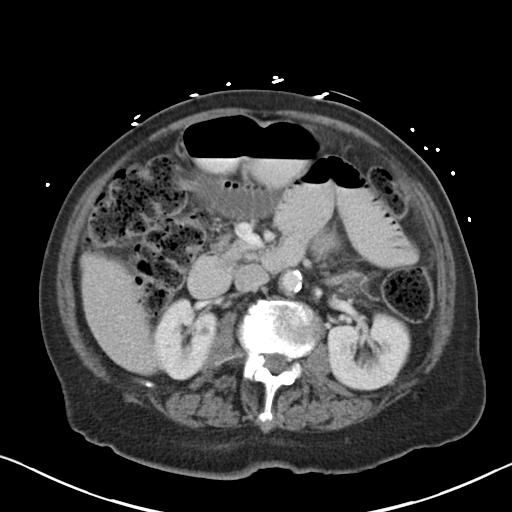
[im 55/88  bone]
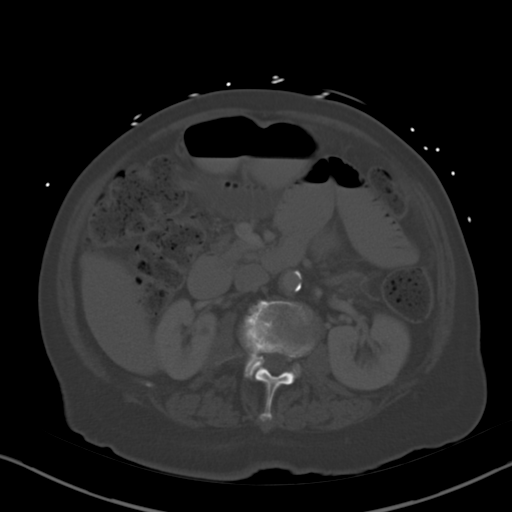
[im 65/88  soft-tissue]
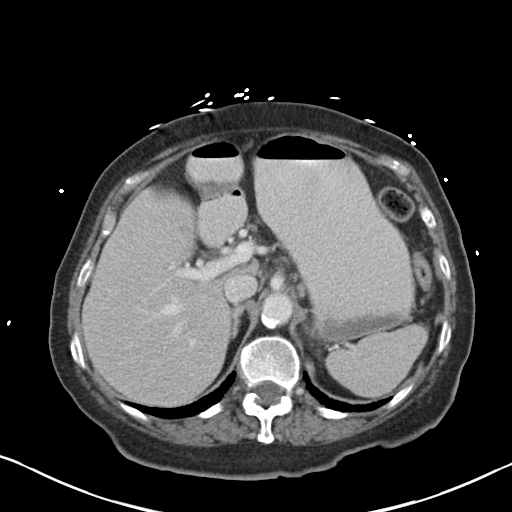
[im 69/88  soft-tissue]
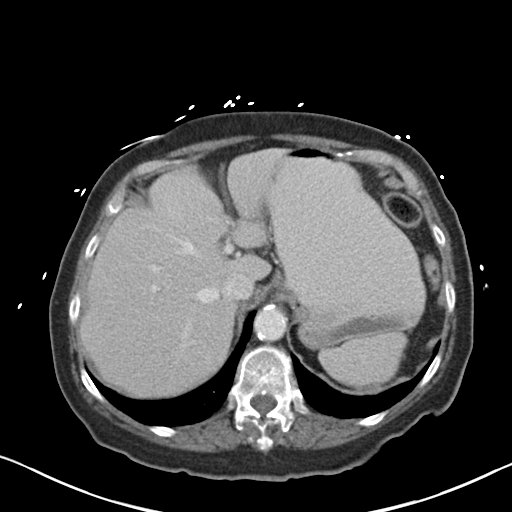
[im 69/88  lung]
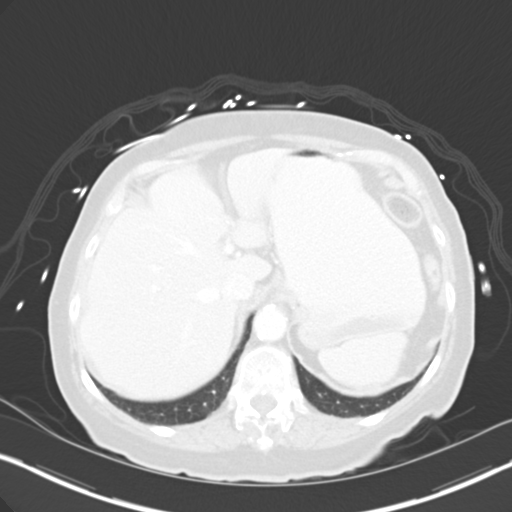
[im 74/88  soft-tissue]
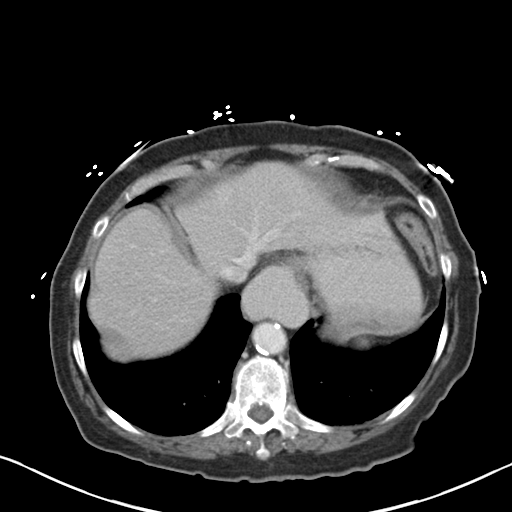
[im 74/88  lung]
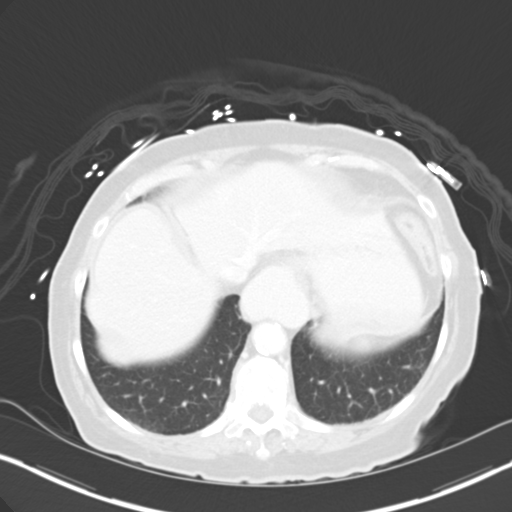
[im 78/88  lung]
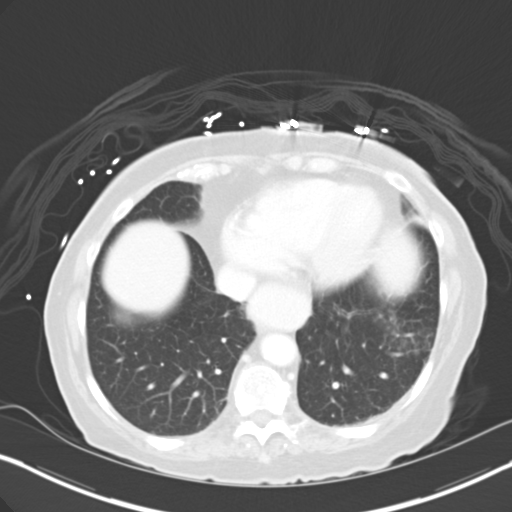
[im 83/88  soft-tissue]
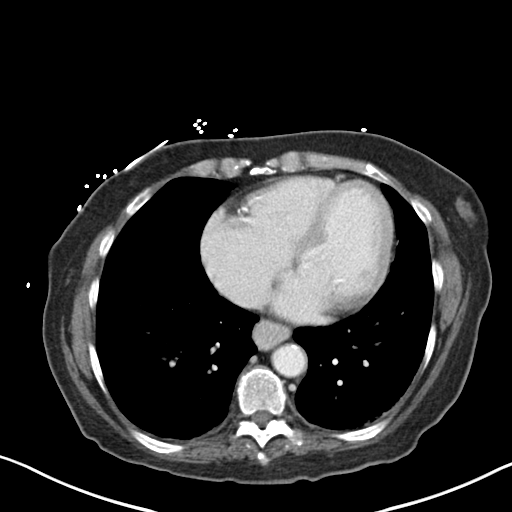
[im 83/88  lung]
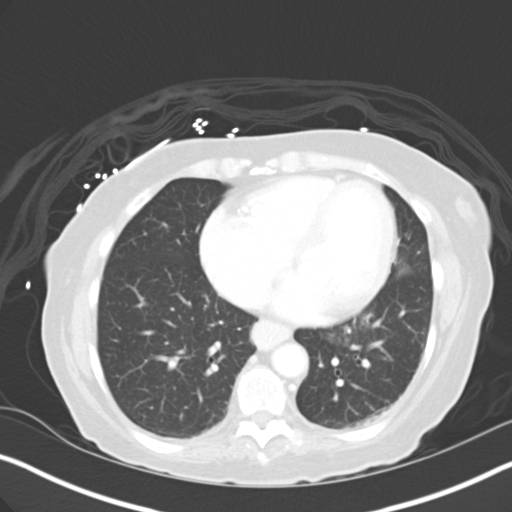

[14 of 32 positions shown; findings below may reference images not displayed]

FINDINGS: Minimal atelectasis right lung base.
Moderate sized hiatal hernia.
Small cyst upper pole right kidney.
Liver, spleen, pancreas, kidneys, and adrenal glands otherwise
unremarkable.
Dilatation of stomach and proximal small bowel loops with
decompressed distal small bowel loops compatible with small bowel
obstruction.
Dilatation of small bowel loops terminates in the upper mid pelvis
anteriorly image 62.
Small bowel stool sign is identified within the dilated distal
small bowel loops. Question minimal bowel wall thickening at the
point of obstruction.
Stool is present throughout the colon and rectum.

No additional bowel wall thickening, free fluid, or free air.
Unremarkable bladder, ureters, and adnexae.
Small fat containing lipoleiomyoma within uterus.
Normal appendix.
No mass, adenopathy or hernia.
Bones appear demineralized.
Scattered atherosclerotic calcifications.
Degenerative disc disease changes lumbar spine with superior
endplate compression fracture of T11, age indeterminate.
IMPRESSION: Small bowel obstruction with transition zone in the anterior
central pelvis, associated with bowel wall thickening at the point
of obstruction.
This could represent an adhesion though subtle mass lesion is not
excluded.
Increased stool throughout colon.
Moderate sized hiatal hernia.

## 2014-04-11 IMAGING — CR DG ABDOMEN ACUTE W/ 1V CHEST
4 series · 4 of 4 positions shown · non-contrast
Comparison: Chest radiograph 05/28/2006
Correlation:  CT chest 11/10/2005

CLINICAL DATA: Nausea, vomiting, abdominal pain

ACUTE ABDOMEN SERIES (ABDOMEN 2 VIEW & CHEST 1 VIEW)

[x chest ap]
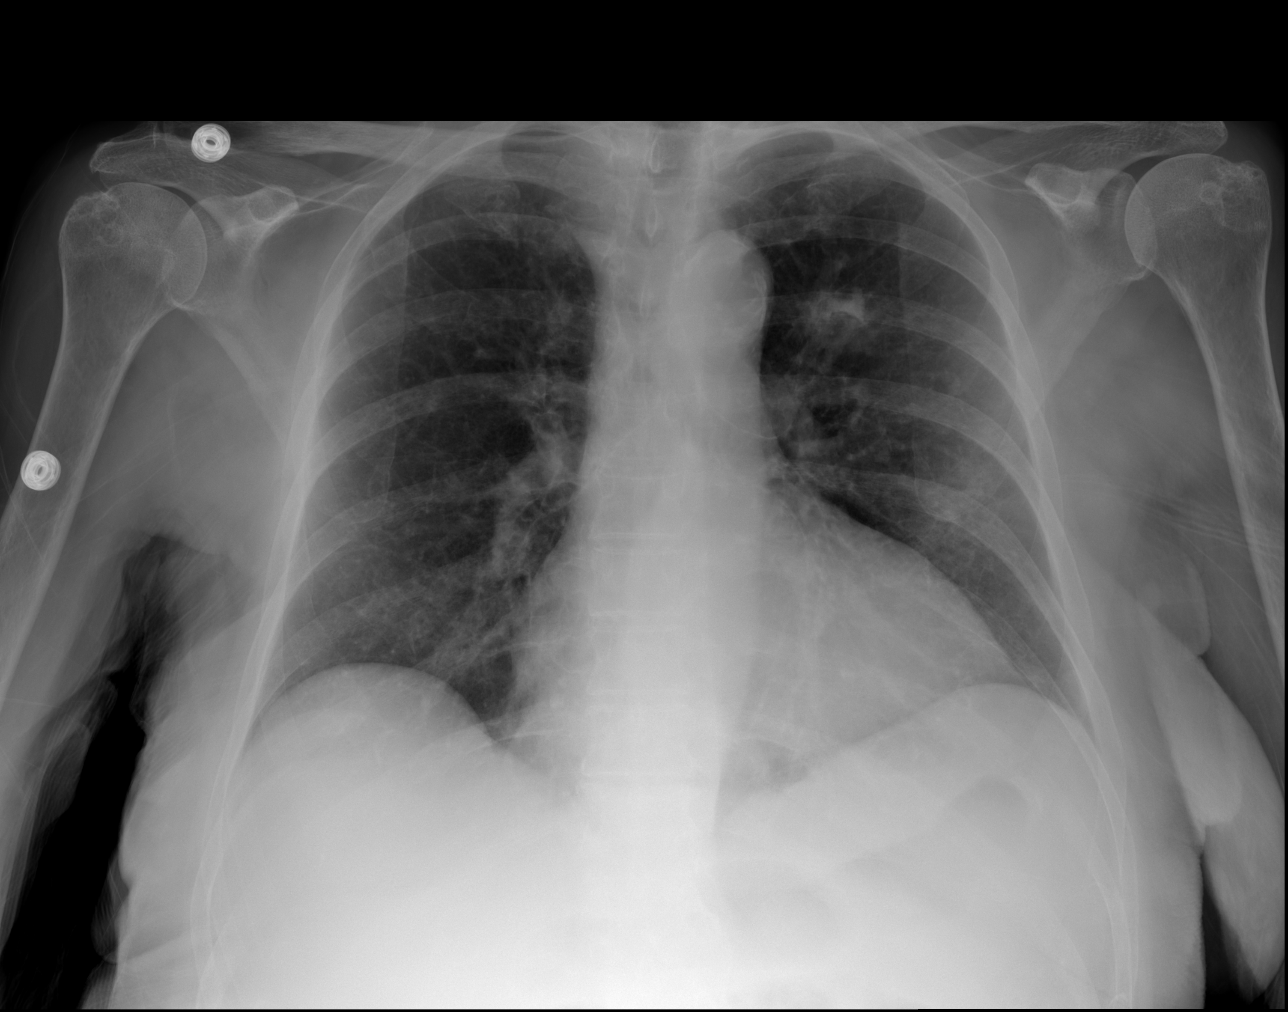

[w abdomen decub (1 of 2)]
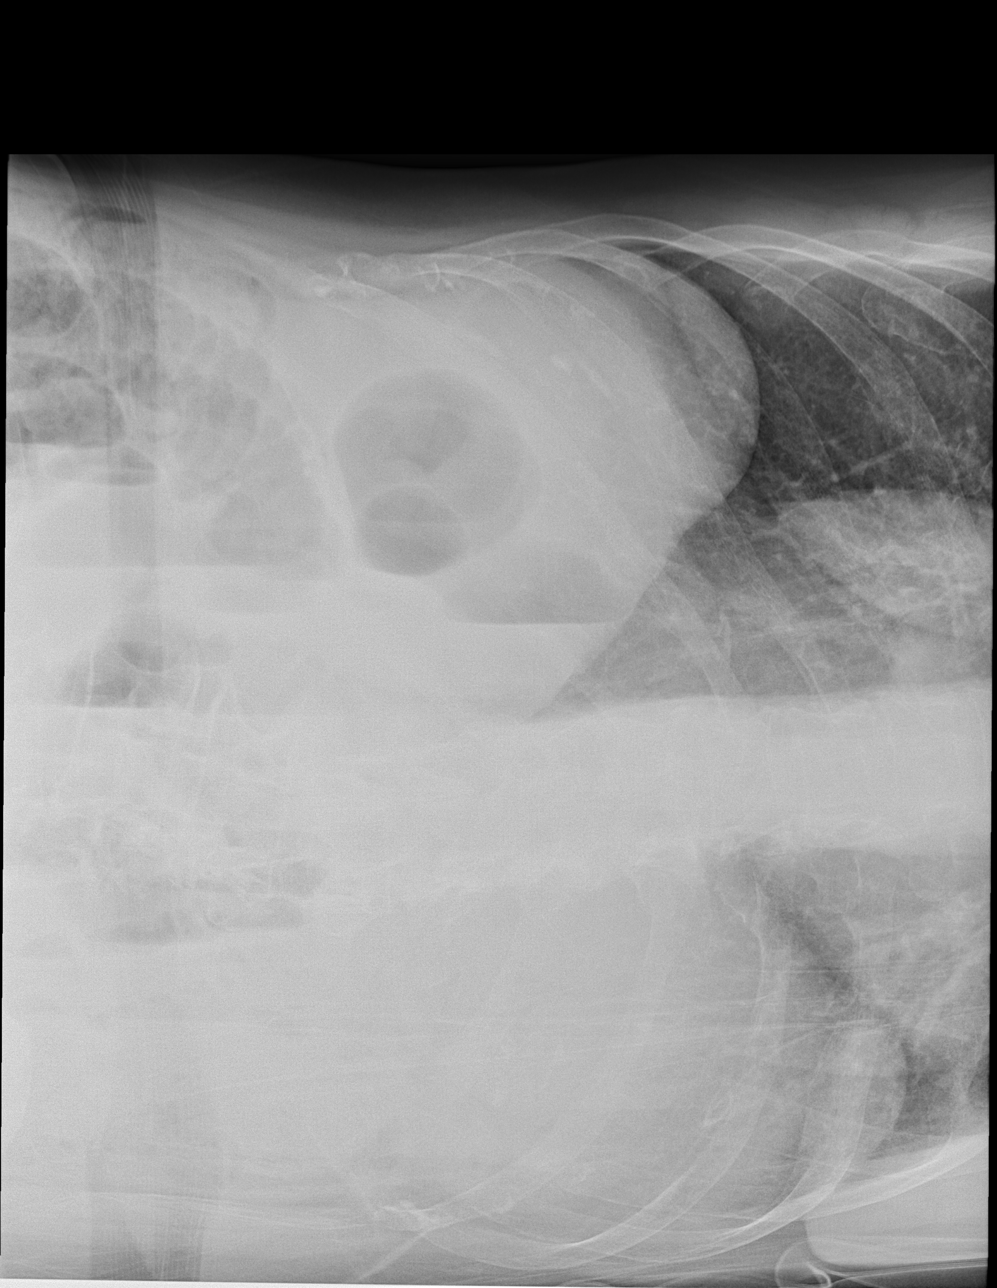

[w abdomen decub (2 of 2)]
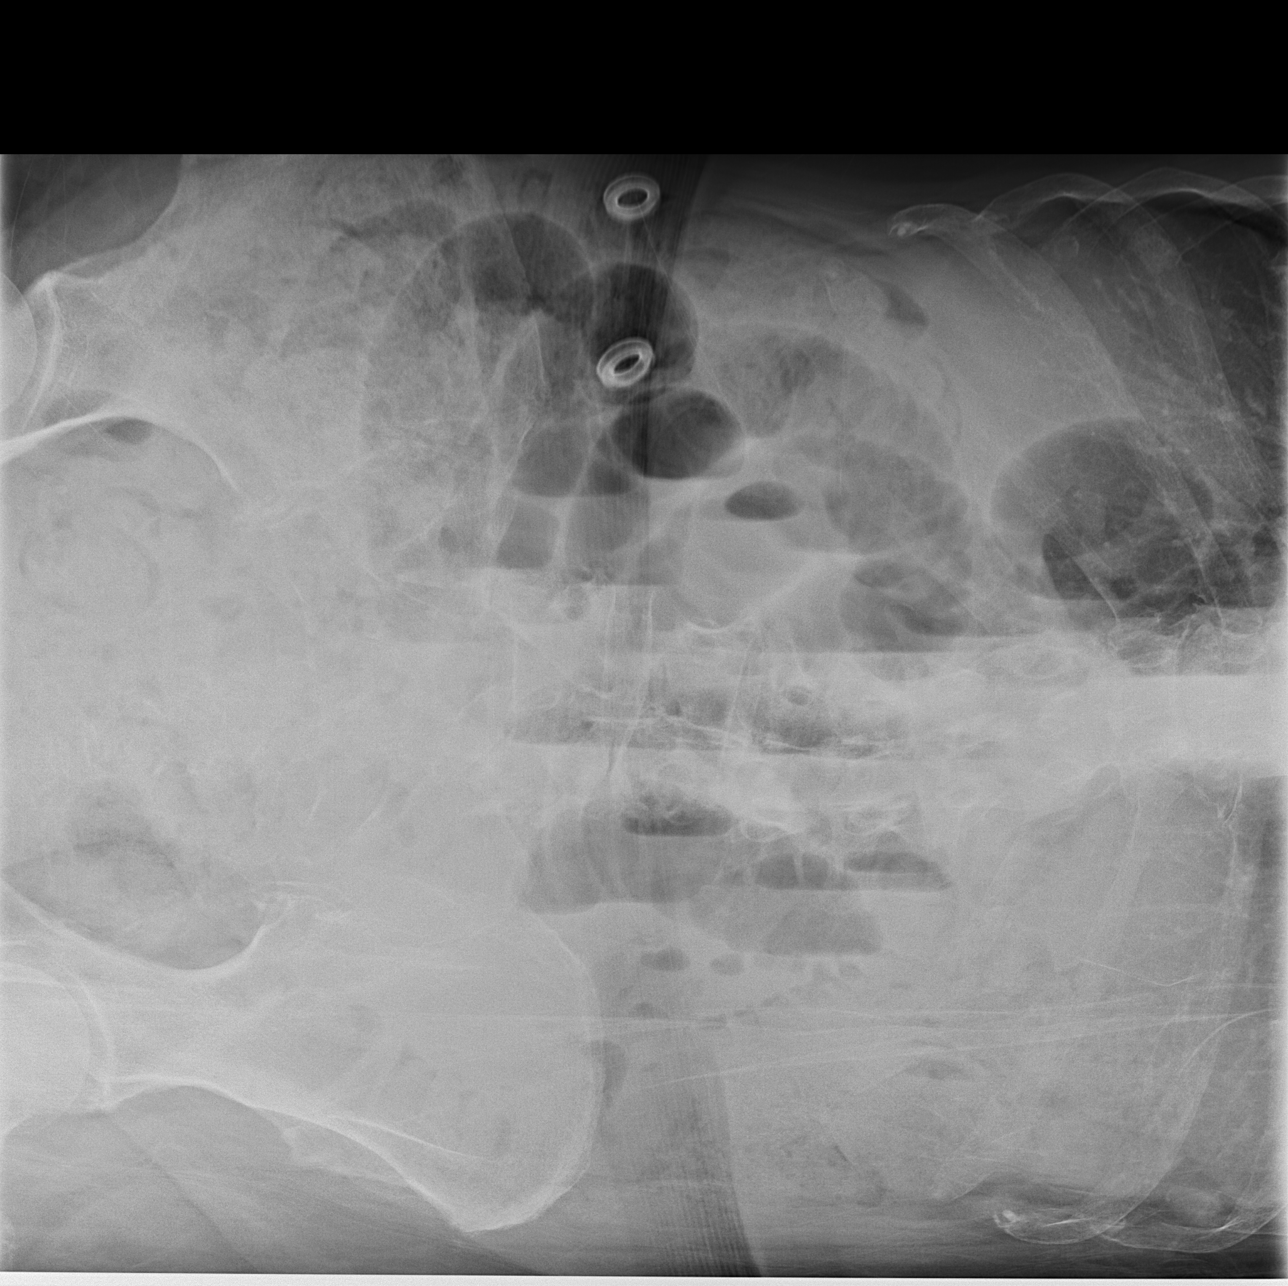

[x abdomen supine]
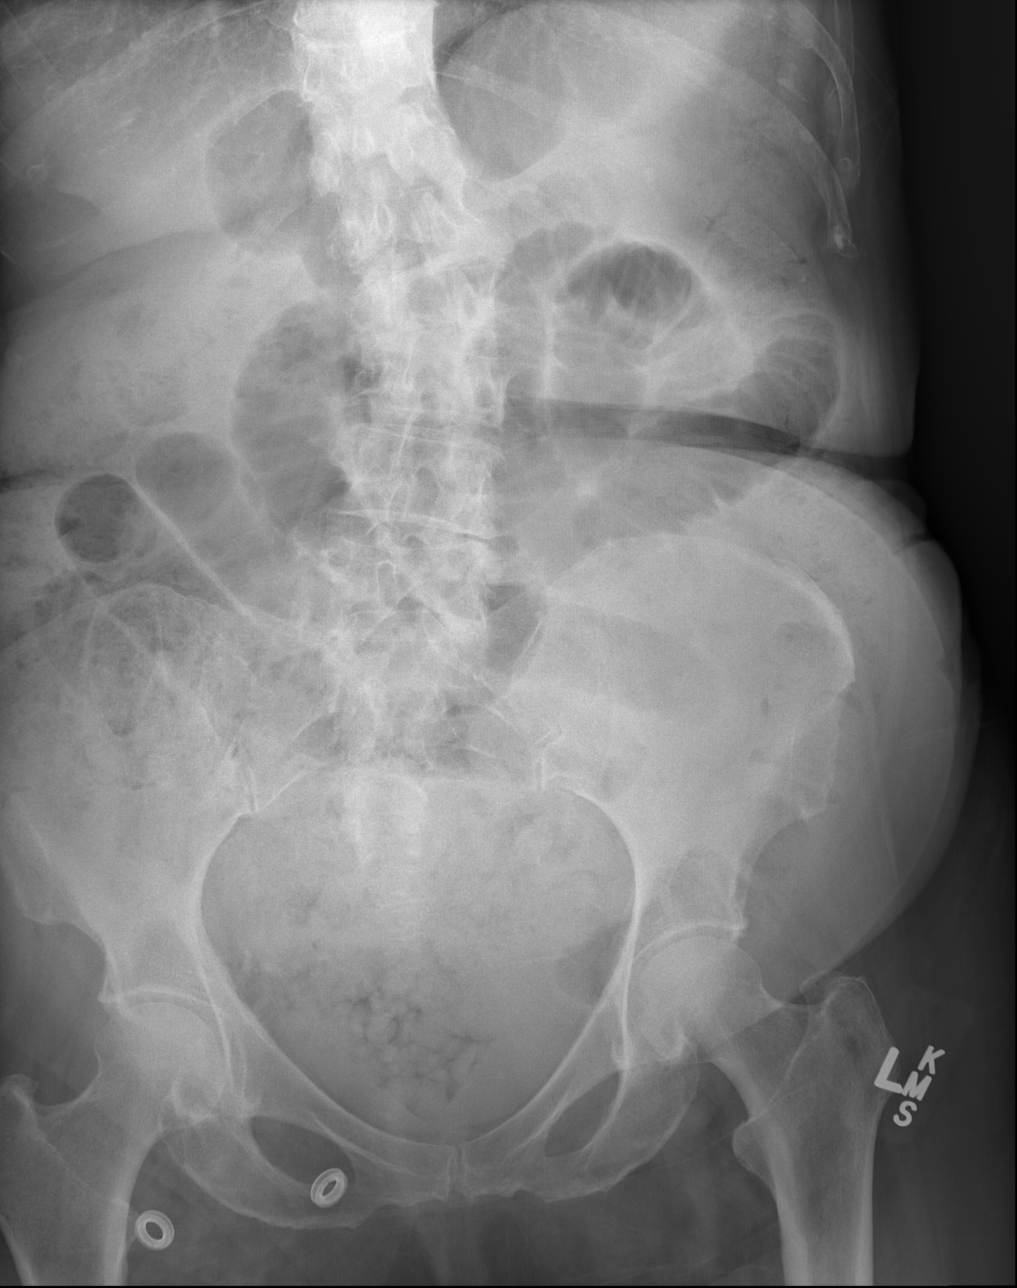

[4 of 4 positions shown; findings below may reference images not displayed]

FINDINGS: Mild enlargement of cardiac silhouette.
Atherosclerotic ossification aorta.
Mediastinal contours and pulmonary vascularity otherwise normal.
Emphysematous and bronchitic changes consistent with COPD.
Partially calcified anterior left upper lobe nodule, little
changed.
No acute infiltrate, pleural effusion, or pneumothorax.

Bones appear demineralized.
Dilated air-filled small bowel loops with scattered air-fluid
levels on left lateral decubitus view question small bowel
obstruction.
Scattered stool present throughout the colon to the rectum.
No definite free intraperitoneal air or definite urinary tract
calcification.
IMPRESSION: Dilated air-filled loops of small bowel with scattered air-fluid
levels on left lateral decubitus view question small bowel
obstruction.
However, stool is present throughout colon to rectum.
Question COPD with old calcified granuloma left upper lobe.

## 2014-04-14 ENCOUNTER — Encounter (HOSPITAL_COMMUNITY): Payer: Self-pay

## 2014-11-27 DEATH — deceased
